# Patient Record
Sex: Female | Born: 1995
Health system: Southern US, Community
[De-identification: ages and names within clinical notes are randomized; demographics above are authoritative.]

## PROBLEM LIST (undated history)

## (undated) ENCOUNTER — Inpatient Hospital Stay (HOSPITAL_COMMUNITY): Payer: Self-pay

## (undated) VITALS — BP 106/69 | HR 102 | Temp 98.3°F | Resp 16 | Ht 63.0 in | Wt 128.7 lb

## (undated) DIAGNOSIS — F32A Depression, unspecified: Secondary | ICD-10-CM

## (undated) DIAGNOSIS — D649 Anemia, unspecified: Secondary | ICD-10-CM

## (undated) DIAGNOSIS — J302 Other seasonal allergic rhinitis: Secondary | ICD-10-CM

## (undated) DIAGNOSIS — G43909 Migraine, unspecified, not intractable, without status migrainosus: Secondary | ICD-10-CM

## (undated) DIAGNOSIS — K900 Celiac disease: Secondary | ICD-10-CM

## (undated) DIAGNOSIS — L723 Sebaceous cyst: Secondary | ICD-10-CM

## (undated) DIAGNOSIS — J0301 Acute recurrent streptococcal tonsillitis: Secondary | ICD-10-CM

## (undated) DIAGNOSIS — N809 Endometriosis, unspecified: Secondary | ICD-10-CM

## (undated) DIAGNOSIS — H539 Unspecified visual disturbance: Secondary | ICD-10-CM

## (undated) DIAGNOSIS — K589 Irritable bowel syndrome without diarrhea: Secondary | ICD-10-CM

## (undated) DIAGNOSIS — F419 Anxiety disorder, unspecified: Secondary | ICD-10-CM

## (undated) DIAGNOSIS — L732 Hidradenitis suppurativa: Secondary | ICD-10-CM

## (undated) DIAGNOSIS — F329 Major depressive disorder, single episode, unspecified: Secondary | ICD-10-CM

## (undated) DIAGNOSIS — K859 Acute pancreatitis without necrosis or infection, unspecified: Secondary | ICD-10-CM

## (undated) HISTORY — DX: Acute recurrent streptococcal tonsillitis: J03.01

## (undated) HISTORY — DX: Migraine, unspecified, not intractable, without status migrainosus: G43.909

## (undated) HISTORY — DX: Irritable bowel syndrome, unspecified: K58.9

## (undated) HISTORY — DX: Anemia, unspecified: D64.9

## (undated) HISTORY — DX: Other seasonal allergic rhinitis: J30.2

## (undated) HISTORY — PX: COLONOSCOPY: SHX174

---
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
## 2016-01-19 ENCOUNTER — Emergency Department (HOSPITAL_COMMUNITY)
Admission: EM | Admit: 2016-01-19 | Discharge: 2016-01-19 | Discharge status: Absent without leave | Payer: 59 | Source: Home / Self Care

## 2016-01-19 DIAGNOSIS — R42 Dizziness and giddiness: Secondary | ICD-10-CM | POA: Diagnosis not present

## 2016-01-19 DIAGNOSIS — R5383 Other fatigue: Secondary | ICD-10-CM | POA: Diagnosis not present

## 2016-02-06 DIAGNOSIS — Z113 Encounter for screening for infections with a predominantly sexual mode of transmission: Secondary | ICD-10-CM | POA: Diagnosis not present

## 2016-02-06 DIAGNOSIS — Z0001 Encounter for general adult medical examination with abnormal findings: Secondary | ICD-10-CM | POA: Diagnosis not present

## 2016-02-06 DIAGNOSIS — K5900 Constipation, unspecified: Secondary | ICD-10-CM | POA: Diagnosis not present

## 2016-02-06 DIAGNOSIS — Z118 Encounter for screening for other infectious and parasitic diseases: Secondary | ICD-10-CM | POA: Diagnosis not present

## 2016-02-06 DIAGNOSIS — G43909 Migraine, unspecified, not intractable, without status migrainosus: Secondary | ICD-10-CM | POA: Diagnosis not present

## 2016-02-06 DIAGNOSIS — F419 Anxiety disorder, unspecified: Secondary | ICD-10-CM | POA: Diagnosis not present

## 2016-02-06 DIAGNOSIS — D509 Iron deficiency anemia, unspecified: Secondary | ICD-10-CM | POA: Diagnosis not present

## 2016-02-28 ENCOUNTER — Ambulatory Visit: Payer: 59 | Admitting: Neurology

## 2016-02-28 ENCOUNTER — Telehealth: Payer: Self-pay | Admitting: Neurology

## 2016-02-28 NOTE — Telephone Encounter (Signed)
This patient did not show for a new patient appointment today. 

## 2016-02-29 ENCOUNTER — Encounter: Payer: Self-pay | Admitting: Neurology

## 2016-03-27 ENCOUNTER — Emergency Department (HOSPITAL_COMMUNITY): Payer: 59

## 2016-03-27 ENCOUNTER — Emergency Department (HOSPITAL_COMMUNITY)
Admission: EM | Admit: 2016-03-27 | Discharge: 2016-03-27 | Disposition: A | Discharge status: Home / Self Care | Payer: 59 | Attending: Emergency Medicine | Admitting: Emergency Medicine

## 2016-03-27 ENCOUNTER — Encounter (HOSPITAL_COMMUNITY): Payer: Self-pay | Admitting: Emergency Medicine

## 2016-03-27 DIAGNOSIS — R42 Dizziness and giddiness: Secondary | ICD-10-CM | POA: Diagnosis not present

## 2016-03-27 DIAGNOSIS — R079 Chest pain, unspecified: Secondary | ICD-10-CM | POA: Diagnosis not present

## 2016-03-27 DIAGNOSIS — Z8659 Personal history of other mental and behavioral disorders: Secondary | ICD-10-CM | POA: Insufficient documentation

## 2016-03-27 DIAGNOSIS — Z8669 Personal history of other diseases of the nervous system and sense organs: Secondary | ICD-10-CM | POA: Diagnosis not present

## 2016-03-27 DIAGNOSIS — Z79899 Other long term (current) drug therapy: Secondary | ICD-10-CM | POA: Diagnosis not present

## 2016-03-27 DIAGNOSIS — R000 Tachycardia, unspecified: Secondary | ICD-10-CM | POA: Insufficient documentation

## 2016-03-27 LAB — CBC
HCT: 36.8 % (ref 36.0–46.0)
HEMOGLOBIN: 11.3 g/dL — AB (ref 12.0–15.0)
MCH: 23.8 pg — AB (ref 26.0–34.0)
MCHC: 30.7 g/dL (ref 30.0–36.0)
MCV: 77.6 fL — AB (ref 78.0–100.0)
Platelets: 301 10*3/uL (ref 150–400)
RBC: 4.74 MIL/uL (ref 3.87–5.11)
RDW: 17.3 % — ABNORMAL HIGH (ref 11.5–15.5)
WBC: 4.2 10*3/uL (ref 4.0–10.5)

## 2016-03-27 LAB — BASIC METABOLIC PANEL
Anion gap: 14 (ref 5–15)
BUN: 11 mg/dL (ref 6–20)
CHLORIDE: 106 mmol/L (ref 101–111)
CO2: 20 mmol/L — AB (ref 22–32)
CREATININE: 1.02 mg/dL — AB (ref 0.44–1.00)
Calcium: 9.5 mg/dL (ref 8.9–10.3)
GFR calc non Af Amer: >60 mL/min (ref >60)
Glucose, Bld: 71 mg/dL (ref 65–99)
Potassium: 3.5 mmol/L (ref 3.5–5.1)
Sodium: 140 mmol/L (ref 135–145)

## 2016-03-27 LAB — I-STAT TROPONIN, ED: Troponin i, poc: 0 ng/mL (ref 0.00–0.08)

## 2016-03-27 LAB — TYPE AND SCREEN
ABO/RH(D): O POS
ANTIBODY SCREEN: NEGATIVE

## 2016-03-27 LAB — D-DIMER, QUANTITATIVE (NOT AT ARMC): D DIMER QUANT: 0.3 ug{FEU}/mL (ref 0.00–0.50)

## 2016-03-27 LAB — ABO/RH: ABO/RH(D): O POS

## 2016-03-27 MED ORDER — SODIUM CHLORIDE 0.9 % IV BOLUS (SEPSIS)
1000.0000 mL | Freq: Once | INTRAVENOUS | Status: AC
  Administered 2016-03-27: 1000 mL via INTRAVENOUS

## 2016-03-27 MED ORDER — LORAZEPAM 2 MG/ML IJ SOLN
0.5000 mg | Freq: Once | INTRAMUSCULAR | Status: AC
  Administered 2016-03-27: 0.5 mg via INTRAVENOUS
  Filled 2016-03-27: qty 1

## 2016-03-27 NOTE — Discharge Instructions (Signed)
All the results in the ER are normal, labs and imaging. We are not sure what is causing your symptoms. The workup in the ER is not complete, and is limited to screening for life threatening and emergent conditions only, so please see a primary care doctor for further evaluation.  Please return to the ER if you have worsening chest pain, shortness of breath, pain radiating to your jaw, shoulder, or back, sweats or fainting. Otherwise see the Cardiologist or your primary care doctor as requested.    Dizziness Dizziness is a common problem. It is a feeling of unsteadiness or light-headedness. You may feel like you are about to faint. Dizziness can lead to injury if you stumble or fall. Anyone can become dizzy, but dizziness is more common in older adults. This condition can be caused by a number of things, including medicines, dehydration, or illness. HOME CARE INSTRUCTIONS Taking these steps may help with your condition: Eating and Drinking  Drink enough fluid to keep your urine clear or pale yellow. This helps to keep you from becoming dehydrated. Try to drink more clear fluids, such as water.  Do not drink alcohol.  Limit your caffeine intake if directed by your health care provider.  Limit your salt intake if directed by your health care provider. Activity  Avoid making quick movements.  Rise slowly from chairs and steady yourself until you feel okay.  In the morning, first sit up on the side of the bed. When you feel okay, stand slowly while you hold onto something until you know that your balance is fine.  Move your legs often if you need to stand in one place for a long time. Tighten and relax your muscles in your legs while you are standing.  Do not drive or operate heavy machinery if you feel dizzy.  Avoid bending down if you feel dizzy. Place items in your home so that they are easy for you to reach without leaning over. Lifestyle  Do not use any tobacco products,  including cigarettes, chewing tobacco, or electronic cigarettes. If you need help quitting, ask your health care provider.  Try to reduce your stress level, such as with yoga or meditation. Talk with your health care provider if you need help. General Instructions  Watch your dizziness for any changes.  Take medicines only as directed by your health care provider. Talk with your health care provider if you think that your dizziness is caused by a medicine that you are taking.  Tell a friend or a family member that you are feeling dizzy. If he or she notices any changes in your behavior, have this person call your health care provider.  Keep all follow-up visits as directed by your health care provider. This is important. SEEK MEDICAL CARE IF:  Your dizziness does not go away.  Your dizziness or light-headedness gets worse.  You feel nauseous.  You have reduced hearing.  You have new symptoms.  You are unsteady on your feet or you feel like the room is spinning. SEEK IMMEDIATE MEDICAL CARE IF:  You vomit or have diarrhea and are unable to eat or drink anything.  You have problems talking, walking, swallowing, or using your arms, hands, or legs.  You feel generally weak.  You are not thinking clearly or you have trouble forming sentences. It may take a friend or family member to notice this.  You have chest pain, abdominal pain, shortness of breath, or sweating.  Your vision changes.  You  notice any bleeding.  You have a headache.  You have neck pain or a stiff neck.  You have a fever.   This information is not intended to replace advice given to you by your health care provider. Make sure you discuss any questions you have with your health care provider.   Document Released: 05/01/2001 Document Revised: 03/22/2015 Document Reviewed: 11/01/2014 Elsevier Interactive Patient Education [employer]

## 2016-03-27 NOTE — ED Notes (Signed)
Pt verbalized understanding of d/c instructions and follow-up care. No further questions/concerns, VSS, ambulatory w/ steady gait (refused wheelchair) 

## 2016-03-27 NOTE — ED Notes (Signed)
Pt at xray

## 2016-03-27 NOTE — ED Notes (Signed)
Pt now endorses Cp yesterday and SOB today.

## 2016-03-27 NOTE — ED Provider Notes (Signed)
CSN: [id number]     Arrival date & time 03/27/16  1204 History   First MD Initiated Contact with Patient 03/27/16 1235     Chief Complaint  Patient presents with  . Dizziness     (Consider location/radiation/quality/duration/timing/severity/associated sxs/prior Treatment) Patient is a 20 y.o. female presenting with dizziness.  Dizziness Quality:  Lightheadedness Severity:  Moderate Onset quality:  Gradual Duration:  2 days Timing:  Intermittent Chronicity:  New Relieved by:  None tried Worsened by:  Nothing Ineffective treatments:  None tried Associated symptoms: chest pain   Associated symptoms: no blood in stool, no nausea, no palpitations and no shortness of breath     Past Medical History  Diagnosis Date  . Allergy     Seasonal environmental allergies  . Headache(784.0)     daily for the past two weeks, 1-2 times weekly prior to that.  . Vision abnormalities     Myopia; patient has history of devloping ulcers when she wore contacts.  . Anxiety 01/28/2013  . Depression    History reviewed. No pertinent past surgical history. Family History  Problem Relation Age of Onset  . Depression Mother   . Migraines Mother   . Migraines Father    Social History  Substance Use Topics  . Smoking status: Never Smoker   . Smokeless tobacco: Never Used  . Alcohol Use: No   OB History    No data available     Review of Systems  Respiratory: Negative for shortness of breath.   Cardiovascular: Positive for chest pain. Negative for palpitations and leg swelling.  Gastrointestinal: Negative for nausea and blood in stool.  Genitourinary: Negative for flank pain and enuresis.  Neurological: Positive for dizziness.  All other systems reviewed and are negative.     Allergies  Review of patient's allergies indicates no known allergies.  Home Medications   Prior to Admission medications   Medication Sig Start Date End Date Taking? Authorizing Provider  ferrous sulfate 325  (65 FE) MG tablet Take 1 tablet (325 mg total) by mouth daily with breakfast. 02/03/13  Yes Jolene SchimkeKim B Winson, NP  ibuprofen (ADVIL,MOTRIN) 600 MG tablet Take 1 tablet (600 mg total) by mouth every 6 (six) hours as needed for cramping. 08/12/14  Yes Morton StallElyse Smith, MD   BP 116/58 mmHg  Pulse 114  Temp(Src) 98.2 F (36.8 C) (Oral)  Resp 23  SpO2 100%  LMP 03/27/2016 Physical Exam  Constitutional: She is oriented to person, place, and time. She appears well-developed and well-nourished.  HENT:  Head: Normocephalic and atraumatic.  Neck: Normal range of motion.  Cardiovascular: Regular rhythm.  Tachycardia present.   Pulmonary/Chest: Effort normal. No stridor. No respiratory distress. She has no wheezes.  Abdominal: Soft. She exhibits no distension.  Musculoskeletal: Normal range of motion. She exhibits no edema.  Neurological: She is alert and oriented to person, place, and time. No cranial nerve deficit.  No altered mental status, able to give full seemingly accurate history.  Face is symmetric, EOM's intact, pupils equal and reactive, vision intact, tongue and uvula midline without deviation Upper and Lower extremity motor 5/5, intact pain perception in distal extremities, 2+ reflexes in biceps, patella and achilles tendons. Finger to nose normal, heel to shin normal. Walks without assistance or evident ataxia.   Skin: Skin is warm and dry.  Nursing note and vitals reviewed.   ED Course  Procedures (including critical care time) Labs Review Labs Reviewed  BASIC METABOLIC PANEL - Abnormal; Notable for the  following:    CO2 20 (*)    Creatinine, Ser 1.02 (*)    All other components within normal limits  CBC - Abnormal; Notable for the following:    Hemoglobin 11.3 (*)    MCV 77.6 (*)    MCH 23.8 (*)    RDW 17.3 (*)    All other components within normal limits  D-DIMER, QUANTITATIVE (NOT AT [facility])  I-STAT TROPOININ, ED  TYPE AND SCREEN  [facility]    Imaging Review Dg Chest 2  View  03/27/2016  CLINICAL DATA:  Chest Pain yesterday ; she randomly gets SOB EXAM: CHEST - 2 VIEW COMPARISON:  None available FINDINGS: Lungs are clear. Heart size and mediastinal contours are within normal limits. No effusion. Visualized skeletal structures are unremarkable.  Nipple hardware. IMPRESSION: No acute cardiopulmonary disease. Electronically Signed   By: Corlis Leak M.D.   On: 03/27/2016 12:50   I have personally reviewed and evaluated these images and lab results as part of my medical decision-making.   EKG Interpretation   Date/Time:  Tuesday Mar 27 2016 12:14:05 EDT Ventricular Rate:  150 PR Interval:  114 QRS Duration: 64 QT Interval:  266 QTC Calculation: 420 R Axis:   88 Text Interpretation:  Sinus tachycardia Nonspecific ST and T wave  abnormality Abnormal ECG Confirmed by [facility] MD, Barbara Cower [phone]) on 03/27/2016  12:35:06 PM      MDM   Final diagnoses:  None   Light headedness. Likely 2/2 dehydration v anxiety. Will treat both and reevaluate.   Persistent tachycardia, will add on d dimer.     Marily Memos, MD 03/28/16 1055

## 2016-03-27 NOTE — ED Notes (Signed)
Pt c/o lightheaded and dizziness since yesterday. Pt states shes anemic. Pt HR is 120 at this time, ambulatory in triage.

## 2016-06-13 ENCOUNTER — Encounter: Payer: Self-pay | Admitting: Pediatrics

## 2016-06-14 ENCOUNTER — Encounter: Payer: Self-pay | Admitting: Pediatrics

## 2016-07-26 DIAGNOSIS — R079 Chest pain, unspecified: Secondary | ICD-10-CM | POA: Diagnosis not present

## 2016-07-26 DIAGNOSIS — N946 Dysmenorrhea, unspecified: Secondary | ICD-10-CM | POA: Diagnosis not present

## 2016-07-26 DIAGNOSIS — Z118 Encounter for screening for other infectious and parasitic diseases: Secondary | ICD-10-CM | POA: Diagnosis not present

## 2016-07-26 DIAGNOSIS — D649 Anemia, unspecified: Secondary | ICD-10-CM | POA: Diagnosis not present

## 2016-07-26 DIAGNOSIS — R51 Headache: Secondary | ICD-10-CM | POA: Diagnosis not present

## 2016-07-26 MED FILL — AMITRIPTYLINE HCL 10 MG TAB: 10 | 30 days supply | Qty: 60 | Fill #0

## 2016-07-26 MED FILL — IBUPROFEN 600 MG TABLET: 600 | 30 days supply | Qty: 90 | Fill #0

## 2016-07-31 MED FILL — AZITHROMYCIN 500 MG TABLET: 500 | 1 days supply | Qty: 2 | Fill #0

## 2016-08-28 ENCOUNTER — Ambulatory Visit: Payer: 59 | Admitting: Neurology

## 2016-08-28 ENCOUNTER — Telehealth: Payer: Self-pay | Admitting: *Deleted

## 2016-08-28 NOTE — Telephone Encounter (Signed)
No showed new patient appointment. 

## 2016-08-29 ENCOUNTER — Encounter: Payer: Self-pay | Admitting: Neurology

## 2016-12-12 DIAGNOSIS — J069 Acute upper respiratory infection, unspecified: Secondary | ICD-10-CM | POA: Diagnosis not present

## 2016-12-12 MED FILL — OSELTAMIVIR PHOS 75 MG CAP: 75 | 5 days supply | Qty: 10 | Fill #0

## 2016-12-17 DIAGNOSIS — R0789 Other chest pain: Secondary | ICD-10-CM | POA: Diagnosis not present

## 2016-12-17 DIAGNOSIS — J029 Acute pharyngitis, unspecified: Secondary | ICD-10-CM | POA: Diagnosis not present

## 2016-12-17 MED FILL — AZITHROMYCIN 250 MG TABLET: 250 | 5 days supply | Qty: 6 | Fill #0

## 2016-12-17 MED FILL — IBUPROFEN 800 MG TABLET: 800 | 15 days supply | Qty: 45 | Fill #0

## 2016-12-18 DIAGNOSIS — R05 Cough: Secondary | ICD-10-CM | POA: Diagnosis not present

## 2016-12-18 DIAGNOSIS — J029 Acute pharyngitis, unspecified: Secondary | ICD-10-CM | POA: Diagnosis not present

## 2016-12-18 DIAGNOSIS — B349 Viral infection, unspecified: Secondary | ICD-10-CM | POA: Diagnosis not present

## 2016-12-18 MED FILL — predniSONE 20 MG TABS: 20 | 5 days supply | Qty: 10 | Fill #0

## 2016-12-18 MED FILL — IPRATROPIUM 0.06% SPRAY: 0.06 | 20 days supply | Qty: 15 | Fill #0

## 2016-12-18 MED FILL — MAGIC MOUTHWASH 1:1:1: 10 days supply | Qty: 180 | Fill #0

## 2016-12-18 MED FILL — BROMIPHENIR-PSEUDOEPHED-DM: 30-2-10 | 5 days supply | Qty: 180 | Fill #0

## 2017-01-01 DIAGNOSIS — R197 Diarrhea, unspecified: Secondary | ICD-10-CM | POA: Diagnosis not present

## 2017-01-01 DIAGNOSIS — K625 Hemorrhage of anus and rectum: Secondary | ICD-10-CM | POA: Diagnosis not present

## 2017-01-18 MED FILL — GAVILYTE-N SOLUTION: 420 | 1 days supply | Qty: 4000 | Fill #0

## 2017-01-21 DIAGNOSIS — K640 First degree hemorrhoids: Secondary | ICD-10-CM | POA: Diagnosis not present

## 2017-01-21 DIAGNOSIS — R197 Diarrhea, unspecified: Secondary | ICD-10-CM | POA: Diagnosis not present

## 2017-01-21 DIAGNOSIS — K625 Hemorrhage of anus and rectum: Secondary | ICD-10-CM | POA: Diagnosis not present

## 2017-03-14 DIAGNOSIS — B338 Other specified viral diseases: Secondary | ICD-10-CM | POA: Diagnosis not present

## 2017-03-14 DIAGNOSIS — R509 Fever, unspecified: Secondary | ICD-10-CM | POA: Diagnosis not present

## 2017-03-21 DIAGNOSIS — N760 Acute vaginitis: Secondary | ICD-10-CM | POA: Diagnosis not present

## 2017-03-21 DIAGNOSIS — Z113 Encounter for screening for infections with a predominantly sexual mode of transmission: Secondary | ICD-10-CM | POA: Diagnosis not present

## 2017-03-21 DIAGNOSIS — Z6822 Body mass index (BMI) 22.0-22.9, adult: Secondary | ICD-10-CM | POA: Diagnosis not present

## 2017-03-21 DIAGNOSIS — Z01419 Encounter for gynecological examination (general) (routine) without abnormal findings: Secondary | ICD-10-CM | POA: Diagnosis not present

## 2017-03-21 MED FILL — metroNIDAZOLE 0.75 % GEL: 0.75 | 5 days supply | Qty: 70 | Fill #0

## 2017-03-21 MED FILL — AZITHROMYCIN 500 MG TABLET: 500 | 1 days supply | Qty: 2 | Fill #0

## 2017-03-21 MED FILL — FLUCONAZOLE 150 MG TABLET: 150 | 1 days supply | Qty: 1 | Fill #0

## 2017-06-28 DIAGNOSIS — J029 Acute pharyngitis, unspecified: Secondary | ICD-10-CM | POA: Diagnosis not present

## 2017-06-28 DIAGNOSIS — J039 Acute tonsillitis, unspecified: Secondary | ICD-10-CM | POA: Diagnosis not present

## 2017-06-28 DIAGNOSIS — J020 Streptococcal pharyngitis: Secondary | ICD-10-CM | POA: Diagnosis not present

## 2017-06-28 MED FILL — AMOXICILLIN 875 MG TABLET: 875 | 10 days supply | Qty: 20 | Fill #0

## 2017-08-15 MED FILL — MAGIC MOUTHWASH W/LIDO 1:1: 5 days supply | Qty: 120 | Fill #0

## 2017-08-16 ENCOUNTER — Encounter: Payer: Self-pay | Admitting: Primary Care

## 2017-08-16 ENCOUNTER — Ambulatory Visit (INDEPENDENT_AMBULATORY_CARE_PROVIDER_SITE_OTHER): Payer: 59 | Admitting: Primary Care

## 2017-08-16 VITALS — BP 102/62 | HR 98 | Temp 98.2°F | Ht 63.25 in | Wt 123.8 lb

## 2017-08-16 DIAGNOSIS — F411 Generalized anxiety disorder: Secondary | ICD-10-CM

## 2017-08-16 DIAGNOSIS — J029 Acute pharyngitis, unspecified: Secondary | ICD-10-CM

## 2017-08-16 DIAGNOSIS — F322 Major depressive disorder, single episode, severe without psychotic features: Secondary | ICD-10-CM

## 2017-08-16 DIAGNOSIS — G43701 Chronic migraine without aura, not intractable, with status migrainosus: Secondary | ICD-10-CM | POA: Diagnosis not present

## 2017-08-16 LAB — POCT RAPID STREP A (OFFICE): Rapid Strep A Screen: NEGATIVE

## 2017-08-16 MED ORDER — METHYLPREDNISOLONE ACETATE 80 MG/ML IJ SUSP
80.0000 mg | Freq: Once | INTRAMUSCULAR | Status: AC
  Administered 2017-08-16: 80 mg via INTRAMUSCULAR

## 2017-08-16 NOTE — Assessment & Plan Note (Signed)
Diagnosed at age 21, didn't take Lexapro very long.  Overall manages well,. Discussed to notify if symptoms progress.  

## 2017-08-16 NOTE — Assessment & Plan Note (Signed)
Diagnosed at age 21, didn't take Lexapro very long.  Overall manages well,. Discussed to notify if symptoms progress.

## 2017-08-16 NOTE — Progress Notes (Signed)
Subjective:    Patient ID: Stephanie [name], female    DOB: 1996/06/18, 21 y.o.   MRN: [id number]  HPI  Stephanie [name] is a 21 year old female who presents today to establish care and discuss the problems mentioned below. Will obtain old records.  1) Major Depressive Disorder/GAD: Diagnosed years ago at age 20. She was once managed on Lexapro but didn't like the way it made her feel so she stopped taking it. Overall she manages well, but does experience both anxiety and depression symptoms.   2) Migraine Headaches: Diagnosed at age 81. Experiences migraines two to three times weekly on average. She will wake up with a headache which will last throughout the day. She will experience photophobia and nausea with migraines. She will take Ibuprofen which sometimes helps. She thinks she was once on medications to prevent medications, but is not sure.   3) Sore Throat: History of recurrent strep pharyngitis with tonsillitis. Symptoms of difficulty swallowing, body aches, headaches, fever of 100, night sweats, cough, weight loss of 10 pounds due to inability to swallow that began 2 days ago. She was treated at her [facility] who gave her Magic Mouthwash and Ibuprofen. She started to develop an appetite and was able to eat yesterday. Overall her throat has improved.   Review of Systems     Past Medical History:  Diagnosis Date  . Anxiety 01/28/2013  . Depression   . IBS (irritable bowel syndrome)   . Migraines    daily for the past two weeks, 1-2 times weekly prior to that.  . Recurrent streptococcal tonsillitis   . Seasonal allergies    Seasonal environmental allergies  . Vision abnormalities    Myopia; patient has history of devloping ulcers when she wore contacts.     Social History   Social History  . Marital status: Single    Spouse name: N/A  . Number of children: N/A  . Years of education: N/A   Occupational History  . Not on file.   Social History Main Topics  .  Smoking status: Never Smoker  . Smokeless tobacco: Never Used  . Alcohol use No  . Drug use: No  . Sexual activity: Yes    Partners: Male    Birth control/ protection: Implant     Comment: broke up with first sexual partner 1 month ago   Other Topics Concern  . Not on file   Social History Narrative   Single.    Student at [employer].   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    Enjoys listening to music, hanging out with friends.     No past surgical history on file.  Family History  Problem Relation Age of Onset  . Depression Mother   . Migraines Mother   . Migraines Father     No Known Allergies  Current Outpatient Prescriptions on File Prior to Visit  Medication Sig Dispense Refill  . ibuprofen (ADVIL,MOTRIN) 600 MG tablet Take 1 tablet (600 mg total) by mouth every 6 (six) hours as needed for cramping. 30 tablet 0   No current facility-administered medications on file prior to visit.     BP 102/62   Pulse 98   Temp 98.2 F (36.8 C) (Oral)   Ht 5' 3.25" (1.607 m)   Wt 123 lb 12.8 oz (56.2 kg)   LMP 07/29/2017   SpO2 99%   BMI 21.76 kg/m    Objective:   Physical Exam  Constitutional: She appears  well-nourished.  HENT:  Right Ear: Tympanic membrane and ear canal normal.  Left Ear: Tympanic membrane and ear canal normal.  Nose: Right sinus exhibits no maxillary sinus tenderness and no frontal sinus tenderness. Left sinus exhibits no maxillary sinus tenderness and no frontal sinus tenderness.  Mouth/Throat: Posterior oropharyngeal edema and posterior oropharyngeal erythema present. No oropharyngeal exudate.  Eyes: Conjunctivae are normal.  Neck: Neck supple.  Cardiovascular: Normal rate and regular rhythm.   Pulmonary/Chest: Effort normal and breath sounds normal. She has no wheezes. She has no rales.  Lymphadenopathy:    She has no cervical adenopathy.  Skin: Skin is warm and dry.  Mildly diaphoretic          Assessment & Plan:  Viral  Pharyngitis:  Sore throat, throat swelling, low grade fever x 2 days. Exam today without obvious strep involvement. Rapid Strep: Negative. Overall is feeling better, vitals normal, appears well. Im Depo Medrol provided for throat inflammation. Continue Tylenol/Ibuprofen PRN.  Morrie Sheldon, NP

## 2017-08-16 NOTE — Addendum Note (Signed)
Addended by: Tawnya Crook on: 08/16/2017 10:27 AM   Modules accepted: Orders

## 2017-08-16 NOTE — Assessment & Plan Note (Signed)
Diagnosed at age 21, overall less frequent with migraines 2-3 times weekly. Overall able to manage. She will notify us if/when ready for preventative treatment.

## 2017-08-16 NOTE — Patient Instructions (Signed)
You were provided with an injection of steroids for throat swelling.  Start eating more meals that include healthy foods.  Increase vegetables, fruit, whole grains, lean protein. Start drinking a protein shake once daily for nutrients.  Ensure you are consuming 64 ounces of water daily.  Continue Ibuprofen/tylenol as needed for pain.  It was a pleasure to meet you today! Please don't hesitate to call me with any questions. Welcome to [facility]!

## 2017-08-28 ENCOUNTER — Ambulatory Visit: Payer: 59 | Admitting: Primary Care

## 2017-10-06 DIAGNOSIS — H5213 Myopia, bilateral: Secondary | ICD-10-CM | POA: Diagnosis not present

## 2017-10-15 DIAGNOSIS — L02422 Furuncle of left axilla: Secondary | ICD-10-CM | POA: Diagnosis not present

## 2017-10-17 DIAGNOSIS — Z3046 Encounter for surveillance of implantable subdermal contraceptive: Secondary | ICD-10-CM | POA: Diagnosis not present

## 2017-11-13 ENCOUNTER — Telehealth: Payer: Self-pay | Admitting: Primary Care

## 2017-11-13 NOTE — Telephone Encounter (Signed)
Copied from CRM [id number]. Topic: Quick Communication - See Telephone Encounter >> Nov 13, 2017 11:38 AM Stephanie [name], Rosey Batheresa D wrote: CRM for notification. See Telephone encounter for: 11/13/17. Patient needs a staff medical form sign and needs it done by today. Please call patient back if Clark or someone in the office can do it other then her pcp, thanks.

## 2017-11-13 NOTE — Telephone Encounter (Signed)
I left v/m for pt to cb to see what type form pt has.

## 2017-11-13 NOTE — Telephone Encounter (Signed)
Patient dropped off form.  Form is in rx tower.  When form is ready for pick up, please call patient back at [phone].

## 2017-11-13 NOTE — Telephone Encounter (Signed)
Noted  

## 2017-11-13 NOTE — Telephone Encounter (Signed)
Patient wanted form filled out as soon as possible.  [place] and she said she needed to see patient to fill out form.  Patient said she couldn't make it to appointment on Friday and patient took the form with her.

## 2017-11-13 NOTE — Telephone Encounter (Signed)
Pt states it is a staff medical form that states she is able to care for children, emotionally and physically. Form has about 5 questions on it. Advised pt she needs to bring the form to the office

## 2017-11-20 ENCOUNTER — Encounter: Payer: Self-pay | Admitting: Primary Care

## 2017-11-20 ENCOUNTER — Encounter: Payer: Self-pay | Admitting: *Deleted

## 2017-11-20 ENCOUNTER — Ambulatory Visit (INDEPENDENT_AMBULATORY_CARE_PROVIDER_SITE_OTHER): Payer: 59 | Admitting: Primary Care

## 2017-11-20 VITALS — BP 106/70 | HR 97 | Temp 98.0°F | Ht 63.25 in | Wt 127.8 lb

## 2017-11-20 DIAGNOSIS — F322 Major depressive disorder, single episode, severe without psychotic features: Secondary | ICD-10-CM

## 2017-11-20 DIAGNOSIS — F411 Generalized anxiety disorder: Secondary | ICD-10-CM

## 2017-11-20 DIAGNOSIS — Z0000 Encounter for general adult medical examination without abnormal findings: Secondary | ICD-10-CM

## 2017-11-20 LAB — COMPREHENSIVE METABOLIC PANEL
ALT: 12 U/L (ref 0–35)
AST: 18 U/L (ref 0–37)
Albumin: 4 g/dL (ref 3.5–5.2)
Alkaline Phosphatase: 33 U/L — ABNORMAL LOW (ref 39–117)
BUN: 10 mg/dL (ref 6–23)
CALCIUM: 9 mg/dL (ref 8.4–10.5)
CO2: 27 meq/L (ref 19–32)
Chloride: 105 mEq/L (ref 96–112)
Creatinine, Ser: 0.74 mg/dL (ref 0.40–1.20)
GFR: 127.42 mL/min (ref >60.00)
GLUCOSE: 88 mg/dL (ref 70–99)
Potassium: 3.9 mEq/L (ref 3.5–5.1)
Sodium: 137 mEq/L (ref 135–145)
Total Bilirubin: 0.4 mg/dL (ref 0.2–1.2)
Total Protein: 7 g/dL (ref 6.0–8.3)

## 2017-11-20 LAB — LIPID PANEL
Cholesterol: 132 mg/dL (ref 0–200)
HDL: 39.5 mg/dL (ref >39.00)
LDL Cholesterol: 83 mg/dL (ref 0–99)
NonHDL: 92.3
TRIGLYCERIDES: 48 mg/dL (ref 0.0–149.0)
Total CHOL/HDL Ratio: 3
VLDL: 9.6 mg/dL (ref 0.0–40.0)

## 2017-11-20 LAB — TSH: TSH: 1.17 u[IU]/mL (ref 0.35–5.50)

## 2017-11-20 MED ORDER — SERTRALINE HCL 25 MG PO TABS: 25.0000 mg | ORAL_TABLET | Freq: Every day | ORAL | 1 refills | Status: DC

## 2017-11-20 NOTE — Assessment & Plan Note (Signed)
PHQ 9 score of 15 today. Discussed options for treatment, she elects for medication.   Rx for Zoloft 25 mg sent to pharmacy. Patient is to take 1/2 tablet daily for 8 days, then advance to 1 full tablet thereafter. We discussed possible side effects of headache, GI upset, drowsiness, and SI/HI. If thoughts of SI/HI develop, we discussed to present to the emergency immediately. Patient verbalized understanding.   Follow up in 6 weeks for re-evaluation.

## 2017-11-20 NOTE — Patient Instructions (Signed)
Start sertraline 25 mg tablets for anxiety and depression. Start by taking 1/2 tablet daily for 8 days, then increase to 1 full tablet thereafter. Alcohol can cause increased depression, limit use when on this medication.  Increase consumption of vegetables, fruit, whole grains.  Ensure you are consuming 64 ounces of water daily.  Stop by the lab prior to leaving today. I will notify you of your results once received.   Follow up in 6 weeks for re-evaluation or sooner if needed.  It was a pleasure to see you today!

## 2017-11-20 NOTE — Progress Notes (Signed)
Subjective:    Patient ID: Stephanie [name], female    DOB: 12-05-1995, 21 y.o.   MRN: [id number]  HPI  Stephanie [name] is a 22 year old female who presents today for complete physical.  Immunizations: -Tetanus: Completed in 2009 -Influenza: Declines    Diet: She endorses a poor diet. Breakfast: Skips Lunch: Sometimes skips, fish sticks, tater tots, pasta, pizza Dinner: Sometimes skips, sandwich Snacks: Fruit, granola bar Desserts: None Beverages: Water, ensure  Exercise: She does not currently exercise Eye exam: Completed in 2018 Dental exam: Due now, she will schedule.  Pap Smear: Due in 2019, following with gynecology.   1) Anxiety and Depression: Long history of anxiety, thinks her anxiety has increased over the last one month. She's experienced symptoms of no motivation, feeling down, irritability, feeling on edge. GAD 7 score of 14 and PHQ 9 score of 15 today. She denies SI/HI. She doesn't feel that she has time for therapy.    Review of Systems  Constitutional: Negative for unexpected weight change.  HENT: Negative for rhinorrhea.   Respiratory: Negative for cough and shortness of breath.   Cardiovascular: Negative for chest pain.  Gastrointestinal: Negative for constipation and diarrhea.  Genitourinary: Negative for difficulty urinating and menstrual problem.  Musculoskeletal: Negative for arthralgias and myalgias.  Skin: Negative for rash.  Allergic/Immunologic: Negative for environmental allergies.  Neurological: Negative for dizziness, numbness and headaches.  Psychiatric/Behavioral:       See HPI       Past Medical History:  Diagnosis Date  . Anxiety 01/28/2013  . Depression   . IBS (irritable bowel syndrome)   . Migraines    daily for the past two weeks, 1-2 times weekly prior to that.  . Recurrent streptococcal tonsillitis   . Seasonal allergies    Seasonal environmental allergies  . Vision abnormalities    Myopia; patient has history of  devloping ulcers when she wore contacts.     Social History   Socioeconomic History  . Marital status: Single    Spouse name: Not on file  . Number of children: Not on file  . Years of education: Not on file  . Highest education level: Not on file  Social Needs  . Financial resource strain: Not on file  . Food insecurity - worry: Not on file  . Food insecurity - inability: Not on file  . Transportation needs - medical: Not on file  . Transportation needs - non-medical: Not on file  Occupational History  . Not on file  Tobacco Use  . Smoking status: Never Smoker  . Smokeless tobacco: Never Used  Substance and Sexual Activity  . Alcohol use: No  . Drug use: No  . Sexual activity: Yes    Partners: Male    Birth control/protection: Implant    Comment: broke up with first sexual partner 1 month ago  Other Topics Concern  . Not on file  Social History Narrative   Single.    Student at [employer].   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    Enjoys listening to music, hanging out with friends.     No past surgical history on file.  Family History  Problem Relation Age of Onset  . Depression Mother   . Migraines Mother   . Migraines Father     No Known Allergies  Current Outpatient Medications on File Prior to Visit  Medication Sig Dispense Refill  . ibuprofen (ADVIL,MOTRIN) 600 MG tablet Take 1 tablet (600 mg total)  by mouth every 6 (six) hours as needed for cramping. (Patient not taking: Reported on 11/20/2017) 30 tablet 0   No current facility-administered medications on file prior to visit.     BP 106/70   Pulse 97   Temp 98 F (36.7 C) (Oral)   Ht 5' 3.25" (1.607 m)   Wt 127 lb 12.8 oz (58 kg)   LMP 10/02/2017   SpO2 99%   BMI 22.46 kg/m    Objective:   Physical Exam  Constitutional: She is oriented to person, place, and time. She appears well-nourished.  HENT:  Right Ear: Tympanic membrane and ear canal normal.  Left Ear: Tympanic membrane and  ear canal normal.  Nose: Nose normal.  Mouth/Throat: Oropharynx is clear and moist.  Eyes: Conjunctivae and EOM are normal. Pupils are equal, round, and reactive to light.  Neck: Neck supple. No thyromegaly present.  Cardiovascular: Normal rate and regular rhythm.  No murmur heard. Pulmonary/Chest: Effort normal and breath sounds normal. She has no rales.  Abdominal: Soft. Bowel sounds are normal. There is no tenderness.  Musculoskeletal: Normal range of motion.  Lymphadenopathy:    She has no cervical adenopathy.  Neurological: She is alert and oriented to person, place, and time. She has normal reflexes. No cranial nerve deficit.  Skin: Skin is warm. No rash noted.  Mild sweating to underarms and abdomen.  Psychiatric: She has a normal mood and affect.          Assessment & Plan:

## 2017-11-20 NOTE — Assessment & Plan Note (Signed)
GAD 7 score of 14 today. Declines therapy. Discussed options for treatment, she elects for medication.   Rx for Zoloft 25 mg sent to pharmacy. Patient is to take 1/2 tablet daily for 8 days, then advance to 1 full tablet thereafter. We discussed possible side effects of headache, GI upset, drowsiness, and SI/HI. If thoughts of SI/HI develop, we discussed to present to the emergency immediately. Patient verbalized understanding.   Follow up in 6 weeks for re-evaluation.

## 2017-11-20 NOTE — Assessment & Plan Note (Signed)
Tdap UTD, declines influenza vaccination. Pap due in 2019, following with GYN. Discussed to increase vegetables, fruit, whole grains, water. Exam unremarkable. Labs pending. Follow up in 1 year for annual exam.

## 2017-11-25 ENCOUNTER — Encounter: Payer: 59 | Admitting: Primary Care

## 2017-11-29 MED FILL — SERTRALINE HCL 25 MG TABLET: 25 | 30 days supply | Qty: 30 | Fill #0

## 2017-12-05 ENCOUNTER — Ambulatory Visit: Payer: Self-pay

## 2017-12-05 NOTE — Telephone Encounter (Signed)
Pt. Will take missed dose now and then continue with regular schedule.

## 2018-01-01 ENCOUNTER — Ambulatory Visit: Payer: 59 | Admitting: Primary Care

## 2018-01-06 ENCOUNTER — Encounter: Payer: Self-pay | Admitting: Primary Care

## 2018-01-06 ENCOUNTER — Ambulatory Visit (INDEPENDENT_AMBULATORY_CARE_PROVIDER_SITE_OTHER): Payer: No Typology Code available for payment source | Admitting: Primary Care

## 2018-01-06 VITALS — BP 120/76 | HR 90 | Temp 98.3°F | Ht 63.25 in | Wt 125.8 lb

## 2018-01-06 DIAGNOSIS — Z01419 Encounter for gynecological examination (general) (routine) without abnormal findings: Secondary | ICD-10-CM

## 2018-01-06 DIAGNOSIS — F322 Major depressive disorder, single episode, severe without psychotic features: Secondary | ICD-10-CM

## 2018-01-06 DIAGNOSIS — F411 Generalized anxiety disorder: Secondary | ICD-10-CM | POA: Diagnosis not present

## 2018-01-06 MED ORDER — SERTRALINE HCL 25 MG PO TABS: 25.0000 mg | ORAL_TABLET | Freq: Every day | ORAL | 0 refills | Status: DC

## 2018-01-06 NOTE — Patient Instructions (Signed)
We sent refills for Zoloft to your pharmacy, continue 25 mg once daily.  Please update me in a few weeks as discussed.  It was a pleasure to see you today!

## 2018-01-06 NOTE — Assessment & Plan Note (Signed)
Overall improved on Zoloft 25 mg, does seem as though she may benefit on an increased dose. She would like to stick with 25 mg for now which is reasonable. She'll continue to monitor symptoms after reinitiating at the 25 mg dose and update as needed. Denies SI/HI. GAD 7 score of 7 today.

## 2018-01-06 NOTE — Progress Notes (Signed)
Subjective:    Patient ID: Stephanie [name], female    DOB: September 15, 1996, 22 y.o.   MRN: [id number]  HPI  [name] is a 22 year old female who presents today for follow up of anxiety and depression.   She was last evaluated in early January 2019 with complaints of increased anxiety, no motivation to do anything, feeling depressed. GAD 7 score of 14 and PHQ 9 score of 15 that day so she was initiated on Zoloft 25 mg once daily.   Since her last visit she's feeling better. Positive symptoms include improved mood, easier to get out of bed in the morning, more motivation to do things, less worry. Denies SI/HI.   She's been without Zoloft for one week as she ran out, she didn't realized she had an an extra refill. PHQ 9 score of 10 and GAD 7 score of 7 today. She started to notice a difference when off of the medication and realizes she feels better when taking.  Review of Systems  Gastrointestinal: Negative for abdominal pain and nausea.  Neurological: Negative for headaches.  Psychiatric/Behavioral: Negative for suicidal ideas.       See HPI       Past Medical History:  Diagnosis Date  . Anxiety 01/28/2013  . Depression   . IBS (irritable bowel syndrome)   . Migraines    daily for the past two weeks, 1-2 times weekly prior to that.  . Recurrent streptococcal tonsillitis   . Seasonal allergies    Seasonal environmental allergies  . Vision abnormalities    Myopia; patient has history of devloping ulcers when she wore contacts.     Social History   Socioeconomic History  . Marital status: Single    Spouse name: Not on file  . Number of children: Not on file  . Years of education: Not on file  . Highest education level: Not on file  Social Needs  . Financial resource strain: Not on file  . Food insecurity - worry: Not on file  . Food insecurity - inability: Not on file  . Transportation needs - medical: Not on file  . Transportation needs - non-medical: Not on file    Occupational History  . Not on file  Tobacco Use  . Smoking status: Never Smoker  . Smokeless tobacco: Never Used  Substance and Sexual Activity  . Alcohol use: No  . Drug use: No  . Sexual activity: Yes    Partners: Male    Birth control/protection: Implant    Comment: broke up with first sexual partner 1 month ago  Other Topics Concern  . Not on file  Social History Narrative   Single.    Student at [employer].   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    Enjoys listening to music, hanging out with friends.     No past surgical history on file.  Family History  Problem Relation Age of Onset  . Depression Mother   . Migraines Mother   . Migraines Father     No Known Allergies  Current Outpatient Medications on File Prior to Visit  Medication Sig Dispense Refill  . ibuprofen (ADVIL,MOTRIN) 600 MG tablet Take 1 tablet (600 mg total) by mouth every 6 (six) hours as needed for cramping. (Patient not taking: Reported on 11/20/2017) 30 tablet 0   No current facility-administered medications on file prior to visit.     BP 120/76   Pulse 90   Temp 98.3 F (36.8 C) (  Oral)   Ht 5' 3.25" (1.607 m)   Wt 125 lb 12.8 oz (57.1 kg)   LMP 11/30/2017   SpO2 99%   BMI 22.11 kg/m    Objective:   Physical Exam  Constitutional: She appears well-nourished.  Neck: Neck supple.  Cardiovascular: Normal rate and regular rhythm.  Pulmonary/Chest: Effort normal and breath sounds normal.  Skin: Skin is warm and dry.  Psychiatric: She has a normal mood and affect.          Assessment & Plan:

## 2018-01-06 NOTE — Assessment & Plan Note (Signed)
Overall improved on Zoloft 25 mg, does seem as though she may benefit on an increased dose. She would like to stick with 25 mg for now which is reasonable. She'll continue to monitor symptoms after reinitiating at the 25 mg dose and update as needed. Denies SI/HI. PHQ 9 score of 10 day.

## 2018-02-11 ENCOUNTER — Other Ambulatory Visit (HOSPITAL_COMMUNITY)
Admission: RE | Admit: 2018-02-11 | Discharge: 2018-02-11 | Disposition: A | Discharge status: Home / Self Care | Payer: No Typology Code available for payment source | Source: Ambulatory Visit | Attending: Family Medicine | Admitting: Family Medicine

## 2018-02-11 ENCOUNTER — Ambulatory Visit (INDEPENDENT_AMBULATORY_CARE_PROVIDER_SITE_OTHER): Payer: No Typology Code available for payment source | Admitting: Primary Care

## 2018-02-11 VITALS — BP 120/70 | HR 93 | Temp 98.0°F | Ht 63.25 in | Wt 125.8 lb

## 2018-02-11 DIAGNOSIS — Z124 Encounter for screening for malignant neoplasm of cervix: Secondary | ICD-10-CM | POA: Insufficient documentation

## 2018-02-11 DIAGNOSIS — Z113 Encounter for screening for infections with a predominantly sexual mode of transmission: Secondary | ICD-10-CM | POA: Diagnosis not present

## 2018-02-11 DIAGNOSIS — R102 Pelvic and perineal pain: Secondary | ICD-10-CM | POA: Diagnosis not present

## 2018-02-11 LAB — POC URINALSYSI DIPSTICK (AUTOMATED)
Bilirubin, UA: NEGATIVE
Glucose, UA: NEGATIVE
Ketones, UA: NEGATIVE
Leukocytes, UA: NEGATIVE
Nitrite, UA: NEGATIVE
Spec Grav, UA: >=1.03 — AB (ref 1.010–1.025)
Urobilinogen, UA: NEGATIVE E.U./dL — AB
pH, UA: 6 (ref 5.0–8.0)

## 2018-02-11 LAB — COMPREHENSIVE METABOLIC PANEL
ALBUMIN: 4.2 g/dL (ref 3.5–5.2)
ALK PHOS: 36 U/L — AB (ref 39–117)
ALT: 13 U/L (ref 0–35)
AST: 18 U/L (ref 0–37)
BUN: 14 mg/dL (ref 6–23)
CALCIUM: 9.3 mg/dL (ref 8.4–10.5)
CO2: 25 mEq/L (ref 19–32)
Chloride: 104 mEq/L (ref 96–112)
Creatinine, Ser: 0.81 mg/dL (ref 0.40–1.20)
GFR: 114.55 mL/min (ref >60.00)
Glucose, Bld: 105 mg/dL — ABNORMAL HIGH (ref 70–99)
POTASSIUM: 4.4 meq/L (ref 3.5–5.1)
Sodium: 136 mEq/L (ref 135–145)
TOTAL PROTEIN: 7.5 g/dL (ref 6.0–8.3)
Total Bilirubin: 0.4 mg/dL (ref 0.2–1.2)

## 2018-02-11 LAB — CBC WITH DIFFERENTIAL/PLATELET
Basophils Absolute: 0 10*3/uL (ref 0.0–0.1)
Basophils Relative: 0.5 % (ref 0.0–3.0)
Eosinophils Absolute: 0 10*3/uL (ref 0.0–0.7)
Eosinophils Relative: 0.8 % (ref 0.0–5.0)
HCT: 40 % (ref 36.0–46.0)
Hemoglobin: 13.2 g/dL (ref 12.0–15.0)
Lymphocytes Relative: 34.8 % (ref 12.0–46.0)
Lymphs Abs: 1.3 10*3/uL (ref 0.7–4.0)
MCHC: 33.1 g/dL (ref 30.0–36.0)
MCV: 82.6 fl (ref 78.0–100.0)
Monocytes Absolute: 0.2 10*3/uL (ref 0.1–1.0)
Monocytes Relative: 5.5 % (ref 3.0–12.0)
Neutro Abs: 2.3 10*3/uL (ref 1.4–7.7)
Neutrophils Relative %: 58.4 % (ref 43.0–77.0)
Platelets: 295 10*3/uL (ref 150.0–400.0)
RBC: 4.84 Mil/uL (ref 3.87–5.11)
RDW: 16 % — ABNORMAL HIGH (ref 11.5–15.5)
WBC: 3.9 10*3/uL — ABNORMAL LOW (ref 4.0–10.5)

## 2018-02-11 LAB — POCT URINE PREGNANCY: PREG TEST UR: NEGATIVE

## 2018-02-11 NOTE — Patient Instructions (Signed)
Stop by the lab prior to leaving today. I will notify you of your results once received.   We will be in touch once we receive your vaginal swab results.   It was a pleasure to see you today!

## 2018-02-11 NOTE — Progress Notes (Signed)
Subjective:    Patient ID: Stephanie [name], female    DOB: Aug 18, 1996, 22 y.o.   MRN: [id number]  HPI  Ms. Epperly is a 22 year old female with a history of menorrhagia who presents today with a chief complaint of dysmenorrhea.   She has a history of painful menstrual cycles, but two days ago she noticed severe pelvic cramping, lower back pain, hip pain, lower extremity pain, nausea. Her menstrual cycle began 02/08/18.  She's never had cramping this bad in the past. She took large doses and amounts of Ibuprofen yesterday without improvement.   She is sexually active and uses protection with condoms, same partner. She has noticed intermittent painful intercourse with lower back pain. She denies vaginal discharge, vaginal itching.    Review of Systems  Constitutional: Negative for fever.  Respiratory: Negative for shortness of breath.   Cardiovascular: Negative for chest pain.  Gastrointestinal: Positive for nausea.  Genitourinary: Positive for pelvic pain. Negative for dysuria, frequency, hematuria and vaginal discharge.       Past Medical History:  Diagnosis Date  . Anxiety 01/28/2013  . Depression   . IBS (irritable bowel syndrome)   . Migraines    daily for the past two weeks, 1-2 times weekly prior to that.  . Recurrent streptococcal tonsillitis   . Seasonal allergies    Seasonal environmental allergies  . Vision abnormalities    Myopia; patient has history of devloping ulcers when she wore contacts.     Social History   Socioeconomic History  . Marital status: Single    Spouse name: Not on file  . Number of children: Not on file  . Years of education: Not on file  . Highest education level: Not on file  Occupational History  . Not on file  Social Needs  . Financial resource strain: Not on file  . Food insecurity:    Worry: Not on file    Inability: Not on file  . Transportation needs:    Medical: Not on file    Non-medical: Not on file  Tobacco Use  .  Smoking status: Never Smoker  . Smokeless tobacco: Never Used  Substance and Sexual Activity  . Alcohol use: No  . Drug use: No  . Sexual activity: Yes    Partners: Male    Birth control/protection: Implant    Comment: broke up with first sexual partner 1 month ago  Lifestyle  . Physical activity:    Days per week: Not on file    Minutes per session: Not on file  . Stress: Not on file  Relationships  . Social connections:    Talks on phone: Not on file    Gets together: Not on file    Attends religious service: Not on file    Active member of club or organization: Not on file    Attends meetings of clubs or organizations: Not on file    Relationship status: Not on file  . Intimate partner violence:    Fear of current or ex partner: Not on file    Emotionally abused: Not on file    Physically abused: Not on file    Forced sexual activity: Not on file  Other Topics Concern  . Not on file  Social History Narrative   Single.    Student at [employer].   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    Enjoys listening to music, hanging out with friends.     No past surgical history  on file.  Family History  Problem Relation Age of Onset  . Depression Mother   . Migraines Mother   . Migraines Father     No Known Allergies  Current Outpatient Medications on File Prior to Visit  Medication Sig Dispense Refill  . ibuprofen (ADVIL,MOTRIN) 200 MG tablet Take 200 mg by mouth every 6 (six) hours as needed.    . sertraline (ZOLOFT) 25 MG tablet Take 1 tablet (25 mg total) by mouth daily. 90 tablet 0   No current facility-administered medications on file prior to visit.     BP 120/70   Pulse 93   Temp 98 F (36.7 C) (Oral)   Ht 5' 3.25" (1.607 m)   Wt 125 lb 12 oz (57 kg)   LMP 02/08/2018   SpO2 99%   BMI 22.10 kg/m    Objective:   Physical Exam  Constitutional: She appears well-nourished. She does not appear ill.  Neck: Neck supple.  Cardiovascular: Normal rate  and regular rhythm.  Pulmonary/Chest: Effort normal and breath sounds normal.  Abdominal: Soft. Normal appearance and bowel sounds are normal. There is tenderness in the right upper quadrant and left upper quadrant. There is no CVA tenderness.  Genitourinary: There is no tenderness or lesion on the right labia. There is no tenderness or lesion on the left labia. Cervix exhibits discharge. There is bleeding in the vagina. No vaginal discharge found.  Genitourinary Comments: Mild vaginal bleeding, on menstrual cycle  Skin: Skin is warm and dry.          Assessment & Plan:  Pelvic Pain with Menstrual Cycle:  Increased menstrual like cramping with recent menstrual cycle, other symptoms as well. Exam overall stable, doesn't appear to be in distress.  UA today: Negative. Urine pregnancy: Negative Pelvic exam completed, wet prep and STD screening pending. Pap smear completed as she's never had.  Check CBC, CMP, HIV, RPR, HSV.  Doreene NestKatherine K Torez Beauregard, NP

## 2018-02-12 ENCOUNTER — Other Ambulatory Visit: Payer: Self-pay | Admitting: Primary Care

## 2018-02-12 DIAGNOSIS — B9689 Other specified bacterial agents as the cause of diseases classified elsewhere: Secondary | ICD-10-CM

## 2018-02-12 DIAGNOSIS — N760 Acute vaginitis: Principal | ICD-10-CM

## 2018-02-12 LAB — CYTOLOGY - PAP: DIAGNOSIS: NEGATIVE

## 2018-02-12 MED ORDER — METRONIDAZOLE 500 MG PO TABS: 500.0000 mg | ORAL_TABLET | Freq: Two times a day (BID) | ORAL | 0 refills | Status: DC

## 2018-02-14 LAB — RPR: RPR Ser Ql: NONREACTIVE

## 2018-02-14 LAB — C. TRACHOMATIS/N. GONORRHOEAE RNA
C. trachomatis RNA, TMA: NOT DETECTED
N. gonorrhoeae RNA, TMA: NOT DETECTED

## 2018-02-14 LAB — WET PREP BY MOLECULAR PROBE
CANDIDA SPECIES: NOT DETECTED
MICRO NUMBER:: 90376263
SPECIMEN QUALITY:: ADEQUATE
Trichomonas vaginosis: NOT DETECTED

## 2018-02-14 LAB — HSV 1/2 AB (IGM), IFA W/RFLX TITER
HSV 1 IGM SCREEN: NEGATIVE
HSV 2 IGM SCREEN: NEGATIVE

## 2018-02-14 LAB — HSV(HERPES SIMPLEX VRS) I + II AB-IGG
HAV 1 IGG,TYPE SPECIFIC AB: 12.4 index — ABNORMAL HIGH
HSV 2 IGG,TYPE SPECIFIC AB: <0.9 index

## 2018-02-14 LAB — HIV ANTIBODY (ROUTINE TESTING W REFLEX): HIV 1&2 Ab, 4th Generation: NONREACTIVE

## 2018-05-26 ENCOUNTER — Telehealth: Payer: Self-pay | Admitting: *Deleted

## 2018-05-26 DIAGNOSIS — N921 Excessive and frequent menstruation with irregular cycle: Secondary | ICD-10-CM

## 2018-05-26 NOTE — Telephone Encounter (Signed)
Referral placed.

## 2018-05-26 NOTE — Telephone Encounter (Signed)
Copied from CRM [phone]. Topic: Referral - Request >> May 26, 2018  8:44 AM [name], Jennifer S wrote: Reason for CRM: Requesting referral to GYN, [facility] for heavy menstrual. Insurance requires referral.

## 2018-05-28 ENCOUNTER — Telehealth: Payer: Self-pay

## 2018-05-28 NOTE — Telephone Encounter (Signed)
Copied from CRM [phone]. Topic: Referral - Request >> May 28, 2018  9:17 AM [name], Jennifer S wrote: Reason for CRM: Checking status of referral to GYN

## 2018-05-28 NOTE — Telephone Encounter (Signed)
GYN Appt made and patient aware.

## 2018-06-06 ENCOUNTER — Encounter (HOSPITAL_COMMUNITY): Payer: Self-pay

## 2018-06-06 ENCOUNTER — Ambulatory Visit: Payer: Self-pay | Admitting: *Deleted

## 2018-06-06 ENCOUNTER — Ambulatory Visit (HOSPITAL_COMMUNITY)
Admission: EM | Admit: 2018-06-06 | Discharge: 2018-06-06 | Disposition: A | Discharge status: Home / Self Care | Payer: No Typology Code available for payment source | Attending: Internal Medicine | Admitting: Internal Medicine

## 2018-06-06 DIAGNOSIS — Z3202 Encounter for pregnancy test, result negative: Secondary | ICD-10-CM

## 2018-06-06 DIAGNOSIS — R1084 Generalized abdominal pain: Secondary | ICD-10-CM

## 2018-06-06 DIAGNOSIS — K921 Melena: Secondary | ICD-10-CM | POA: Diagnosis not present

## 2018-06-06 LAB — POCT URINALYSIS DIP (DEVICE)
Bilirubin Urine: NEGATIVE
Glucose, UA: NEGATIVE mg/dL
KETONES UR: NEGATIVE mg/dL
Leukocytes, UA: NEGATIVE
Nitrite: NEGATIVE
PH: 6 (ref 5.0–8.0)
PROTEIN: NEGATIVE mg/dL
SPECIFIC GRAVITY, URINE: 1.02 (ref 1.005–1.030)
Urobilinogen, UA: 0.2 mg/dL (ref 0.0–1.0)

## 2018-06-06 LAB — POCT PREGNANCY, URINE: Preg Test, Ur: NEGATIVE

## 2018-06-06 LAB — POCT I-STAT, CHEM 8
BUN: 8 mg/dL (ref 6–20)
Calcium, Ion: 1.22 mmol/L (ref 1.15–1.40)
Chloride: 103 mmol/L (ref 98–111)
Creatinine, Ser: 0.9 mg/dL (ref 0.44–1.00)
GLUCOSE: 100 mg/dL — AB (ref 70–99)
HEMATOCRIT: 39 % (ref 36.0–46.0)
HEMOGLOBIN: 13.3 g/dL (ref 12.0–15.0)
Potassium: 3.9 mmol/L (ref 3.5–5.1)
Sodium: 140 mmol/L (ref 135–145)
TCO2: 25 mmol/L (ref 22–32)

## 2018-06-06 LAB — OCCULT BLOOD, POC DEVICE: FECAL OCCULT BLD: NEGATIVE

## 2018-06-06 MED ORDER — DICYCLOMINE HCL 20 MG PO TABS: 20.0000 mg | ORAL_TABLET | Freq: Two times a day (BID) | ORAL | 0 refills | Status: DC

## 2018-06-06 NOTE — Discharge Instructions (Addendum)
It was nice meeting you!!  Your abdominal exam and rectal exam were completely normal. Your blood work was normal no signs of anemia. Your vital signs were within normal limits. This could be related to multiple different issues as we discussed.  I do not believe this is anything that is warranting a ER visit or is life-threatening at this time.  I will give you some Bentyl to help with the abdominal cramping and diarrhea.  If the symptoms persists or get worse please follow-up with your primary care for further evaluation.

## 2018-06-06 NOTE — ED Provider Notes (Signed)
[facility]    CSN: [id number] Arrival date & time: 06/06/18  4098     History   Chief Complaint Chief Complaint  Patient presents with  . Abdominal Pain    IBS  . Blood In Stools    HPI Stephanie [name] is a 22 y.o. female.   Patient is a 22 year old female with history of IBS that presents with abdominal pain, diarrhea and blood in stool.  This started this a.m. when she was having a bowel movement.  She reports she had an episode of sharp upper abdominal pain with cramping that made her cry and then proceeded to have an episode of diarrhea.  When she looked into the toilet she saw bright red blood but no clots. She feels better but is still having some abd cramping. Denies any nausea, vomiting.   She reports a history of IBS-C but has not struggled with any constipation recently.  She denies any rectal pain or presence of hemorrhoids.    She did have sexual intercourse last night but has had the same partner. She denies worry for STDs. She denies any vaginal bleeding, pelvic pain or abnormal discharge.   ROS per HPI      Past Medical History:  Diagnosis Date  . Anxiety 01/28/2013  . Depression   . IBS (irritable bowel syndrome)   . Migraines    daily for the past two weeks, 1-2 times weekly prior to that.  . Recurrent streptococcal tonsillitis   . Seasonal allergies    Seasonal environmental allergies  . Vision abnormalities    Myopia; patient has history of devloping ulcers when she wore contacts.    Patient Active Problem List   Diagnosis Date Noted  . Preventative health care 11/20/2017  . Menorrhagia with regular cycle 09/24/2014  . Migraines 08/04/2014  . Abnormal vision screen 04/03/2013  . Loss of weight 04/03/2013  . Allergic rhinitis 04/03/2013  . MDD (major depressive disorder), single episode, severe (HCC) 01/28/2013    Class: Chronic  . Generalized anxiety disorder 01/28/2013    History reviewed. No pertinent surgical  history.  OB History   None      Home Medications    Prior to Admission medications   Medication Sig Start Date End Date Taking? Authorizing Provider  ibuprofen (ADVIL,MOTRIN) 200 MG tablet Take 200 mg by mouth every 6 (six) hours as needed.    [provider]  metroNIDAZOLE (FLAGYL) 500 MG tablet Take 1 tablet (500 mg total) by mouth 2 (two) times daily. 02/12/18   Doreene Nest, NP  sertraline (ZOLOFT) 25 MG tablet Take 1 tablet (25 mg total) by mouth daily. 01/06/18   Doreene Nest, NP    Family History Family History  Problem Relation Age of Onset  . Depression Mother   . Migraines Mother   . Migraines Father     Social History Social History   Tobacco Use  . Smoking status: Never Smoker  . Smokeless tobacco: Never Used  Substance Use Topics  . Alcohol use: No  . Drug use: No     Allergies   Patient has no known allergies.   Review of Systems Review of Systems   Physical Exam Triage Vital Signs ED Triage Vitals  Enc Vitals Group     BP 06/06/18 0916 121/79     Pulse Rate 06/06/18 0916 100     Resp 06/06/18 0916 19     Temp 06/06/18 0916 98.6 F (37 C)  Temp Source 06/06/18 0916 Oral     SpO2 06/06/18 0916 97 %     Weight --      Height --      Head Circumference --      Peak Flow --      Pain Score 06/06/18 0915 5     Pain Loc --      Pain Edu? --      Excl. in GC? --    No data found.  Updated Vital Signs BP 121/79 (BP Location: Left Arm)   Pulse 100   Temp 98.6 F (37 C) (Oral)   Resp 19   LMP 05/21/2018   SpO2 97%   Visual Acuity Right Eye Distance:   Left Eye Distance:   Bilateral Distance:    Right Eye Near:   Left Eye Near:    Bilateral Near:     Physical Exam  Constitutional: She is oriented to person, place, and time. She appears well-developed and well-nourished.  HENT:  Head: Normocephalic and atraumatic.  Cardiovascular: Normal rate and regular rhythm.  Pulmonary/Chest: Effort normal and  breath sounds normal.  Abdominal: Normal appearance and bowel sounds are normal. She exhibits no shifting dullness, no distension, no pulsatile liver, no fluid wave, no abdominal bruit, no ascites, no pulsatile midline mass and no mass. There is no splenomegaly or hepatomegaly. There is generalized tenderness. There is no rigidity, no rebound, no guarding, no CVA tenderness, no tenderness at McBurney's point and negative Murphy's sign.  Genitourinary: Rectum normal. Rectal exam shows no mass, no tenderness and guaiac negative stool.  Genitourinary Comments: No internal or external hemorrhoids.  No obvious blood with rectal examination.  No fissures, ulcers, lesions.  Neurological: She is alert and oriented to person, place, and time.  Skin: Skin is warm and dry. Capillary refill takes less than 2 seconds.  Psychiatric: She has a normal mood and affect. Her behavior is normal.     UC Treatments / Results  Labs (all labs ordered are listed, but only abnormal results are displayed) Labs Reviewed  POCT URINALYSIS DIP (DEVICE) - Abnormal; Notable for the following components:      Result Value   Hgb urine dipstick TRACE (*)    All other components within normal limits  POCT I-STAT, CHEM 8 - Abnormal; Notable for the following components:   Glucose, Bld 100 (*)    All other components within normal limits  OCCULT BLOOD X 1 CARD TO LAB, STOOL  POCT PREGNANCY, URINE  OCCULT BLOOD, POC DEVICE    EKG None  Radiology No results found.  Procedures Procedures (including critical care time)  Medications Ordered in UC Medications - No data to display  Initial Impression / Assessment and Plan / UC Course  I have reviewed the triage vital signs and the nursing notes.  Pertinent labs & imaging results that were available during my care of the patient were reviewed by me and considered in my medical decision making (see chart for details).    Rectal Exam performed.  Negative for occult  blood. No external hemorrhoids or internal hemorrhoids. No fissure.  Urine showed trace blood but negative for leuks  We will get i-STAT and orthostatic based on blood loss and patient's history of anemia.    I-STAT Chem-8 normal. Orthostatics normal.  No anemia or acute blood loss that is life threatening.   This could be bleeding from the vaginal area due to trace blood in the urine and sexual intercourse last night.  No blood in the stool, hemorrhoids or lesions.  The pain in the stomach is better but she still has some cramping. I will give her some bentyl for the abd cramping to see if this helps. She is aware that it could have the opposite effect and cause constipation.    Nothing life threatening and no acute abdomen on exam.  She can follow up with her primary care if the symptoms persist or worsen. Pt agreeable to plan.     Final Clinical Impressions(s) / UC Diagnoses   Final diagnoses:  None   Discharge Instructions   None    ED Prescriptions    None     Controlled Substance Prescriptions [place] Controlled Substance Registry consulted? Not Applicable   Janace Aris, NP 06/06/18 1053

## 2018-06-06 NOTE — Telephone Encounter (Signed)
Noted, need to check on patient Monday July 22nd.

## 2018-06-06 NOTE — Telephone Encounter (Signed)
Pt called with having rectal bleeding this morning. She had abd pain and went to the bathroom and the commode was full of blood with stool. She states maybe a little light headed but thinks it is because she has not eaten breakfast this morning. She does have a hx of IBS and see a GI provider. She is not on a blood thinner. [facility]. Pt advise to go to Urgent Care to be assessed. Pt voiced understanding. Advised to call back if increase bleeding or other symptoms. Pt voiced understanding  Reason for Disposition . MODERATE rectal bleeding (small blood clots, passing blood without stool, or toilet water turns red)  Answer Assessment - Initial Assessment Questions 1. APPEARANCE of BLOOD: "What color is it?" "Is it passed separately, on the surface of the stool, or mixed in with the stool?"      Bright red blood, mixed in with stool 2. AMOUNT: "How much blood was passed?"      Commode water was covered 3. FREQUENCY: "How many times has blood been passed with the stools?"      Just once 4. ONSET: "When was the blood first seen in the stools?" (Days or weeks)      This morning 5. DIARRHEA: "Is there also some diarrhea?" If so, ask: "How many diarrhea stools were passed in past 24 hours?"      yes 6. CONSTIPATION: "Do you have constipation?" If so, "How bad is it?"     Yes have not been since in about a week or 2 7. RECURRENT SYMPTOMS: "Have you had blood in your stools before?" If so, ask: "When was the last time?" and "What happened that time?"      Not like this 8. BLOOD THINNERS: "Do you take any blood thinners?" (e.g., Coumadin/warfarin, Pradaxa/dabigatran, aspirin)     no 9. OTHER SYMPTOMS: "Do you have any other symptoms?"  (e.g., abdominal pain, vomiting, dizziness, fever)     abd pain, nausea  10. PREGNANCY: "Is there any chance you are pregnant?" "When was your last menstrual period?"       No LMP July 3rd  Protocols used: RECTAL BLEEDING-A-AH

## 2018-06-06 NOTE — ED Triage Notes (Signed)
Pt presents with a lot of blood in stool and abdominal pain. Pt states she has IBS.

## 2018-06-10 NOTE — Telephone Encounter (Signed)
Spoken to patient stated that she did get my message yesterday.  Patient stated she feels tired over all but better. So far no more blood in a stool. She also wanted to mention to Jae DireKate that on Saturday her armpits were swelling and she had a fever. She is better now and no swelling, no fever. She is not sure about it but wanted Jae DireKate to know.

## 2018-06-10 NOTE — Telephone Encounter (Signed)
Noted  

## 2018-07-08 ENCOUNTER — Ambulatory Visit (HOSPITAL_COMMUNITY)
Admission: EM | Admit: 2018-07-08 | Discharge: 2018-07-08 | Disposition: A | Discharge status: Home / Self Care | Payer: No Typology Code available for payment source | Attending: Physician Assistant | Admitting: Physician Assistant

## 2018-07-08 ENCOUNTER — Encounter (HOSPITAL_COMMUNITY): Payer: Self-pay | Admitting: Emergency Medicine

## 2018-07-08 ENCOUNTER — Ambulatory Visit: Payer: Self-pay | Admitting: *Deleted

## 2018-07-08 DIAGNOSIS — N920 Excessive and frequent menstruation with regular cycle: Secondary | ICD-10-CM | POA: Diagnosis not present

## 2018-07-08 DIAGNOSIS — R42 Dizziness and giddiness: Secondary | ICD-10-CM | POA: Diagnosis not present

## 2018-07-08 LAB — POCT I-STAT, CHEM 8
BUN: 6 mg/dL (ref 6–20)
CALCIUM ION: 1.21 mmol/L (ref 1.15–1.40)
CHLORIDE: 105 mmol/L (ref 98–111)
Creatinine, Ser: 0.9 mg/dL (ref 0.44–1.00)
GLUCOSE: 76 mg/dL (ref 70–99)
HCT: 42 % (ref 36.0–46.0)
Hemoglobin: 14.3 g/dL (ref 12.0–15.0)
Potassium: 3.6 mmol/L (ref 3.5–5.1)
Sodium: 140 mmol/L (ref 135–145)
TCO2: 21 mmol/L — ABNORMAL LOW (ref 22–32)

## 2018-07-08 MED ORDER — ONDANSETRON HCL 4 MG/2ML IJ SOLN
4.0000 mg | Freq: Once | INTRAMUSCULAR | Status: AC
  Administered 2018-07-08: 4 mg via INTRAVENOUS

## 2018-07-08 MED ORDER — ONDANSETRON 4 MG PO TBDP: 4.0000 mg | ORAL_TABLET | Freq: Three times a day (TID) | ORAL | 0 refills | Status: DC | PRN

## 2018-07-08 MED ORDER — ONDANSETRON HCL 4 MG/2ML IJ SOLN
INTRAMUSCULAR | Status: AC
Start: 2018-07-08 — End: ?
  Filled 2018-07-08: qty 2

## 2018-07-08 MED ORDER — SODIUM CHLORIDE 0.9 % IV BOLUS
1000.0000 mL | Freq: Once | INTRAVENOUS | Status: AC
  Administered 2018-07-08: 1000 mL via INTRAVENOUS

## 2018-07-08 NOTE — Discharge Instructions (Signed)
Your blood work was normal today. Heart rate is now normal after fluids. Continue zofran as needed for nausea/vomiting. Ibuprofen 800mg  three times a day for abdominal cramping. Follow up with GYN for further evaluation of heavy menstruation. If experiencing worsening symptoms, worsening abdominal pain, nausea/vomiting despite medication, dizziness, weakness, chest pain, shortness of breath, passing out, go to the emergency department for further evaluation needed.

## 2018-07-08 NOTE — Telephone Encounter (Addendum)
Noted and agree with recommendations. Patient now at urgent care.

## 2018-07-08 NOTE — Telephone Encounter (Signed)
Pt reports "Very heavy"  menses, soaking through a superpad every hour at least.Third day of heavy cycle. Reports "Many large clots."  States has been lightheaded, nauseated and diaphoretic alternating with chills. States 1 episode of emesis "Yellowish, I haven't eaten anything, not drinking much."  Reports back pain, abdominal pain and bilateral leg pain, 5/10 after taking Midol and tylenol; "really bad" before meds. States she uses Financial risk analystXulane patch for birth control. Pt directed to ED; states she will follow disposition, needs to first find transportation. Care advise given per protocol. Reason for Disposition . SEVERE vaginal bleeding (i.e., soaking 2 pads or tampons per hour and present 2 or more hours; 1 menstrual cup every 2 hours)    Large amount of clots, back and abdominal pain; lightheaded  Answer Assessment - Initial Assessment Questions 1. AMOUNT: "Describe the bleeding that you are having."    - SPOTTING: spotting, or pinkish / brownish mucous discharge; does not fill panti-liner or pad    - MILD:  less than 1 pad / hour; less than patient's usual menstrual bleeding   - MODERATE: 1-2 pads / hour; 1 menstrual cup every 6 hours; small-medium blood clots (e.g., pea, grape, small coin)   - SEVERE: soaking 2 or more pads/hour for 2 or more hours; 1 menstrual cup every 2 hours; bleeding not contained by pads or continuous red blood from vagina; large blood clots (e.g., golf ball, large coin)      Moderate to severe with many large clots 2. ONSET: "When did the bleeding begin?" "Is it continuing now?"    3 days 3. MENSTRUAL PERIOD: "When was the last normal menstrual period?" "How is this different than your period?"     Last month 4. REGULARITY: "How regular are your periods?"     Heavy at times; this menses was 1-2 weeks late 5. ABDOMINAL PAIN: "Do you have any pain?" "How bad is the pain?"  (e.g., Scale 1-10; mild, moderate, or severe)   - MILD (1-3): doesn't interfere with normal activities,  abdomen soft and not tender to touch    - MODERATE (4-7): interferes with normal activities or awakens from sleep, tender to touch    - SEVERE (8-10): excruciating pain, doubled over, unable to do any normal activities      Back, cramping, 5/10 after taking Midol and tylenol  6. PREGNANCY: "Could you be pregnant?" "Are you sexually active?" "Did you recently give birth?"    no 7. BREASTFEEDING: "Are you breastfeeding?"     n/a 8. HORMONES: "Are you taking any hormone medications, prescription or OTC?" (e.g., birth control pills, estrogen)     Arm patch 9. BLOOD THINNERS: "Do you take any blood thinners?" (e.g., Coumadin/warfarin, Pradaxa/dabigatran, aspirin)     no 10. CAUSE: "What do you think is causing the bleeding?" (e.g., recent gyn surgery, recent gyn procedure; known bleeding disorder, cervical cancer, polycystic ovarian disease, fibroids)         unsure 11. HEMODYNAMIC STATUS: "Are you weak or feeling lightheaded?" If so, ask: "Can you stand and walk normally?"        Lightheaded, worse upon standing 12. OTHER SYMPTOMS: "What other symptoms are you having with the bleeding?" (e.g., passed tissue, vaginal discharge, fever, menstrual-type cramps)       Chills, sweating, nausea,vomiting x 1 "Just yellowish", not drinking much  Protocols used: VAGINAL BLEEDING - ABNORMAL-A-AH

## 2018-07-08 NOTE — ED Provider Notes (Signed)
[facility]    CSN: [id number] Arrival date & time: 07/08/18  1256     History   Chief Complaint Chief Complaint  Patient presents with  . Vaginal Bleeding    HPI Stephanie [name] is a 22 y.o. female.   22 year old female comes in for 2 day history of menorrhagia. States normal cycles are usually 6-7 days, and needing to change super tampons every 3-4 hours. These past 2 days, has been having to change a super + tampon every hour and needing pads to prevent bleed through. She has abdominal cramping with back pain. Nausea with 1 episode of vomiting. Dizziness, worse with position transitions, which can cause some blurry vision. Denies chest pain. Had short of breath while feeling dizzy. States took immodium to help with "upset stomach". Did has some loose stools. States was here last month with negative pregnancy test, has not been sexually active since. Cramping and back pain improves after tylenol/motrin. Patient with nexplanon implant 10/2017, no spotting between cycles.     Past Medical History:  Diagnosis Date  . Anxiety 01/28/2013  . Depression   . IBS (irritable bowel syndrome)   . Migraines    daily for the past two weeks, 1-2 times weekly prior to that.  . Recurrent streptococcal tonsillitis   . Seasonal allergies    Seasonal environmental allergies  . Vision abnormalities    Myopia; patient has history of devloping ulcers when she wore contacts.    Patient Active Problem List   Diagnosis Date Noted  . Preventative health care 11/20/2017  . Menorrhagia with regular cycle 09/24/2014  . Migraines 08/04/2014  . Abnormal vision screen 04/03/2013  . Loss of weight 04/03/2013  . Allergic rhinitis 04/03/2013  . MDD (major depressive disorder), single episode, severe (HCC) 01/28/2013    Class: Chronic  . Generalized anxiety disorder 01/28/2013    History reviewed. No pertinent surgical history.  OB History   None      Home Medications    Prior  to Admission medications   Medication Sig Start Date End Date Taking? Authorizing Provider  dicyclomine (BENTYL) 20 MG tablet Take 1 tablet (20 mg total) by mouth 2 (two) times daily. 06/06/18   Dahlia ByesBast, Traci A, NP  ibuprofen (ADVIL,MOTRIN) 200 MG tablet Take 200 mg by mouth every 6 (six) hours as needed.    [provider]  metroNIDAZOLE (FLAGYL) 500 MG tablet Take 1 tablet (500 mg total) by mouth 2 (two) times daily. Patient not taking: Reported on 07/08/2018 02/12/18   Doreene Nestlark, Katherine K, NP  ondansetron (ZOFRAN ODT) 4 MG disintegrating tablet Take 1 tablet (4 mg total) by mouth every 8 (eight) hours as needed for nausea or vomiting. 07/08/18   Cathie HoopsYu, Amy V, PA-C  sertraline (ZOLOFT) 25 MG tablet Take 1 tablet (25 mg total) by mouth daily. 01/06/18   Doreene Nestlark, Katherine K, NP    Family History Family History  Problem Relation Age of Onset  . Depression Mother   . Migraines Mother   . Migraines Father     Social History Social History   Tobacco Use  . Smoking status: Never Smoker  . Smokeless tobacco: Never Used  Substance Use Topics  . Alcohol use: No  . Drug use: No     Allergies   Patient has no known allergies.   Review of Systems Review of Systems  Reason unable to perform ROS: See HPI as above.     Physical Exam Triage Vital Signs ED  Triage Vitals  Enc Vitals Group     BP 07/08/18 1310 (!) 149/98     Pulse Rate 07/08/18 1310 (!) 132     Resp 07/08/18 1310 16     Temp 07/08/18 1310 98.5 F (36.9 C)     Temp Source 07/08/18 1310 Oral     SpO2 07/08/18 1310 100 %     Weight --      Height --      Head Circumference --      Peak Flow --      Pain Score 07/08/18 1327 6     Pain Loc --      Pain Edu? --      Excl. in GC? --    No data found.  Updated Vital Signs BP 132/82 (BP Location: Right Arm)   Pulse 99   Temp 98.5 F (36.9 C) (Oral)   Resp 16   SpO2 100%    Physical Exam  Constitutional: She is oriented to person, place, and time. She appears  well-developed and well-nourished. No distress.  HENT:  Head: Normocephalic and atraumatic.  Eyes: Pupils are equal, round, and reactive to light. Conjunctivae are normal.  Neck: Normal range of motion. Neck supple.  Cardiovascular: Regular rhythm and normal heart sounds. Tachycardia present. Exam reveals no gallop and no friction rub.  No murmur heard. Pulmonary/Chest: Effort normal and breath sounds normal. No accessory muscle usage or stridor. No respiratory distress. She has no decreased breath sounds. She has no wheezes. She has no rhonchi. She has no rales.  Abdominal: Soft. Bowel sounds are normal. She exhibits no mass. There is no rigidity and no CVA tenderness.  Diffuse tenderness to palpation without guarding, rebound.   Musculoskeletal: She exhibits no edema.  Neurological: She is alert and oriented to person, place, and time. She is not disoriented.  Patient ambulated into office on own.   Skin: Skin is warm. She is diaphoretic. No pallor.  Psychiatric: She has a normal mood and affect. Her behavior is normal. Judgment normal.    UC Treatments / Results  Labs (all labs ordered are listed, but only abnormal results are displayed) Labs Reviewed  POCT I-STAT, CHEM 8 - Abnormal; Notable for the following components:      Result Value   TCO2 21 (*)    All other components within normal limits    EKG None  Radiology No results found.  Procedures Procedures (including critical care time)  Medications Ordered in UC Medications  sodium chloride 0.9 % bolus 1,000 mL (0 mLs Intravenous Stopped 07/08/18 1509)  ondansetron (ZOFRAN) injection 4 mg (4 mg Intravenous Given 07/08/18 1411)    Initial Impression / Assessment and Plan / UC Course  I have reviewed the triage vital signs and the nursing notes.  Pertinent labs & imaging results that were available during my care of the patient were reviewed by me and considered in my medical decision making (see chart for  details).    Patient with much improvement after IV zofran, IV fluid. Improvement on pulse rate though still tachycardic. No longer diaphoretic. Current denies chest pain, shortness of breath, palpations. istat without anemia, will reassess after 1L IV bolus.   Patient with significantly improved symptoms after IV fluids. She is no longer tachycardic, diaphoretic. Skin warm and dry. Abdomin without tenderness to palpation. Will discharge patient with zofran for nausea and have patient follow up with GYN for further evaluation and management of menorrhagia. Return precautions given. Patient  expresses understanding and agrees to plan.  Final Clinical Impressions(s) / UC Diagnoses   Final diagnoses:  Menorrhagia with regular cycle    ED Prescriptions    Medication Sig Dispense Auth. Provider   ondansetron (ZOFRAN ODT) 4 MG disintegrating tablet Take 1 tablet (4 mg total) by mouth every 8 (eight) hours as needed for nausea or vomiting. 15 tablet Threasa AlphaYu, Amy V, PA-C        Yu, Amy V, [place] 07/08/18 1515

## 2018-07-08 NOTE — ED Triage Notes (Signed)
Pt here for vaginal bleeding more than normal x 2 days

## 2018-07-25 ENCOUNTER — Ambulatory Visit: Payer: No Typology Code available for payment source | Admitting: Primary Care

## 2018-07-29 ENCOUNTER — Encounter: Payer: Self-pay | Admitting: Primary Care

## 2018-07-29 ENCOUNTER — Ambulatory Visit (INDEPENDENT_AMBULATORY_CARE_PROVIDER_SITE_OTHER): Payer: No Typology Code available for payment source | Admitting: Primary Care

## 2018-07-29 VITALS — BP 116/78 | HR 90 | Temp 98.2°F | Ht 63.25 in | Wt 126.0 lb

## 2018-07-29 DIAGNOSIS — F411 Generalized anxiety disorder: Secondary | ICD-10-CM | POA: Diagnosis not present

## 2018-07-29 DIAGNOSIS — F322 Major depressive disorder, single episode, severe without psychotic features: Secondary | ICD-10-CM | POA: Diagnosis not present

## 2018-07-29 DIAGNOSIS — Z111 Encounter for screening for respiratory tuberculosis: Secondary | ICD-10-CM | POA: Diagnosis not present

## 2018-07-29 NOTE — Addendum Note (Signed)
Addended by: Tawnya Crook on: 07/29/2018 12:31 PM   Modules accepted: Orders

## 2018-07-29 NOTE — Assessment & Plan Note (Addendum)
Stopped Zoloft on her own in March 2019. Intermittent breakthrough symptoms, overall manages well but do see that she would benefit with treatment. Referral placed to therapy for further evaluation.   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 Form completed.

## 2018-07-29 NOTE — Patient Instructions (Signed)
You will be contacted regarding your referral to therapy.  Please let us know if you have not been contacted within one week.   Please notify me if your symptoms progress as discussed.  Return Thursday this week for your TB skin test read.  It was a pleasure to see you today!

## 2018-07-29 NOTE — Assessment & Plan Note (Addendum)
Stopped Zoloft on her own in March 2019. Intermittent breakthrough symptoms, overall manages well but do see that she would benefit with treatment. Referral placed to therapy for further evaluation.   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

## 2018-07-29 NOTE — Progress Notes (Signed)
Subjective:    Patient ID: Stephanie [name], female    DOB: 05/27/1996, 22 y.o.   MRN: [id number]  HPI  Stephanie [name] is a 22 year old female who presents today for form completion.  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   She was once managed on Zoloft 25 mg for which she stopped in March 2019 due to visual side effects and also lowered sex drive. She did feel better on the medication overall, but was turned off due to side effects. She does have a problem with mood swings, feels increased emotional for a few days then fine for a few days. Symptoms include tearfulness, feeling down, feeling anxious, easily annoyed/irritable. She denies SI/HI but has had dark thoughts.   Review of Systems  Eyes: Negative for visual disturbance.  Respiratory: Negative for shortness of breath.   Cardiovascular: Negative for chest pain and palpitations.  Musculoskeletal: Negative for arthralgias and myalgias.  Neurological: Negative for dizziness and headaches.  Psychiatric/Behavioral:       See HPI       Past Medical History:  Diagnosis Date  . Anxiety 01/28/2013  . Depression   . IBS (irritable bowel syndrome)   . Migraines    daily for the past two weeks, 1-2 times weekly prior to that.  . Recurrent streptococcal tonsillitis   . Seasonal allergies    Seasonal environmental allergies  . Vision abnormalities    Myopia; patient has history of devloping ulcers when she wore contacts.     Social History   Socioeconomic History  . Marital status: Single    Spouse name: Not on file  . Number of children: Not on file  . Years of education: Not on file  . Highest education level: Not on file  Occupational History  . Not on file  Social Needs  . Financial resource strain: Not on file  . Food insecurity:    Worry: Not on file    Inability: Not on file  . Transportation needs:    Medical: Not on file    Non-medical: Not on file    Tobacco Use  . Smoking status: Never Smoker  . Smokeless tobacco: Never Used  Substance and Sexual Activity  . Alcohol use: No  . Drug use: No  . Sexual activity: Yes    Partners: Male    Birth control/protection: Implant    Comment: broke up with first sexual partner 1 month ago  Lifestyle  . Physical activity:    Days per week: Not on file    Minutes per session: Not on file  . Stress: Not on file  Relationships  . Social connections:    Talks on phone: Not on file    Gets together: Not on file    Attends religious service: Not on file    Active member of club or organization: Not on file    Attends meetings of clubs or organizations: Not on file    Relationship status: Not on file  . Intimate partner violence:    Fear of current or ex partner: Not on file    Emotionally abused: Not on file    Physically abused: Not on file    Forced sexual activity: Not on file  Other Topics Concern  . Not on file  Social History Narrative   Single.    Student at [employer].   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  Enjoys listening to music, hanging out with friends.     No past surgical history on file.  Family History  Problem Relation Age of Onset  . Depression Mother   . Migraines Mother   . Migraines Father     No Known Allergies  Current Outpatient Medications on File Prior to Visit  Medication Sig Dispense Refill  . ibuprofen (ADVIL,MOTRIN) 200 MG tablet Take 200 mg by mouth every 6 (six) hours as needed.    . ondansetron (ZOFRAN ODT) 4 MG disintegrating tablet Take 1 tablet (4 mg total) by mouth every 8 (eight) hours as needed for nausea or vomiting. 15 tablet 0   No current facility-administered medications on file prior to visit.     BP 116/78   Pulse 90   Temp 98.2 F (36.8 C) (Oral)   Ht 5' 3.25" (1.607 m)   Wt 126 lb (57.2 kg)   LMP 07/06/2018   SpO2 99%   BMI 22.14 kg/m    Objective:   Physical Exam  Constitutional: She appears  well-nourished.  Neck: Neck supple.  Cardiovascular: Normal rate and regular rhythm.  Respiratory: Effort normal and breath sounds normal.  Skin: Skin is warm and dry.  Psychiatric: She has a normal mood and affect.           Assessment & Plan:

## 2018-08-04 ENCOUNTER — Encounter: Payer: Self-pay | Admitting: Primary Care

## 2018-08-04 ENCOUNTER — Encounter: Payer: Self-pay | Admitting: Family Medicine

## 2018-08-04 ENCOUNTER — Telehealth: Payer: Self-pay

## 2018-08-04 ENCOUNTER — Ambulatory Visit (INDEPENDENT_AMBULATORY_CARE_PROVIDER_SITE_OTHER): Payer: No Typology Code available for payment source | Admitting: Family Medicine

## 2018-08-04 VITALS — BP 112/70 | HR 94 | Temp 98.5°F | Ht 63.25 in | Wt 124.5 lb

## 2018-08-04 DIAGNOSIS — L0291 Cutaneous abscess, unspecified: Secondary | ICD-10-CM | POA: Diagnosis not present

## 2018-08-04 DIAGNOSIS — L723 Sebaceous cyst: Secondary | ICD-10-CM

## 2018-08-04 HISTORY — DX: Sebaceous cyst: L72.3

## 2018-08-04 MED ORDER — CEPHALEXIN 500 MG PO CAPS: 500.0000 mg | ORAL_CAPSULE | Freq: Three times a day (TID) | ORAL | 0 refills | Status: DC

## 2018-08-04 NOTE — Patient Instructions (Signed)
For cyst under arm- keep clean / don't shave for now  Warm compress - several times per day Take the keflex as directed   If worse or not improving let me know  You had an early sebaceous cyst on your back (large blackhead)  I expressed material out of it  Keep clean with soap and water   Call if it comes back

## 2018-08-04 NOTE — Assessment & Plan Note (Signed)
Early seb cyst (comedone) on L mid back  Cleaned and aneth with spray anesthetic  Expressed black comedonal material  Covered with band aid Disc wound care  Disc what to watch for - update  May need derm to remove entire thing in the future

## 2018-08-04 NOTE — Telephone Encounter (Addendum)
I spoke with pt and the area under rt arm pit is not draining today but is sore and feels warm to the touch. Pt to see Dr Milinda Antisower 08/04/18 at 9 AM. Pt has already been seen 08/04/18.

## 2018-08-04 NOTE — Assessment & Plan Note (Signed)
Pt has intermittent axillary skin cysts  tx with keflex  Warm compress to enc more drainage  Keep clean/soap and water  Do not shave until healed Update if not starting to improve in a week or if worsening

## 2018-08-04 NOTE — Progress Notes (Signed)
Subjective:    Patient ID: Stephanie [name], female    DOB: 02/05/1996, 22 y.o.   MRN: [id number]  HPI 22 yo pt of [facility] with skin lesion - R axilla and back     Wt Readings from Last 3 Encounters:  08/04/18 124 lb 8 oz (56.5 kg)  07/29/18 126 lb (57.2 kg)  02/11/18 125 lb 12 oz (57 kg)   21.88 kg/m   4-5 days ago - lump under R arm -was painful She has had these before  It popped after she lay down  She tried to express more pus/material out  Had a low grade temp several days ago   Small lump on back -bothers her mother/ she does not feel it   Patient Active Problem List   Diagnosis Date Noted  . Abscess 08/04/2018  . Sebaceous cyst 08/04/2018  . Preventative health care 11/20/2017  . Menorrhagia with regular cycle 09/24/2014  . Migraines 08/04/2014  . Abnormal vision screen 04/03/2013  . Loss of weight 04/03/2013  . Allergic rhinitis 04/03/2013  . MDD (major depressive disorder), single episode, severe (HCC) 01/28/2013    Class: Chronic  . Generalized anxiety disorder 01/28/2013   Past Medical History:  Diagnosis Date  . Anxiety 01/28/2013  . Depression   . IBS (irritable bowel syndrome)   . Migraines    daily for the past two weeks, 1-2 times weekly prior to that.  . Recurrent streptococcal tonsillitis   . Seasonal allergies    Seasonal environmental allergies  . Vision abnormalities    Myopia; patient has history of devloping ulcers when she wore contacts.   History reviewed. No pertinent surgical history. Social History   Tobacco Use  . Smoking status: Never Smoker  . Smokeless tobacco: Never Used  Substance Use Topics  . Alcohol use: No  . Drug use: No   Family History  Problem Relation Age of Onset  . Depression Mother   . Migraines Mother   . Migraines Father    No Known Allergies Current Outpatient Medications on File Prior to Visit  Medication Sig Dispense Refill  . ibuprofen (ADVIL,MOTRIN) 200 MG tablet Take 200 mg by mouth  every 6 (six) hours as needed.    . ondansetron (ZOFRAN ODT) 4 MG disintegrating tablet Take 1 tablet (4 mg total) by mouth every 8 (eight) hours as needed for nausea or vomiting. 15 tablet 0   No current facility-administered medications on file prior to visit.     Review of Systems  Constitutional: Negative for activity change, appetite change, fatigue, fever and unexpected weight change.  HENT: Negative for congestion, ear pain, rhinorrhea, sinus pressure and sore throat.   Eyes: Negative for pain, redness and visual disturbance.  Respiratory: Negative for cough, shortness of breath and wheezing.   Cardiovascular: Negative for chest pain and palpitations.  Gastrointestinal: Negative for abdominal pain, blood in stool, constipation and diarrhea.  Endocrine: Negative for polydipsia and polyuria.  Genitourinary: Negative for dysuria, frequency and urgency.  Musculoskeletal: Negative for arthralgias, back pain and myalgias.  Skin: Negative for pallor and rash.       Skin cyst/abscess under R arm  ? Black head on back   Allergic/Immunologic: Negative for environmental allergies.  Neurological: Negative for dizziness, syncope and headaches.  Hematological: Negative for adenopathy. Does not bruise/bleed easily.  Psychiatric/Behavioral: Negative for decreased concentration and dysphoric mood. The patient is not nervous/anxious.        Objective:   Physical Exam  Constitutional:  She appears well-developed and well-nourished. No distress.  HENT:  Head: Normocephalic and atraumatic.  Eyes: Pupils are equal, round, and reactive to light. Conjunctivae and EOM are normal. No scleral icterus.  Neck: Normal range of motion. Neck supple.  Cardiovascular: Normal rate, regular rhythm and normal heart sounds.  Pulmonary/Chest: Effort normal and breath sounds normal.  Lymphadenopathy:    She has no cervical adenopathy.  Neurological: She is alert. Coordination normal.  Skin: Skin is warm and dry.  No rash noted. No pallor.  1.5 cm abscess R axilla= recently drained / scant erythema and tenderness   1 cm comedone on R mid back  Expressed material with relief today           Assessment & Plan:   Problem List Items Addressed This Visit      Musculoskeletal and Integument   Sebaceous cyst    Early seb cyst (comedone) on L mid back  Cleaned and aneth with spray anesthetic  Expressed black comedonal material  Covered with band aid Disc wound care  Disc what to watch for - update  May need derm to remove entire thing in the future        Other   Abscess - Primary    Pt has intermittent axillary skin cysts  tx with keflex  Warm compress to enc more drainage  Keep clean/soap and water  Do not shave until healed Update if not starting to improve in a week or if worsening

## 2018-08-04 NOTE — Telephone Encounter (Signed)
PLEASE NOTE: All timestamps contained within this report are represented as [place] Time. CONFIDENTIALTY NOTICE: This fax transmission is intended only for the addressee. It contains information that is legally privileged, confidential or otherwise protected from use or disclosure. If you are not the intended recipient, you are strictly prohibited from reviewing, disclosing, copying using or disseminating any of this information or taking any action in reliance on or regarding this information. If you have received this fax in error, please notify us immediately by telephone so that we can arrange for its return to us. Phone: [phone], Toll-Free: [phone], Fax: [phone] Page: 1 of 2 Call Id: [id number] [facility] Client TELEPHONE ADVICE RECORD [facility] Patient Name: Stephanie [name] Gender: Female DOB: 08/21/1996 Age: [age] Y 8 M 10 D Return Phone Number: [phone] (Primary) Address: [place]: [place] [id number] Client [facility] Night Physician Vernona Riegerlark, Katherine - NP Contact Type Call Who Is Calling Patient / Member / Family / Caregiver Call Type Triage / Clinical Relationship To Patient Self Return Phone Number [phone] (Primary) Chief Complaint Skin Lesion - Moles/ Lumps/ Growths Reason for Call Symptomatic / Request for Health Information Initial Comment Caller states she has a lump under her arm that has grown in size and is draining pus. It is hot to the touch and hurts. Translation No Nurse Assessment Nurse: Stephanie FieldWright, RN, Ivin BootyJoshua Date/Time (Eastern Time): 08/03/2018 1:18:53 PM Confirm and document reason for call. If symptomatic, describe symptoms. ---Caller states she has a lump under her right arm that has grown in size and is draining pus. It is hot to the touch and hurts. It has been there for 4-5  days. It is about half dollar sized. Does the patient have any new or worsening symptoms? ---Yes Will a triage be completed? ---Yes Related visit to physician within the last 2 weeks? ---No Does the PT have any chronic conditions? (i.e. diabetes, asthma, this includes High risk factors for pregnancy, etc.) ---No Is the patient pregnant or possibly pregnant? (Ask all females between the ages of 212-55) ---No Is this a behavioral health or substance abuse call? ---No Guidelines Guideline Title Affirmed Question Affirmed Notes Nurse Date/Time (Eastern Time) Boil (Skin Abscess) [1] Boil > 1/2 inch across (> 12 mm; larger than a marble) AND [2] center is soft or pus colored Stephanie FieldWright, RN, Ivin BootyJoshua 08/03/2018 1:20:53 PM Disp. Time Lamount Cohen(Eastern Time) Disposition Final User 08/03/2018 1:25:14 PM See PCP within 24 Hours Yes Stephanie FieldWright, RN, Madelynn DoneJoshua Caller Disagree/Comply Comply PLEASE NOTE: All timestamps contained within this report are represented as [place] Standard Time. CONFIDENTIALTY NOTICE: This fax transmission is intended only for the addressee. It contains information that is legally privileged, confidential or otherwise protected from use or disclosure. If you are not the intended recipient, you are strictly prohibited from reviewing, disclosing, copying using or disseminating any of this information or taking any action in reliance on or regarding this information. If you have received this fax in error, please notify us immediately by telephone so that we can arrange for its return to us. Phone: [phone], Toll-Free: [phone], Fax: [phone] Page: 2 of 2 Call Id: [id number] Caller Understands Yes PreDisposition Did not know what to do Care Advice Given Per Guideline SEE PCP WITHIN 24 HOURS: * IF OFFICE WILL BE OPEN: You need to be seen within the next 24 hours. Call your doctor (or NP/PA) when the office  opens and make an appointment. TREATMENT FOR A BOIL - GENERAL: * Do not squeeze a  boil (Reason: This can push bacteria deeper into the skin). * Avoid touching or scratching the boil. * Keep your hands clean. Wash them with soap and water. TREATMENT FOR A BOIL - APPLY MOIST HEAT: * Heat can help bring the boil 'to a head' so that it can open and the pus can drain out. * Apply a warm, wet washcloth to the boil for 15 minutes 3 times a day. TREATMENT FOR A BOIL - APPLY ANTIBIOTIC OINTMENT: Apply an over-the-counter antibiotic ointment (e.g., Bacitracin) to the area of redness three times daily. DRAINING PUS - IT IS CONTAGIOUS: Pus or other drainage from an open boil is very contagious (you can spread it to others). DRAINING PUS - PREVENTING SPREAD TO YOURSELF AND OTHERS: * Wash the boil with soap and water each day. * Keep the boil covered with a clean dry dressing (e.g., gauze pad and tape). Throw the dirty dressings into the regular trash. PAIN MEDICINES: CALL BACK IF: * Severe pain or fever occurs * Widespread rash occurs * You become worse. CARE ADVICE per Boil and Abscess (Adult) guideline. Referrals GO TO FACILITY UNDECIDED

## 2018-08-14 ENCOUNTER — Ambulatory Visit (INDEPENDENT_AMBULATORY_CARE_PROVIDER_SITE_OTHER): Payer: No Typology Code available for payment source | Admitting: Psychology

## 2018-08-14 DIAGNOSIS — F410 Panic disorder [episodic paroxysmal anxiety] without agoraphobia: Secondary | ICD-10-CM | POA: Diagnosis not present

## 2018-08-20 ENCOUNTER — Ambulatory Visit: Payer: Self-pay | Admitting: Psychology

## 2018-09-26 ENCOUNTER — Other Ambulatory Visit: Payer: Self-pay

## 2018-09-26 ENCOUNTER — Encounter (HOSPITAL_COMMUNITY): Payer: Self-pay | Admitting: *Deleted

## 2018-10-15 NOTE — Anesthesia Preprocedure Evaluation (Addendum)
Anesthesia Evaluation  Patient identified by MRN, date of birth, ID band Patient awake    Reviewed: Allergy & Precautions, NPO status , Patient's Chart, lab work & pertinent test results  Airway Mallampati: II  TM Distance: >3 FB Neck ROM: Full    Dental no notable dental hx. (+) Teeth Intact, Dental Advisory Given   Pulmonary neg pulmonary ROS,    Pulmonary exam normal breath sounds clear to auscultation       Cardiovascular negative cardio ROS Normal cardiovascular exam Rhythm:Regular Rate:Normal     Neuro/Psych  Headaches, Anxiety Depression    GI/Hepatic negative GI ROS, Neg liver ROS,   Endo/Other    Renal/GU      Musculoskeletal   Abdominal   Peds  Hematology   Anesthesia Other Findings   Reproductive/Obstetrics negative OB ROS                            Lab Results  Component Value Date   WBC 3.9 (L) 02/11/2018   HGB 14.3 07/08/2018   HCT 42.0 07/08/2018   MCV 82.6 02/11/2018   PLT 295.0 02/11/2018    Anesthesia Physical Anesthesia Plan  ASA: I  Anesthesia Plan: General   Post-op Pain Management:    Induction: Intravenous  PONV Risk Score and Plan: 3 and Treatment may vary due to age or medical condition, Dexamethasone, Ondansetron, Scopolamine patch - Pre-op and Midazolam  Airway Management Planned: Oral ETT  Additional Equipment:   Intra-op Plan:   Post-operative Plan: Extubation in OR  Informed Consent: I have reviewed the patients History and Physical, chart, labs and discussed the procedure including the risks, benefits and alternatives for the proposed anesthesia with the patient or authorized representative who has indicated his/her understanding and acceptance.   Dental advisory given  Plan Discussed with: CRNA  Anesthesia Plan Comments:        Anesthesia Quick Evaluation

## 2018-10-19 ENCOUNTER — Encounter (HOSPITAL_COMMUNITY): Payer: Self-pay | Admitting: Anesthesiology

## 2018-10-19 NOTE — H&P (Signed)
Stephanie [name] is an 22 y.o. female with increasingly painful periods affecting her work and sleep.  She has Nexplanon for contraception which initially helped her dysmenorrhea but over time, the benefit has waned.  NSAIDs have also lost effect.  The patient has known IBS (s/p colonoscopy) and this pain does not feel the same. She has no urinary complaints.  Pelvic ultrasound shows normal ovaries and uterus.  She has no prior abdominal surgeries.   Pertinent Gynecological History: Menses: flow is moderate Bleeding: regular Contraception: Nexplanon DES exposure: unknown Blood transfusions: none Sexually transmitted diseases: no past history Previous GYN Procedures: n/a  Last mammogram: n/a Date: n/a Last pap: n/a Date: n/a OB History: G0   Menstrual History: Menarche age: n/a Patient's last menstrual period was 09/06/2018 (exact date).    Past Medical History:  Diagnosis Date  . Anxiety 01/28/2013   no meds  . Depression    no meds  . IBS (irritable bowel syndrome)   . Migraines    HA every other day - otc med prn  . Recurrent streptococcal tonsillitis   . Seasonal allergies    Seasonal environmental allergies  . Vision abnormalities    Myopia; patient has history of devloping ulcers when she wore contacts.    Past Surgical History:  Procedure Laterality Date  . COLONOSCOPY     IBS    Family History  Problem Relation Age of Onset  . Depression Mother   . Migraines Mother   . Migraines Father     Social History:  reports that she has never smoked. She has never used smokeless tobacco. She reports that she drinks alcohol. She reports that she has current or past drug history. Drug: Marijuana.  Allergies: No Known Allergies  No medications prior to admission.    ROS  Height 5' 2.5" (1.588 m), weight 56.7 kg, last menstrual period 09/06/2018. Physical Exam  Constitutional: She is oriented to person, place, and time. She appears well-developed and  well-nourished.  GI: Soft. There is no rebound and no guarding.  Neurological: She is alert and oriented to person, place, and time.  Skin: Skin is warm and dry.  Psychiatric: She has a normal mood and affect. Her behavior is normal.    No results found for this or any previous visit (from the past 24 hour(s)).  No results found.  Assessment/Plan: 22yo with chronic pelvic pain, dysmenorrhea -Dx L/S with possible removal of endometriosis -Patient has been counseled re: risk for bleeding, infection, scarring, and damage to surrounding structures.  All questions were answered and the patient wishes to proceed.  Mitchel HonourMegan Maximos Zayas 10/19/2018, 6:29 PM

## 2018-10-20 ENCOUNTER — Ambulatory Visit (HOSPITAL_COMMUNITY)
Admission: RE | Admit: 2018-10-20 | Discharge: 2018-10-20 | Disposition: A | Discharge status: Home / Self Care | Payer: No Typology Code available for payment source | Source: Ambulatory Visit | Attending: Obstetrics & Gynecology | Admitting: Obstetrics & Gynecology

## 2018-10-20 ENCOUNTER — Ambulatory Visit (HOSPITAL_COMMUNITY): Payer: No Typology Code available for payment source | Admitting: Anesthesiology

## 2018-10-20 ENCOUNTER — Encounter (HOSPITAL_COMMUNITY)
Admission: RE | Disposition: A | Discharge status: Home / Self Care | Payer: Self-pay | Source: Ambulatory Visit | Attending: Obstetrics & Gynecology

## 2018-10-20 ENCOUNTER — Encounter (HOSPITAL_COMMUNITY): Payer: Self-pay | Admitting: *Deleted

## 2018-10-20 ENCOUNTER — Other Ambulatory Visit: Payer: Self-pay

## 2018-10-20 DIAGNOSIS — F329 Major depressive disorder, single episode, unspecified: Secondary | ICD-10-CM | POA: Diagnosis not present

## 2018-10-20 DIAGNOSIS — R102 Pelvic and perineal pain: Secondary | ICD-10-CM | POA: Diagnosis not present

## 2018-10-20 DIAGNOSIS — N946 Dysmenorrhea, unspecified: Secondary | ICD-10-CM | POA: Diagnosis not present

## 2018-10-20 DIAGNOSIS — K589 Irritable bowel syndrome without diarrhea: Secondary | ICD-10-CM | POA: Diagnosis not present

## 2018-10-20 HISTORY — PX: LAPAROSCOPY: SHX197

## 2018-10-20 LAB — COMPREHENSIVE METABOLIC PANEL
ALT: 19 U/L (ref 0–44)
AST: 24 U/L (ref 15–41)
Albumin: 4.3 g/dL (ref 3.5–5.0)
Alkaline Phosphatase: 40 U/L (ref 38–126)
Anion gap: 9 (ref 5–15)
BUN: 9 mg/dL (ref 6–20)
CHLORIDE: 105 mmol/L (ref 98–111)
CO2: 24 mmol/L (ref 22–32)
CREATININE: 0.83 mg/dL (ref 0.44–1.00)
Calcium: 9.2 mg/dL (ref 8.9–10.3)
GFR calc non Af Amer: >60 mL/min (ref >60)
Glucose, Bld: 102 mg/dL — ABNORMAL HIGH (ref 70–99)
Potassium: 3.5 mmol/L (ref 3.5–5.1)
Sodium: 138 mmol/L (ref 135–145)
Total Bilirubin: 0.7 mg/dL (ref 0.3–1.2)
Total Protein: 8 g/dL (ref 6.5–8.1)

## 2018-10-20 LAB — ABO/RH: ABO/RH(D): O POS

## 2018-10-20 LAB — TYPE AND SCREEN
ABO/RH(D): O POS
ANTIBODY SCREEN: NEGATIVE

## 2018-10-20 LAB — PREGNANCY, URINE: Preg Test, Ur: NEGATIVE

## 2018-10-20 LAB — CBC
HCT: 39.7 % (ref 36.0–46.0)
Hemoglobin: 13.2 g/dL (ref 12.0–15.0)
MCH: 29.2 pg (ref 26.0–34.0)
MCHC: 33.2 g/dL (ref 30.0–36.0)
MCV: 87.8 fL (ref 80.0–100.0)
NRBC: 0 % (ref 0.0–0.2)
PLATELETS: 272 10*3/uL (ref 150–400)
RBC: 4.52 MIL/uL (ref 3.87–5.11)
RDW: 13.7 % (ref 11.5–15.5)
WBC: 5.3 10*3/uL (ref 4.0–10.5)

## 2018-10-20 SURGERY — LAPAROSCOPY, DIAGNOSTIC
Anesthesia: General | Site: Abdomen

## 2018-10-20 MED ORDER — GLYCOPYRROLATE 0.2 MG/ML IJ SOLN
INTRAMUSCULAR | Status: DC | PRN
  Administered 2018-10-20: 0.1 mg via INTRAVENOUS

## 2018-10-20 MED ORDER — ONDANSETRON HCL 4 MG/2ML IJ SOLN
INTRAMUSCULAR | Status: AC
  Filled 2018-10-20: qty 2

## 2018-10-20 MED ORDER — LACTATED RINGERS IV SOLN: INTRAVENOUS | Status: DC

## 2018-10-20 MED ORDER — LACTATED RINGERS IV SOLN
INTRAVENOUS | Status: DC
  Administered 2018-10-20 (×2): via INTRAVENOUS

## 2018-10-20 MED ORDER — OXYCODONE-ACETAMINOPHEN 5-325 MG PO TABS: 1.0000 | ORAL_TABLET | ORAL | 0 refills | Status: DC | PRN

## 2018-10-20 MED ORDER — MIDAZOLAM HCL 2 MG/2ML IJ SOLN
INTRAMUSCULAR | Status: DC | PRN
  Administered 2018-10-20: 2 mg via INTRAVENOUS

## 2018-10-20 MED ORDER — FENTANYL CITRATE (PF) 100 MCG/2ML IJ SOLN
INTRAMUSCULAR | Status: DC | PRN
  Administered 2018-10-20: 100 ug via INTRAVENOUS
  Administered 2018-10-20 (×2): 50 ug via INTRAVENOUS

## 2018-10-20 MED ORDER — SUGAMMADEX SODIUM 200 MG/2ML IV SOLN
INTRAVENOUS | Status: AC
  Filled 2018-10-20: qty 4

## 2018-10-20 MED ORDER — KETOROLAC TROMETHAMINE 30 MG/ML IJ SOLN
INTRAMUSCULAR | Status: AC
  Filled 2018-10-20: qty 1

## 2018-10-20 MED ORDER — LIDOCAINE HCL (CARDIAC) PF 100 MG/5ML IV SOSY
PREFILLED_SYRINGE | INTRAVENOUS | Status: DC | PRN
  Administered 2018-10-20: 80 mg via INTRAVENOUS

## 2018-10-20 MED ORDER — OXYCODONE HCL 5 MG PO TABS
5.0000 mg | ORAL_TABLET | Freq: Once | ORAL | Status: AC
  Administered 2018-10-20: 5 mg via ORAL

## 2018-10-20 MED ORDER — MEPERIDINE HCL 25 MG/ML IJ SOLN: 6.2500 mg | INTRAMUSCULAR | Status: DC | PRN

## 2018-10-20 MED ORDER — ACETAMINOPHEN 500 MG PO TABS
ORAL_TABLET | ORAL | Status: AC
  Administered 2018-10-20: 1000 mg via ORAL
  Filled 2018-10-20: qty 2

## 2018-10-20 MED ORDER — ONDANSETRON HCL 4 MG/2ML IJ SOLN
INTRAMUSCULAR | Status: DC | PRN
  Administered 2018-10-20: 4 mg via INTRAVENOUS

## 2018-10-20 MED ORDER — IBUPROFEN 600 MG PO TABS: 600.0000 mg | ORAL_TABLET | Freq: Four times a day (QID) | ORAL | 0 refills | Status: DC | PRN

## 2018-10-20 MED ORDER — SODIUM CHLORIDE (PF) 0.9 % IJ SOLN
INTRAMUSCULAR | Status: AC
  Filled 2018-10-20: qty 10

## 2018-10-20 MED ORDER — OXYCODONE HCL 5 MG PO TABS
ORAL_TABLET | ORAL | Status: AC
  Filled 2018-10-20: qty 1

## 2018-10-20 MED ORDER — KETOROLAC TROMETHAMINE 30 MG/ML IJ SOLN
30.0000 mg | Freq: Once | INTRAMUSCULAR | Status: AC | PRN
  Administered 2018-10-20: 30 mg via INTRAVENOUS

## 2018-10-20 MED ORDER — HYDROMORPHONE HCL 1 MG/ML IJ SOLN
0.2500 mg | INTRAMUSCULAR | Status: DC | PRN
  Administered 2018-10-20: 1 mg via INTRAVENOUS

## 2018-10-20 MED ORDER — SCOPOLAMINE 1 MG/3DAYS TD PT72
MEDICATED_PATCH | TRANSDERMAL | Status: AC
  Administered 2018-10-20: 1.5 mg via TRANSDERMAL
  Filled 2018-10-20: qty 1

## 2018-10-20 MED ORDER — SODIUM CHLORIDE (PF) 0.9 % IJ SOLN
INTRAMUSCULAR | Status: DC | PRN
  Administered 2018-10-20: 10 mL

## 2018-10-20 MED ORDER — PROPOFOL 10 MG/ML IV BOLUS
INTRAVENOUS | Status: AC
  Filled 2018-10-20: qty 20

## 2018-10-20 MED ORDER — SUGAMMADEX SODIUM 200 MG/2ML IV SOLN
INTRAVENOUS | Status: DC | PRN
  Administered 2018-10-20: 944 mg via INTRAVENOUS

## 2018-10-20 MED ORDER — MIDAZOLAM HCL 2 MG/2ML IJ SOLN
INTRAMUSCULAR | Status: AC
  Filled 2018-10-20: qty 2

## 2018-10-20 MED ORDER — PROPOFOL 10 MG/ML IV BOLUS
INTRAVENOUS | Status: DC | PRN
  Administered 2018-10-20: 150 mg via INTRAVENOUS

## 2018-10-20 MED ORDER — HYDROMORPHONE HCL 1 MG/ML IJ SOLN
INTRAMUSCULAR | Status: AC
  Filled 2018-10-20: qty 1

## 2018-10-20 MED ORDER — BUPIVACAINE HCL (PF) 0.25 % IJ SOLN
INTRAMUSCULAR | Status: DC | PRN
  Administered 2018-10-20: 5 mL

## 2018-10-20 MED ORDER — ROCURONIUM BROMIDE 100 MG/10ML IV SOLN
INTRAVENOUS | Status: AC
  Filled 2018-10-20: qty 1

## 2018-10-20 MED ORDER — METOCLOPRAMIDE HCL 5 MG/ML IJ SOLN
INTRAMUSCULAR | Status: AC
  Filled 2018-10-20: qty 2

## 2018-10-20 MED ORDER — DEXAMETHASONE SODIUM PHOSPHATE 10 MG/ML IJ SOLN
INTRAMUSCULAR | Status: AC
  Filled 2018-10-20: qty 1

## 2018-10-20 MED ORDER — GABAPENTIN 300 MG PO CAPS
300.0000 mg | ORAL_CAPSULE | Freq: Once | ORAL | Status: AC
  Administered 2018-10-20: 300 mg via ORAL

## 2018-10-20 MED ORDER — DEXAMETHASONE SODIUM PHOSPHATE 10 MG/ML IJ SOLN
INTRAMUSCULAR | Status: DC | PRN
  Administered 2018-10-20: 10 mg via INTRAVENOUS

## 2018-10-20 MED ORDER — BUPIVACAINE HCL (PF) 0.25 % IJ SOLN
INTRAMUSCULAR | Status: AC
  Filled 2018-10-20: qty 30

## 2018-10-20 MED ORDER — ACETAMINOPHEN 500 MG PO TABS
1000.0000 mg | ORAL_TABLET | Freq: Once | ORAL | Status: AC
  Administered 2018-10-20: 1000 mg via ORAL

## 2018-10-20 MED ORDER — FENTANYL CITRATE (PF) 250 MCG/5ML IJ SOLN
INTRAMUSCULAR | Status: AC
  Filled 2018-10-20: qty 5

## 2018-10-20 MED ORDER — GABAPENTIN 300 MG PO CAPS
ORAL_CAPSULE | ORAL | Status: AC
  Administered 2018-10-20: 300 mg via ORAL
  Filled 2018-10-20: qty 1

## 2018-10-20 MED ORDER — ONDANSETRON HCL 4 MG/2ML IJ SOLN: 4.0000 mg | Freq: Once | INTRAMUSCULAR | Status: DC | PRN

## 2018-10-20 MED ORDER — HYDROCODONE-ACETAMINOPHEN 7.5-325 MG PO TABS: 1.0000 | ORAL_TABLET | Freq: Once | ORAL | Status: DC | PRN

## 2018-10-20 MED ORDER — SUGAMMADEX SODIUM 500 MG/5ML IV SOLN
INTRAVENOUS | Status: AC
  Filled 2018-10-20: qty 5

## 2018-10-20 MED ORDER — ROCURONIUM BROMIDE 100 MG/10ML IV SOLN
INTRAVENOUS | Status: DC | PRN
  Administered 2018-10-20: 40 mg via INTRAVENOUS

## 2018-10-20 MED ORDER — LIDOCAINE HCL (CARDIAC) PF 100 MG/5ML IV SOSY
PREFILLED_SYRINGE | INTRAVENOUS | Status: AC
  Filled 2018-10-20: qty 5

## 2018-10-20 MED ORDER — LIDOCAINE HCL 1 % IJ SOLN
INTRAMUSCULAR | Status: AC
  Filled 2018-10-20: qty 20

## 2018-10-20 MED ORDER — SCOPOLAMINE 1 MG/3DAYS TD PT72
1.0000 | MEDICATED_PATCH | Freq: Once | TRANSDERMAL | Status: DC
  Administered 2018-10-20: 1.5 mg via TRANSDERMAL

## 2018-10-20 MED FILL — IBUPROFEN 600 MG TABLET: 600 | 7 days supply | Qty: 30 | Fill #0

## 2018-10-20 MED FILL — OXYCODONE-ACETAMINOPHEN 5-3: 5-325 | 2 days supply | Qty: 15 | Fill #0

## 2018-10-20 SURGICAL SUPPLY — 52 items
BARRIER ADHS 3X4 INTERCEED (GAUZE/BANDAGES/DRESSINGS) IMPLANT
BENZOIN TINCTURE PRP APPL 2/3 (GAUZE/BANDAGES/DRESSINGS) ×3 IMPLANT
BLADE SURG CLIPPER 3M 9600 (MISCELLANEOUS) ×3 IMPLANT
CABLE HIGH FREQUENCY MONO STRZ (ELECTRODE) IMPLANT
CANISTER SUCT 3000ML PPV (MISCELLANEOUS) ×3 IMPLANT
CATH ROBINSON RED A/P 16FR (CATHETERS) ×3 IMPLANT
CONT PATH 16OZ SNAP LID 3702 (MISCELLANEOUS) IMPLANT
COVER BACK TABLE 60X90IN (DRAPES) ×3 IMPLANT
DRAPE WARM FLUID 44X44 (DRAPE) IMPLANT
DRSG OPSITE POSTOP 3X4 (GAUZE/BANDAGES/DRESSINGS) IMPLANT
DRSG OPSITE POSTOP 4X10 (GAUZE/BANDAGES/DRESSINGS) IMPLANT
DURAPREP 26ML APPLICATOR (WOUND CARE) ×3 IMPLANT
FORCEPS CUTTING 33CM 5MM (CUTTING FORCEPS) IMPLANT
GAUZE 4X4 16PLY RFD (DISPOSABLE) IMPLANT
GLOVE BIO SURGEON STRL SZ 6 (GLOVE) ×3 IMPLANT
GLOVE BIOGEL PI IND STRL 6 (GLOVE) ×4 IMPLANT
GLOVE BIOGEL PI IND STRL 7.0 (GLOVE) ×2 IMPLANT
GLOVE BIOGEL PI INDICATOR 6 (GLOVE) ×2
GLOVE BIOGEL PI INDICATOR 7.0 (GLOVE) ×1
GOWN STRL REUS W/TWL LRG LVL3 (GOWN DISPOSABLE) ×9 IMPLANT
NEEDLE INSUFFLATION 120MM (ENDOMECHANICALS) ×3 IMPLANT
NEEDLE SPNL 22GX3.5 QUINCKE BK (NEEDLE) IMPLANT
NS IRRIG 1000ML POUR BTL (IV SOLUTION) ×3 IMPLANT
PACK ABDOMINAL GYN (CUSTOM PROCEDURE TRAY) IMPLANT
PACK LAPAROSCOPY BASIN (CUSTOM PROCEDURE TRAY) ×3 IMPLANT
PACK TRENDGUARD 450 HYBRID PRO (MISCELLANEOUS) ×2 IMPLANT
PACK VAGINAL MINOR WOMEN LF (CUSTOM PROCEDURE TRAY) IMPLANT
PAD OB MATERNITY 4.3X12.25 (PERSONAL CARE ITEMS) ×3 IMPLANT
PAD PREP 24X48 CUFFED NSTRL (MISCELLANEOUS) ×3 IMPLANT
POUCH SPECIMEN RETRIEVAL 10MM (ENDOMECHANICALS) IMPLANT
PROTECTOR NERVE ULNAR (MISCELLANEOUS) ×6 IMPLANT
SET IRRIG TUBING LAPAROSCOPIC (IRRIGATION / IRRIGATOR) IMPLANT
SLEEVE XCEL OPT CAN 5 100 (ENDOMECHANICALS) IMPLANT
SPONGE LAP 18X18 RF (DISPOSABLE) IMPLANT
STRIP CLOSURE SKIN 1/2X4 (GAUZE/BANDAGES/DRESSINGS) IMPLANT
STRIP CLOSURE SKIN 1/4X3 (GAUZE/BANDAGES/DRESSINGS) ×3 IMPLANT
SUT MNCRL AB 3-0 PS2 27 (SUTURE) ×3 IMPLANT
SUT MON AB-0 CT1 36 (SUTURE) IMPLANT
SUT PLAIN 2 0 XLH (SUTURE) IMPLANT
SUT VIC AB 0 CT1 18XCR BRD8 (SUTURE) IMPLANT
SUT VIC AB 0 CT1 27 (SUTURE)
SUT VIC AB 0 CT1 27XBRD ANBCTR (SUTURE) IMPLANT
SUT VIC AB 0 CT1 8-18 (SUTURE)
SUT VIC AB 4-0 KS 27 (SUTURE) IMPLANT
SUT VICRYL 0 UR6 27IN ABS (SUTURE) ×3 IMPLANT
TOWEL OR 17X24 6PK STRL BLUE (TOWEL DISPOSABLE) ×6 IMPLANT
TRAY FOLEY W/BAG SLVR 14FR (SET/KITS/TRAYS/PACK) IMPLANT
TRENDGUARD 450 HYBRID PRO PACK (MISCELLANEOUS) ×3
TROCAR XCEL NON-BLD 11X100MML (ENDOMECHANICALS) ×3 IMPLANT
TROCAR XCEL NON-BLD 5MMX100MML (ENDOMECHANICALS) ×3 IMPLANT
TUBING INSUF HEATED (TUBING) ×3 IMPLANT
WARMER LAPAROSCOPE (MISCELLANEOUS) ×3 IMPLANT

## 2018-10-20 NOTE — Op Note (Signed)
Stephanie [name] 10/20/2018  PREOPERATIVE DIAGNOSIS:  Chronic pelvic pain, dysmenorrhea  POSTOPERATIVE DIAGNOSIS:  SAA  PROCEDURE:  Diagnostic laparoscopy  ANESTHESIA:  General endotracheal  COMPLICATIONS:  None immediate.  ESTIMATED BLOOD LOSS:  Less than 20 ml.  INDICATIONS: 22 y.o. with chronic pelvic pain and dysmenorrhea.  The patient has gradually noticed worsening of pain and wishes to proceed with surgical management.     FINDINGS:  Normal uterus, bilateral fallopian tubes and ovaries.  Normal appearing appendix and liver edge.  Small amount (<0.5 cm) of dense scar in posterior culdesac.  TECHNIQUE:  The patient was taken to the operating room where general anesthesia was obtained without difficulty.  She was then placed in the dorsal lithotomy position and prepared and draped in sterile fashion. Bladder was catheterized of 10 cc of urine.  After an adequate timeout was performed, a bivalved speculum was then placed in the patient's vagina, and the anterior lip of cervix grasped with the single-tooth tenaculum.  The hulka clip was advanced into the uterus.  The speculum was removed from the vagina.  Attention was then turned to the patient's abdomen where a 10-mm skin incision was made on the umbilical fold.  The Veress needle was carefully introduced into the peritoneal cavity through the abdominal wall.  Intraperitoneal placement was confirmed by drop in intraabdominal pressure with insufflation of carbon dioxide gas.  Adequate pneumoperitoneum was obtained, and the 11 XL trocar was then advanced without difficulty into the abdomen where intraabdominal placement was confirmed by the operative laparoscope.  A 5 mm skin incision was then made in the suprapubic region.  5 mm trocar was advanced under direct visualization atraumatically.  Survey of the abdomen and pelvis was performed noting the above dictated findings.  No biopsies were collected. All instruments were removed  from the abdomen.  Umbilical fascia incision was closed using 0 vicryl in a figure of eight stitch.  Skin incisions x 2 were closed in a subcuticular fashion using 2-0 monocryl.  Dermabond was applied to both incisions.    The uterine manipulator and the tenaculum were removed from the vagina without complications. The patient tolerated the procedure well.  Sponge, lap, and needle counts were correct times two.  The patient was then taken to the recovery room awake, extubated and in stable  in stable condition.

## 2018-10-20 NOTE — Anesthesia Procedure Notes (Signed)
Procedure Name: Intubation Date/Time: 10/20/2018 7:38 AM Performed by: Flossie Dibble, CRNA Pre-anesthesia Checklist: Patient identified, Patient being monitored, Timeout performed, Emergency Drugs available and Suction available Patient Re-evaluated:Patient Re-evaluated prior to induction Oxygen Delivery Method: Circle System Utilized Preoxygenation: Pre-oxygenation with 100% oxygen Induction Type: IV induction Ventilation: Mask ventilation without difficulty Laryngoscope Size: Mac and 3 Grade View: Grade I Tube type: Oral Tube size: 7.0 mm Number of attempts: 1 Airway Equipment and Method: stylet Placement Confirmation: ETT inserted through vocal cords under direct vision,  positive ETCO2 and breath sounds checked- equal and bilateral Secured at: 21 cm Tube secured with: Tape Dental Injury: Teeth and Oropharynx as per pre-operative assessment

## 2018-10-20 NOTE — Transfer of Care (Signed)
Immediate Anesthesia Transfer of Care Note  Patient: Stephanie [name]  Procedure(s) Performed: LAPAROSCOPY DIAGNOSTIC possible removal of endometriosis (N/A Abdomen)  Patient Location: PACU  Anesthesia Type:General  Level of Consciousness: awake, alert  and oriented  Airway & Oxygen Therapy: Patient Spontanous Breathing and Patient connected to nasal cannula oxygen  Post-op Assessment: Report given to RN and Post -op Vital signs reviewed and stable  Post vital signs: Reviewed and stable  Last Vitals:  Vitals Value Taken Time  BP 125/57 10/20/2018  8:46 AM  Temp    Pulse 99 10/20/2018  8:53 AM  Resp 14 10/20/2018  8:53 AM  SpO2 100 % 10/20/2018  8:53 AM  Vitals shown include unvalidated device data.  Last Pain:  Vitals:   10/20/18 0620  TempSrc: Oral  PainSc: 0-No pain      Patients Stated Pain Goal: 2 (10/20/18 16100620)  Complications: No apparent anesthesia complications

## 2018-10-20 NOTE — Progress Notes (Signed)
No change to H&P.  Yazleemar Strassner, DO 

## 2018-10-20 NOTE — Anesthesia Postprocedure Evaluation (Signed)
Anesthesia Post Note  Patient: Stephanie [name]  Procedure(s) Performed: LAPAROSCOPY DIAGNOSTIC possible removal of endometriosis (N/A Abdomen)     Patient location during evaluation: PACU Anesthesia Type: General Level of consciousness: awake and alert Pain management: pain level controlled Vital Signs Assessment: post-procedure vital signs reviewed and stable Respiratory status: spontaneous breathing, nonlabored ventilation, respiratory function stable and patient connected to nasal cannula oxygen Cardiovascular status: blood pressure returned to baseline and stable Postop Assessment: no apparent nausea or vomiting Anesthetic complications: no    Last Vitals:  Vitals:   10/20/18 0946 10/20/18 1015  BP:  118/63  Pulse: (!) 119 (!) 104  Resp: 13 16  Temp:  36.8 C  SpO2: 98% 100%    Last Pain:  Vitals:   10/20/18 1015  TempSrc:   PainSc: 0-No pain   Pain Goal: Patients Stated Pain Goal: 2 (10/20/18 0935)               Trevor IhaStephen A Marjo Grosvenor

## 2018-10-20 NOTE — Discharge Instructions (Signed)
Call MD for T>100.4, heavy vaginal bleeding, severe abdominal pain, intractable nausea and/or vomiting, or respiratory distress.  Call office to schedule postop incision check in 2 weeks.  Pelvic rest x 4 weeks.  No driving while taking narcotics.   DISCHARGE INSTRUCTIONS: Laparoscopy  The following instructions have been prepared to help you care for yourself upon your return home today.  Wound care:  Do not get the incision wet for the first 24 hours. The incision should be kept clean and dry.  The Band-Aids or dressings may be removed the day after surgery.  Should the incision become sore, red, and swollen after the first week, check with your doctor.  Personal hygiene:  Shower the day after your procedure.  Activity and limitations:  Do NOT drive or operate any equipment today.  Do NOT lift anything more than 15 pounds for 2-3 weeks after surgery.  Do NOT rest in bed all day.  Walking is encouraged. Walk each day, starting slowly with 5-minute walks 3 or 4 times a day. Slowly increase the length of your walks.  Walk up and down stairs slowly.  Do NOT do strenuous activities, such as golfing, playing tennis, bowling, running, biking, weight lifting, gardening, mowing, or vacuuming for 2-4 weeks. Ask your doctor when it is okay to start.  Return to work: This is dependent on the type of work you do. For the most part you can return to a desk job within a week of surgery. If you are more active at work, please discuss this with your doctor.  What to expect after your surgery: You may have a slight burning sensation when you urinate on the first day. You may have a very small amount of blood in the urine. Expect to have a small amount of vaginal discharge/light bleeding for 1-2 weeks. It is not unusual to have abdominal soreness and bruising for up to 2 weeks. You may be tired and need more rest for about 1 week. You may experience shoulder pain for 24-72 hours. Lying flat in bed  may relieve it.  Call your doctor for any of the following:  Develop a fever of 100.4 or greater  Inability to urinate 6 hours after discharge from hospital  Severe pain not relieved by pain medications  Persistent of heavy bleeding at incision site  Redness or swelling around incision site after a week  Increasing nausea or vomiting  Post Anesthesia Home Care Instructions  Activity: Get plenty of rest for the remainder of the day. A responsible individual must stay with you for 24 hours following the procedure.  For the next 24 hours, DO NOT: -Drive a car -Advertising copywriterperate machinery -Drink alcoholic beverages -Take any medication unless instructed by your physician -Make any legal decisions or sign important papers.  Meals: Start with liquid foods such as gelatin or soup. Progress to regular foods as tolerated. Avoid greasy, spicy, heavy foods. If nausea and/or vomiting occur, drink only clear liquids until the nausea and/or vomiting subsides. Call your physician if vomiting continues.  Special Instructions/Symptoms: Your throat may feel dry or sore from the anesthesia or the breathing tube placed in your throat during surgery. If this causes discomfort, gargle with warm salt water. The discomfort should disappear within 24 hours.  If you had a scopolamine patch placed behind your ear for the management of post- operative nausea and/or vomiting:  1. The medication in the patch is effective for 72 hours, after which it should be removed.  Wrap patch  in a tissue and discard in the trash. Wash hands thoroughly with soap and water. 2. You may remove the patch earlier than 72 hours if you experience unpleasant side effects which may include dry mouth, dizziness or visual disturbances. 3. Avoid touching the patch. Wash your hands with soap and water after contact with the patch.

## 2018-10-21 ENCOUNTER — Encounter (HOSPITAL_COMMUNITY): Payer: Self-pay | Admitting: Obstetrics & Gynecology

## 2018-10-22 ENCOUNTER — Telehealth: Payer: Self-pay | Admitting: *Deleted

## 2018-10-22 ENCOUNTER — Emergency Department (HOSPITAL_COMMUNITY)
Admission: EM | Admit: 2018-10-22 | Discharge: 2018-10-22 | Disposition: A | Discharge status: Home / Self Care | Payer: No Typology Code available for payment source | Attending: Emergency Medicine | Admitting: Emergency Medicine

## 2018-10-22 ENCOUNTER — Emergency Department (HOSPITAL_COMMUNITY): Payer: No Typology Code available for payment source

## 2018-10-22 ENCOUNTER — Other Ambulatory Visit: Payer: Self-pay

## 2018-10-22 ENCOUNTER — Encounter (HOSPITAL_COMMUNITY): Payer: Self-pay

## 2018-10-22 DIAGNOSIS — R1011 Right upper quadrant pain: Secondary | ICD-10-CM | POA: Insufficient documentation

## 2018-10-22 DIAGNOSIS — J302 Other seasonal allergic rhinitis: Secondary | ICD-10-CM | POA: Insufficient documentation

## 2018-10-22 DIAGNOSIS — Z79899 Other long term (current) drug therapy: Secondary | ICD-10-CM | POA: Diagnosis not present

## 2018-10-22 DIAGNOSIS — R079 Chest pain, unspecified: Secondary | ICD-10-CM | POA: Diagnosis present

## 2018-10-22 LAB — CBC WITH DIFFERENTIAL/PLATELET
Abs Immature Granulocytes: 0.01 10*3/uL (ref 0.00–0.07)
Basophils Absolute: 0 10*3/uL (ref 0.0–0.1)
Basophils Relative: 0 %
Eosinophils Absolute: 0 10*3/uL (ref 0.0–0.5)
Eosinophils Relative: 0 %
HCT: 40.7 % (ref 36.0–46.0)
Hemoglobin: 12.9 g/dL (ref 12.0–15.0)
Immature Granulocytes: 0 %
Lymphocytes Relative: 37 %
Lymphs Abs: 2 10*3/uL (ref 0.7–4.0)
MCH: 27.9 pg (ref 26.0–34.0)
MCHC: 31.7 g/dL (ref 30.0–36.0)
MCV: 87.9 fL (ref 80.0–100.0)
Monocytes Absolute: 0.3 10*3/uL (ref 0.1–1.0)
Monocytes Relative: 5 %
Neutro Abs: 3.2 10*3/uL (ref 1.7–7.7)
Neutrophils Relative %: 58 %
Platelets: 261 10*3/uL (ref 150–400)
RBC: 4.63 MIL/uL (ref 3.87–5.11)
RDW: 12.9 % (ref 11.5–15.5)
WBC: 5.6 10*3/uL (ref 4.0–10.5)
nRBC: 0 % (ref 0.0–0.2)

## 2018-10-22 LAB — URINALYSIS, ROUTINE W REFLEX MICROSCOPIC
Bacteria, UA: NONE SEEN
Bilirubin Urine: NEGATIVE
Glucose, UA: NEGATIVE mg/dL
Ketones, ur: 20 mg/dL — AB
Leukocytes, UA: NEGATIVE
Nitrite: NEGATIVE
Protein, ur: NEGATIVE mg/dL
Specific Gravity, Urine: 1.011 (ref 1.005–1.030)
pH: 7 (ref 5.0–8.0)

## 2018-10-22 LAB — COMPREHENSIVE METABOLIC PANEL
ALT: 16 U/L (ref 0–44)
AST: 20 U/L (ref 15–41)
Albumin: 3.5 g/dL (ref 3.5–5.0)
Alkaline Phosphatase: 34 U/L — ABNORMAL LOW (ref 38–126)
Anion gap: 9 (ref 5–15)
BUN: <5 mg/dL — ABNORMAL LOW (ref 6–20)
CO2: 23 mmol/L (ref 22–32)
Calcium: 9.1 mg/dL (ref 8.9–10.3)
Chloride: 105 mmol/L (ref 98–111)
Creatinine, Ser: 0.83 mg/dL (ref 0.44–1.00)
GFR calc Af Amer: >60 mL/min (ref >60)
GFR calc non Af Amer: >60 mL/min (ref >60)
Glucose, Bld: 78 mg/dL (ref 70–99)
Potassium: 3.6 mmol/L (ref 3.5–5.1)
Sodium: 137 mmol/L (ref 135–145)
Total Bilirubin: 0.7 mg/dL (ref 0.3–1.2)
Total Protein: 6.7 g/dL (ref 6.5–8.1)

## 2018-10-22 LAB — LIPASE, BLOOD: Lipase: 22 U/L (ref 11–51)

## 2018-10-22 LAB — I-STAT BETA HCG BLOOD, ED (MC, WL, AP ONLY): I-stat hCG, quantitative: <5 m[IU]/mL (ref <5)

## 2018-10-22 MED ORDER — IOPAMIDOL (ISOVUE-370) INJECTION 76%
100.0000 mL | Freq: Once | INTRAVENOUS | Status: AC | PRN
  Administered 2018-10-22: 100 mL via INTRAVENOUS

## 2018-10-22 MED ORDER — MORPHINE SULFATE (PF) 4 MG/ML IV SOLN
4.0000 mg | Freq: Once | INTRAVENOUS | Status: AC
  Administered 2018-10-22: 4 mg via INTRAVENOUS
  Filled 2018-10-22: qty 1

## 2018-10-22 NOTE — ED Triage Notes (Signed)
Pt. Developed sore throat and cough and cold symptoms Saturday.   Cough is productive and green.  ? Fevers.  She developed pain under her rt. Breast and increases with movement and inspiration.  Skin is warm and dry.  Periods of sweating.

## 2018-10-22 NOTE — Telephone Encounter (Signed)
Spoke to pt who c/o sharp chest pain, under breast. States she spoke to team health last night appx 10pm and was advised to go ED, but pt refused. Pt denies dizziness, but states she has had some ShOB when coughing. I informed her there were only appts available this afternoon, and she didn't need to wait to be seen. Pt [facility]

## 2018-10-22 NOTE — Telephone Encounter (Signed)
Noted. [facility] and is currently being worked up.

## 2018-10-22 NOTE — Telephone Encounter (Signed)
[facility] faxed note; Marcelline MatesWaynetta CMA has already spoken with pt and per pt chart review tab [facility] is [facility]. FYI to Allayne GitelmanK Clark NP.

## 2018-10-22 NOTE — ED Notes (Signed)
Pt aware we need urine. Sample cup at bedside. Pt states she will most likely not have to go "anytime soon"

## 2018-10-22 NOTE — Discharge Instructions (Addendum)
Please read attached information. If you experience any new or worsening signs or symptoms please return to the emergency room for evaluation. Please follow-up with your primary care provider or specialist as discussed. Please use previously prescribed medication only as directed and discontinue taking if you have any concerning signs or symptoms.   °

## 2018-10-22 NOTE — ED Notes (Signed)
Patient transported to CT 

## 2018-10-22 NOTE — ED Provider Notes (Signed)
[facility] Provider Note   CSN: [id number] Arrival date & time: 10/22/18  0913     History   Chief Complaint Chief Complaint  Patient presents with  . Abdominal Pain  . Cough    HPI Stephanie [name] is a 22 y.o. female.  HPI   22 year old female presents today with complaints of chest pain, abdominal pain and shortness of breath.  Patient notes that she had diagnostic laparoscopic for chronic pelvic pain and dysmenorrhea.  On 10/20/2018 by Dr. Langston MaskerMorris.  She notes that after the surgery she had minor right shoulder pain which has improved, she notes yesterday she developed pain to her right upper quadrant right lower chest, she notes this is associated with dry nonproductive cough, shortness of breath and feelings of being hot.  She notes the pain is worse with palpation of the right upper quadrant, worse with deep inspiration.  Patient notes she has Nexplanon for birth control, notes no smoking history, no history of DVT or PE previously.  Denies any lower extremity swelling or edema.  She denies any urinary symptoms.   Past Medical History:  Diagnosis Date  . Anxiety 01/28/2013   no meds  . Depression    no meds  . IBS (irritable bowel syndrome)   . Migraines    HA every other day - otc med prn  . Recurrent streptococcal tonsillitis   . Seasonal allergies    Seasonal environmental allergies  . Vision abnormalities    Myopia; patient has history of devloping ulcers when she wore contacts.    Patient Active Problem List   Diagnosis Date Noted  . Abscess 08/04/2018  . Sebaceous cyst 08/04/2018  . Preventative health care 11/20/2017  . Menorrhagia with regular cycle 09/24/2014  . Migraines 08/04/2014  . Abnormal vision screen 04/03/2013  . Loss of weight 04/03/2013  . Allergic rhinitis 04/03/2013  . MDD (major depressive disorder), single episode, severe (HCC) 01/28/2013    Class: Chronic  . Generalized anxiety disorder  01/28/2013    Past Surgical History:  Procedure Laterality Date  . COLONOSCOPY     IBS  . COLONOSCOPY     IBS  . LAPAROSCOPY N/A 10/20/2018   Procedure: LAPAROSCOPY DIAGNOSTIC possible removal of endometriosis;  Surgeon: Mitchel HonourMorris, Megan, DO;  Location: [facility];  Service: Gynecology;  Laterality: N/A;     OB History   None      Home Medications    Prior to Admission medications   Medication Sig Start Date End Date Taking? Authorizing Provider  docusate sodium (COLACE) 100 MG capsule Take 100 mg by mouth daily as needed for mild constipation.   Yes [provider]  etonogestrel (NEXPLANON) 68 MG IMPL implant 1 each by Subdermal route once.   Yes [provider]  ibuprofen (ADVIL,MOTRIN) 600 MG tablet Take 1 tablet (600 mg total) by mouth every 6 (six) hours as needed. Patient taking differently: Take 600 mg by mouth every 6 (six) hours as needed for mild pain.  10/20/18  Yes Morris, Megan, DO  oxyCODONE-acetaminophen (PERCOCET) 5-325 MG tablet Take 1-2 tablets by mouth every 4 (four) hours as needed for severe pain. 10/20/18  Yes Morris, Megan, DO  simethicone (MYLICON) 80 MG chewable tablet Chew 80 mg by mouth every 6 (six) hours as needed for flatulence.   Yes [provider]    Family History Family History  Problem Relation Age of Onset  . Depression Mother   . Migraines Mother   .  Migraines Father     Social History Social History   Tobacco Use  . Smoking status: Never Smoker  . Smokeless tobacco: Never Used  Substance Use Topics  . Alcohol use: Yes    Comment: socially  . Drug use: Yes    Types: Marijuana    Comment: Last use Thursday 10/16/18     Allergies   Patient has no known allergies.   Review of Systems Review of Systems  All other systems reviewed and are negative.  Physical Exam Updated Vital Signs BP (!) 152/83 (BP Location: Right Arm)   Pulse (!) 103   Temp 98.4 F (36.9 C) (Oral)   Resp 18   Ht 5\' 2"  (1.575 m)    Wt 56.7 kg   LMP 10/09/2018 (Exact Date)   SpO2 100%   BMI 22.86 kg/m   Physical Exam  Constitutional: She is oriented to person, place, and time. She appears well-developed and well-nourished.  HENT:  Head: Normocephalic and atraumatic.  Eyes: Pupils are equal, round, and reactive to light. Conjunctivae are normal. Right eye exhibits no discharge. Left eye exhibits no discharge. No scleral icterus.  Neck: Normal range of motion. No JVD present. No tracheal deviation present.  Pulmonary/Chest: Effort normal. No stridor. No respiratory distress. She has no wheezes. She has no rales. She exhibits no tenderness.  Abdominal:  Tenderness palpation right upper quadrant-Minor tenderness palpation of periumbilical and suprapubic regions-surgical incisions clean without discharge or erythema  Musculoskeletal: She exhibits no edema.  Neurological: She is alert and oriented to person, place, and time. Coordination normal.  Psychiatric: She has a normal mood and affect. Her behavior is normal. Judgment and thought content normal.  Nursing note and vitals reviewed.    ED Treatments / Results  Labs (all labs ordered are listed, but only abnormal results are displayed) Labs Reviewed  COMPREHENSIVE METABOLIC PANEL - Abnormal; Notable for the following components:      Result Value   BUN <5 (*)    Alkaline Phosphatase 34 (*)    All other components within normal limits  URINALYSIS, ROUTINE W REFLEX MICROSCOPIC - Abnormal; Notable for the following components:   Color, Urine STRAW (*)    Hgb urine dipstick SMALL (*)    Ketones, ur 20 (*)    All other components within normal limits  CBC WITH DIFFERENTIAL/PLATELET  LIPASE, BLOOD  I-STAT BETA HCG BLOOD, ED ([facility], [facility], [facility] ONLY)  POC URINE PREG, ED    EKG None  Radiology Ct Angio Chest Pe W And/or Wo Contrast  Result Date: 10/22/2018 CLINICAL DATA:  Initial evaluation for acute left-sided chest pain since yesterday, shortness of breath,  productive cough. History of recent laparoscopy on 10/20/2018. EXAM: CT ANGIOGRAPHY CHEST CT ABDOMEN AND PELVIS WITH CONTRAST TECHNIQUE: Multidetector CT imaging of the chest was performed using the standard protocol during bolus administration of intravenous contrast. Multiplanar CT image reconstructions and MIPs were obtained to evaluate the vascular anatomy. Multidetector CT imaging of the abdomen and pelvis was performed using the standard protocol during bolus administration of intravenous contrast. CONTRAST:  ISOVUE-370 IOPAMIDOL (ISOVUE-370) INJECTION 76% COMPARISON:  Prior radiograph from 10/19/2014 FINDINGS: CTA CHEST FINDINGS Cardiovascular: Intrathoracic aorta of normal caliber without aneurysm or other acute finding. Visualized great vessels within normal limits. Heart size normal. No pericardial effusion. Pulmonary arterial tree adequately opacified for evaluation. Main pulmonary artery within normal limits for caliber. No filling defect to suggest acute pulmonary embolism. Re-formatted imaging confirms these findings. Mediastinum/Nodes: Partially visualized thyroid  within normal limits. No enlarged mediastinal, hilar, or axillary lymph nodes identified. Esophagus within normal limits. Lungs/Pleura: Tracheobronchial tree widely patent and intact. Lungs well inflated bilaterally. No focal infiltrates, pulmonary edema, or pleural effusion. No pneumothorax. Single punctate 3 mm subpleural nodule present at the posterior left lower lobe (series 6, image 71), of doubtful significance given patient age. No other pulmonary nodule or mass. Musculoskeletal: External soft tissues within normal limits. Osseous structures demonstrate no acute finding. No discrete lytic or blastic osseous lesions. Review of the MIP images confirms the above findings. CT ABDOMEN and PELVIS FINDINGS Hepatobiliary: Liver demonstrates a normal contrast enhanced appearance. Gallbladder within normal limits. No biliary dilatation.  Pancreas: Pancreas within normal limits. Spleen: Spleen within normal limits. Adrenals/Urinary Tract: Adrenal glands are normal. Kidneys equal in size with symmetric enhancement. No nephrolithiasis, hydronephrosis, or focal enhancing renal mass. No hydroureter. Partially distended bladder demonstrates no acute finding. Secreted IV contrast material noted within the posterior bladder lumen. Stomach/Bowel: Stomach within normal limits. No evidence for bowel obstruction. No findings to suggest acute appendicitis. Descending colon is largely decompressed with associated mild circumferential wall thickening. No other acute inflammatory changes seen elsewhere about the bowels. Vascular/Lymphatic: Normal intravascular enhancement seen throughout the intra-abdominal aorta. Mesenteric vessels patent proximally. No adenopathy. Reproductive: Uterus within normal limits. Right ovary unremarkable. 2 cm left ovarian corpus luteal cyst noted. Other: Scattered foci of free air seen throughout the upper abdomen, likely postoperative in nature related to recent laparoscopy. No significant free fluid. No postoperative collections or other complication. Musculoskeletal: Small area of focal skin thickening noted along the midline of the lower mid abdomen/pelvis (series 12, image 62), indeterminate. External soft tissues otherwise unremarkable. No acute osseus abnormality. No discrete lytic or blastic osseous lesions. Review of the MIP images confirms the above findings. IMPRESSION: 1. No CT evidence for acute pulmonary embolism. No other acute cardiopulmonary abnormality identified within the chest. 2. Scattered foci of free intraperitoneal air scattered throughout the upper abdomen, presumably related to recent laparoscopy. No loculated intra-abdominal collections or other complication identified status post surgery. 3. No other acute abnormality within the abdomen and pelvis. Results were discussed by telephone at the time of  interpretation on 10/22/2018 at 1:20 pm with the emergency room physician. Electronically Signed   By: Rise Mu M.D.   On: 10/22/2018 13:22   Ct Abdomen Pelvis W Contrast  Result Date: 10/22/2018 CLINICAL DATA:  Initial evaluation for acute left-sided chest pain since yesterday, shortness of breath, productive cough. History of recent laparoscopy on 10/20/2018. EXAM: CT ANGIOGRAPHY CHEST CT ABDOMEN AND PELVIS WITH CONTRAST TECHNIQUE: Multidetector CT imaging of the chest was performed using the standard protocol during bolus administration of intravenous contrast. Multiplanar CT image reconstructions and MIPs were obtained to evaluate the vascular anatomy. Multidetector CT imaging of the abdomen and pelvis was performed using the standard protocol during bolus administration of intravenous contrast. CONTRAST:  ISOVUE-370 IOPAMIDOL (ISOVUE-370) INJECTION 76% COMPARISON:  Prior radiograph from 10/19/2014 FINDINGS: CTA CHEST FINDINGS Cardiovascular: Intrathoracic aorta of normal caliber without aneurysm or other acute finding. Visualized great vessels within normal limits. Heart size normal. No pericardial effusion. Pulmonary arterial tree adequately opacified for evaluation. Main pulmonary artery within normal limits for caliber. No filling defect to suggest acute pulmonary embolism. Re-formatted imaging confirms these findings. Mediastinum/Nodes: Partially visualized thyroid within normal limits. No enlarged mediastinal, hilar, or axillary lymph nodes identified. Esophagus within normal limits. Lungs/Pleura: Tracheobronchial tree widely patent and intact. Lungs well inflated bilaterally. No focal infiltrates,  pulmonary edema, or pleural effusion. No pneumothorax. Single punctate 3 mm subpleural nodule present at the posterior left lower lobe (series 6, image 71), of doubtful significance given patient age. No other pulmonary nodule or mass. Musculoskeletal: External soft tissues within normal  limits. Osseous structures demonstrate no acute finding. No discrete lytic or blastic osseous lesions. Review of the MIP images confirms the above findings. CT ABDOMEN and PELVIS FINDINGS Hepatobiliary: Liver demonstrates a normal contrast enhanced appearance. Gallbladder within normal limits. No biliary dilatation. Pancreas: Pancreas within normal limits. Spleen: Spleen within normal limits. Adrenals/Urinary Tract: Adrenal glands are normal. Kidneys equal in size with symmetric enhancement. No nephrolithiasis, hydronephrosis, or focal enhancing renal mass. No hydroureter. Partially distended bladder demonstrates no acute finding. Secreted IV contrast material noted within the posterior bladder lumen. Stomach/Bowel: Stomach within normal limits. No evidence for bowel obstruction. No findings to suggest acute appendicitis. Descending colon is largely decompressed with associated mild circumferential wall thickening. No other acute inflammatory changes seen elsewhere about the bowels. Vascular/Lymphatic: Normal intravascular enhancement seen throughout the intra-abdominal aorta. Mesenteric vessels patent proximally. No adenopathy. Reproductive: Uterus within normal limits. Right ovary unremarkable. 2 cm left ovarian corpus luteal cyst noted. Other: Scattered foci of free air seen throughout the upper abdomen, likely postoperative in nature related to recent laparoscopy. No significant free fluid. No postoperative collections or other complication. Musculoskeletal: Small area of focal skin thickening noted along the midline of the lower mid abdomen/pelvis (series 12, image 62), indeterminate. External soft tissues otherwise unremarkable. No acute osseus abnormality. No discrete lytic or blastic osseous lesions. Review of the MIP images confirms the above findings. IMPRESSION: 1. No CT evidence for acute pulmonary embolism. No other acute cardiopulmonary abnormality identified within the chest. 2. Scattered foci of free  intraperitoneal air scattered throughout the upper abdomen, presumably related to recent laparoscopy. No loculated intra-abdominal collections or other complication identified status post surgery. 3. No other acute abnormality within the abdomen and pelvis. Results were discussed by telephone at the time of interpretation on 10/22/2018 at 1:20 pm with the emergency room physician. Electronically Signed   By: Rise Mu M.D.   On: 10/22/2018 13:22    Procedures Procedures (including critical care time)  Medications Ordered in ED Medications  morphine 4 MG/ML injection 4 mg (4 mg Intravenous Given 10/22/18 1048)  iopamidol (ISOVUE-370) 76 % injection 100 mL (100 mLs Intravenous Contrast Given 10/22/18 1201)     Initial Impression / Assessment and Plan / ED Course  I have reviewed the triage vital signs and the nursing notes.  Pertinent labs & imaging results that were available during my care of the patient were reviewed by me and considered in my medical decision making (see chart for details).     Labs: I-STAT beta-hCG, CBC, CMP, lipase, UA  Imaging: CT angios chest PE, CT abdomen pelvis  Consults:  Therapeutics: Morphine  Discharge Meds:   Assessment/Plan: 22 year old female presents today with complaints of right upper quadrant and lower chest pain.  Certain for pulmonary embolism given previous procedure and shortness of breath.  Her vital signs are reassuring with 100% oxygen saturation.  Her CT angios showing no acute pulmonary embolism in her anterior pulmonary etiology.  Patient also having right upper quadrant abdominal pain reassuring evaluation with no acute abnormalities, labs reassuring with no signs of infection.  Patient likely having diet for diaphragmatic irritation.  Patient will consults OB/GYN for ongoing evaluation and management she is given strict return precautions.  Both her and her  mother verbalized understanding and agreement to today's plan had no  further questions or concerns at time discharge.   Final Clinical Impressions(s) / ED Diagnoses   Final diagnoses:  Right upper quadrant abdominal pain    ED Discharge Orders    None       Eyvonne Mechanic, PA-C 10/22/18 1405    Geoffery Lyons, MD 10/22/18 1443

## 2018-10-22 NOTE — ED Notes (Signed)
Pt returned from CT at this time.  

## 2018-10-24 MED FILL — AZITHROMYCIN 250 MG TABLET: 250 | 5 days supply | Qty: 6 | Fill #0

## 2018-12-28 ENCOUNTER — Emergency Department (HOSPITAL_COMMUNITY)
Admission: EM | Admit: 2018-12-28 | Discharge: 2018-12-28 | Disposition: A | Discharge status: Home / Self Care | Payer: No Typology Code available for payment source | Attending: Emergency Medicine | Admitting: Emergency Medicine

## 2018-12-28 ENCOUNTER — Other Ambulatory Visit: Payer: Self-pay

## 2018-12-28 ENCOUNTER — Encounter (HOSPITAL_COMMUNITY): Payer: Self-pay | Admitting: Emergency Medicine

## 2018-12-28 DIAGNOSIS — L0291 Cutaneous abscess, unspecified: Secondary | ICD-10-CM

## 2018-12-28 DIAGNOSIS — Z79899 Other long term (current) drug therapy: Secondary | ICD-10-CM | POA: Diagnosis not present

## 2018-12-28 DIAGNOSIS — R2232 Localized swelling, mass and lump, left upper limb: Secondary | ICD-10-CM | POA: Diagnosis present

## 2018-12-28 DIAGNOSIS — L02412 Cutaneous abscess of left axilla: Secondary | ICD-10-CM | POA: Insufficient documentation

## 2018-12-28 MED ORDER — BACITRACIN ZINC 500 UNIT/GM EX OINT
TOPICAL_OINTMENT | Freq: Once | CUTANEOUS | Status: AC
  Administered 2018-12-28: 23:00:00 via TOPICAL

## 2018-12-28 MED ORDER — HYDROCODONE-ACETAMINOPHEN 5-325 MG PO TABS: 1.0000 | ORAL_TABLET | Freq: Four times a day (QID) | ORAL | 0 refills | Status: DC | PRN

## 2018-12-28 MED ORDER — SULFAMETHOXAZOLE-TRIMETHOPRIM 800-160 MG PO TABS: 1.0000 | ORAL_TABLET | Freq: Two times a day (BID) | ORAL | 0 refills | Status: AC

## 2018-12-28 MED ORDER — LIDOCAINE-EPINEPHRINE (PF) 2 %-1:200000 IJ SOLN
10.0000 mL | Freq: Once | INTRAMUSCULAR | Status: AC
  Administered 2018-12-28: 10 mL
  Filled 2018-12-28: qty 20

## 2018-12-28 NOTE — ED Triage Notes (Signed)
Pt states began noticing a red, swollen area to her L underarm on Monday of last week. Has used ibuprofen and warm compresses with no improvement. Swelling & pain have worsened and spread, limiting ROM to LUE. Denies fevers or drainage from area.

## 2018-12-28 NOTE — ED Provider Notes (Signed)
[facility] EMERGENCY DEPARTMENT Provider Note   CSN: [id number] Arrival date & time: 12/28/18  2055     History   Chief Complaint No chief complaint on file.   HPI Loma Dubuque is a 23 y.o. female.  The history is provided by the patient and medical records. No language interpreter was used.   Makyiah Lie is a 23 y.o. female who presents the emergency department complaining of red, swollen, painful area to the left underarm that she believes is an abscess.  Started about 1 week ago.  At that time it was very small like a pimple, but the swelling has continued to worsen.  This morning, she had pain with movement of her arm, therefore decided to come to the emergency department.  She has tried warm compresses and ibuprofen with no improvement.  She has had one area similar to this which started draining on its own with warm compresses.  She has never needed I&D before.  Denies any fever.  No recent illness.  No history of diabetes or other immune compromised state,   Past Medical History:  Diagnosis Date  . Anxiety 01/28/2013   no meds  . Depression    no meds  . IBS (irritable bowel syndrome)   . Migraines    HA every other day - otc med prn  . Recurrent streptococcal tonsillitis   . Seasonal allergies    Seasonal environmental allergies  . Vision abnormalities    Myopia; patient has history of devloping ulcers when she wore contacts.    Patient Active Problem List   Diagnosis Date Noted  . Abscess 08/04/2018  . Sebaceous cyst 08/04/2018  . Preventative health care 11/20/2017  . Menorrhagia with regular cycle 09/24/2014  . Migraines 08/04/2014  . Abnormal vision screen 04/03/2013  . Loss of weight 04/03/2013  . Allergic rhinitis 04/03/2013  . MDD (major depressive disorder), single episode, severe (HCC) 01/28/2013    Class: Chronic  . Generalized anxiety disorder 01/28/2013    Past Surgical History:  Procedure Laterality Date  .  COLONOSCOPY     IBS  . COLONOSCOPY     IBS  . LAPAROSCOPY N/A 10/20/2018   Procedure: LAPAROSCOPY DIAGNOSTIC possible removal of endometriosis;  Surgeon: Mitchel Honour, DO;  Location: [facility];  Service: Gynecology;  Laterality: N/A;     OB History   No obstetric history on file.      Home Medications    Prior to Admission medications   Medication Sig Start Date End Date Taking? Authorizing Provider  docusate sodium (COLACE) 100 MG capsule Take 100 mg by mouth daily as needed for mild constipation.    [provider]  etonogestrel (NEXPLANON) 68 MG IMPL implant 1 each by Subdermal route once.    [provider]  HYDROcodone-acetaminophen (NORCO) 5-325 MG tablet Take 1 tablet by mouth every 6 (six) hours as needed for moderate pain. 12/28/18   Akshara Blumenthal, Chase Picket, PA-C  ibuprofen (ADVIL,MOTRIN) 600 MG tablet Take 1 tablet (600 mg total) by mouth every 6 (six) hours as needed. Patient taking differently: Take 600 mg by mouth every 6 (six) hours as needed for mild pain.  10/20/18   Morris, Aundra Millet, DO  oxyCODONE-acetaminophen (PERCOCET) 5-325 MG tablet Take 1-2 tablets by mouth every 4 (four) hours as needed for severe pain. 10/20/18   Morris, Aundra Millet, DO  simethicone (MYLICON) 80 MG chewable tablet Chew 80 mg by mouth every 6 (six) hours as needed for flatulence.    [provider]  sulfamethoxazole-trimethoprim (BACTRIM DS,SEPTRA DS) 800-160 MG tablet Take 1 tablet by mouth 2 (two) times daily for 7 days. 12/28/18 01/04/19  Minnie Legros, Chase Picket, PA-C    Family History Family History  Problem Relation Age of Onset  . Depression Mother   . Migraines Mother   . Migraines Father     Social History Social History   Tobacco Use  . Smoking status: Never Smoker  . Smokeless tobacco: Never Used  Substance Use Topics  . Alcohol use: Yes    Comment: socially  . Drug use: Yes    Types: Marijuana    Comment: Last use Thursday 10/16/18     Allergies   Patient has no  known allergies.   Review of Systems Review of Systems  Constitutional: Negative for fever.  HENT: Negative for congestion.   Respiratory: Negative for cough.   Musculoskeletal: Positive for myalgias.  Skin: Positive for wound.  Neurological: Negative for weakness.     Physical Exam Updated Vital Signs BP 127/73 (BP Location: Right Arm)   Pulse 84   Temp 99.5 F (37.5 C) (Oral)   Resp 16   Ht 5\' 3"  (1.6 m)   Wt 55.3 kg   LMP 12/06/2018   BMI 21.61 kg/m   Physical Exam Vitals signs and nursing note reviewed.  Constitutional:      General: She is not in acute distress.    Appearance: She is well-developed.  HENT:     Head: Normocephalic and atraumatic.  Neck:     Musculoskeletal: Neck supple.  Cardiovascular:     Rate and Rhythm: Normal rate and regular rhythm.     Heart sounds: Normal heart sounds. No murmur.  Pulmonary:     Effort: Pulmonary effort is normal. No respiratory distress.     Breath sounds: Normal breath sounds. No wheezing or rales.  Musculoskeletal:        General: No swelling.  Skin:    General: Skin is warm and dry.     Comments: 2x3 area of fluctuance to the left axilla which is tender to palpation.  No streaking, but does have mild overlying erythema.  Neurological:     Mental Status: She is alert.     Comments: Upper extremities are neurovascularly intact.      ED Treatments / Results  Labs (all labs ordered are listed, but only abnormal results are displayed) Labs Reviewed - No data to display  EKG None  Radiology No results found.  Procedures .Marland KitchenIncision and Drainage Date/Time: 12/28/2018 10:39 PM Performed by: Auriana Scalia, Chase Picket, PA-C Authorized by: Caitlyn Buchanan, Chase Picket, PA-C   Consent:    Consent obtained:  Verbal   Consent given by:  Patient   Risks discussed:  Bleeding, incomplete drainage, pain and infection Location:    Type:  Abscess   Location:  Upper extremity   Upper extremity location: Left  axilla. Pre-procedure details:    Skin preparation:  Betadine Anesthesia (see MAR for exact dosages):    Anesthesia method:  Local infiltration   Local anesthetic:  Lidocaine 2% WITH epi Procedure type:    Complexity:  Complex Procedure details:    Incision types:  Single straight   Scalpel blade:  11   Wound management:  Probed and deloculated and irrigated with saline   Drainage amount:  Scant   Wound treatment:  Wound left open Post-procedure details:    Patient tolerance of procedure:  Tolerated well, no immediate complications   (including critical care time)  Medications Ordered in ED Medications  lidocaine-EPINEPHrine (XYLOCAINE W/EPI) 2 %-1:200000 (PF) injection 10 mL (10 mLs Infiltration Given 12/28/18 2147)  bacitracin ointment ( Topical Given 12/28/18 2234)     Initial Impression / Assessment and Plan / ED Course  I have reviewed the triage vital signs and the nursing notes.  Pertinent labs & imaging results that were available during my care of the patient were reviewed by me and considered in my medical decision making (see chart for details).     Joycelyn ManDajanique Huneycutt is a 23 y.o. female who presents to ED for abscess to left axilla.  I&D performed per procedure note above. Minimal drainage - not as much as I was expected. Will treat with bactrim. Strongly encouraged that she return in 2 days for wound check -- sooner if symptoms worsen or she develops a fever. Strict return precautions / follow up discussed at length. All questions answered.    Final Clinical Impressions(s) / ED Diagnoses   Final diagnoses:  Abscess    ED Discharge Orders         Ordered    sulfamethoxazole-trimethoprim (BACTRIM DS,SEPTRA DS) 800-160 MG tablet  2 times daily     12/28/18 2227    HYDROcodone-acetaminophen (NORCO) 5-325 MG tablet  Every 6 hours PRN     12/28/18 2227           Sonji Starkes, Chase PicketJaime Pilcher, PA-C 12/28/18 2241    Gerhard MunchLockwood, Robert, MD 12/28/18 2356

## 2018-12-28 NOTE — Discharge Instructions (Signed)
Please take all of your antibiotics until finished!    Ibuprofen as needed for mild to moderate pain. Norco only as needed for severe pain - This can make you very drowsy - please do not drink alcohol, operate heavy machinery or drive on this medication.   Follow up with your doctor, an urgent care, or return to ED for wound check in 2 days. Return to the emergency department if you develop a fever, your abscess appears to become infected (growing surrounding redness and warmth), new or worsening symptoms develop, any additional concerns.   Abscess An abscess (boil or furuncle) is an infected area that contains a collection of pus.   SYMPTOMS Signs and symptoms of an abscess include pain, tenderness, redness, or hardness. You may feel a moveable soft area under your skin. An abscess can occur anywhere in the body.   TREATMENT  A surgical cut (incision) may be made over your abscess to drain the pus. Gauze may be packed into the space or a drain may be looped through the abscess cavity (pocket). This provides a drain that will allow the cavity to heal from the inside outwards. The abscess may be painful for a few days, but should feel much better if it was drained.  Your abscess, if seen early, may not have localized and may not have been drained. If not, another appointment may be required if it does not get better on its own or with medications.  HOME CARE INSTRUCTIONS  Keep the skin and clothes clean around your abscess.  If the abscess was drained, you will need to use gauze dressing to collect any draining pus. Dressings will typically need to be changed 3 or more times a day.  The infection may spread by skin contact with others. Avoid skin contact as much as possible.  Practice good hygiene. This includes regular hand washing, cover any draining skin lesions, and do not share personal care items.  SEEK MEDICAL CARE IF:  You develop increased pain, swelling, redness, drainage, or bleeding  in the wound site.  You develop signs of generalized infection including muscle aches, chills, fever, or a general ill feeling.  You have an oral temperature above 102 F (38.9 C).

## 2019-01-26 ENCOUNTER — Encounter: Payer: Self-pay | Admitting: Internal Medicine

## 2019-01-26 ENCOUNTER — Ambulatory Visit (INDEPENDENT_AMBULATORY_CARE_PROVIDER_SITE_OTHER): Payer: No Typology Code available for payment source | Admitting: Internal Medicine

## 2019-01-26 VITALS — BP 120/80 | HR 110 | Temp 98.3°F | Ht 63.0 in | Wt 124.8 lb

## 2019-01-26 DIAGNOSIS — R112 Nausea with vomiting, unspecified: Secondary | ICD-10-CM | POA: Diagnosis not present

## 2019-01-26 DIAGNOSIS — R197 Diarrhea, unspecified: Secondary | ICD-10-CM

## 2019-01-26 LAB — CBC
HCT: 39 % (ref 36.0–46.0)
Hemoglobin: 13.1 g/dL (ref 12.0–15.0)
MCHC: 33.7 g/dL (ref 30.0–36.0)
MCV: 87 fl (ref 78.0–100.0)
Platelets: 252 10*3/uL (ref 150.0–400.0)
RBC: 4.49 Mil/uL (ref 3.87–5.11)
RDW: 14 % (ref 11.5–15.5)
WBC: 5.4 10*3/uL (ref 4.0–10.5)

## 2019-01-26 LAB — COMPREHENSIVE METABOLIC PANEL
ALT: 10 U/L (ref 0–35)
AST: 17 U/L (ref 0–37)
Albumin: 4.2 g/dL (ref 3.5–5.2)
Alkaline Phosphatase: 35 U/L — ABNORMAL LOW (ref 39–117)
BUN: 11 mg/dL (ref 6–23)
CO2: 27 mEq/L (ref 19–32)
Calcium: 9.3 mg/dL (ref 8.4–10.5)
Chloride: 105 mEq/L (ref 96–112)
Creatinine, Ser: 0.88 mg/dL (ref 0.40–1.20)
GFR: 97.07 mL/min (ref >60.00)
Glucose, Bld: 96 mg/dL (ref 70–99)
POTASSIUM: 3.6 meq/L (ref 3.5–5.1)
Sodium: 139 mEq/L (ref 135–145)
TOTAL PROTEIN: 7.2 g/dL (ref 6.0–8.3)
Total Bilirubin: 0.4 mg/dL (ref 0.2–1.2)

## 2019-01-26 LAB — TSH: TSH: 0.78 u[IU]/mL (ref 0.35–4.50)

## 2019-01-26 MED ORDER — ONDANSETRON HCL 4 MG PO TABS: 4.0000 mg | ORAL_TABLET | Freq: Three times a day (TID) | ORAL | 0 refills | Status: DC | PRN

## 2019-01-26 NOTE — Progress Notes (Signed)
Subjective:    Patient ID: Stephanie [name], female    DOB: [birth year], 23 y.o.   MRN: [id number]  HPI  Pt presents to the clinic today with nausea, vomiting or diarrhea. She reports this started on Saturday. She started vomiting on Sunday, and she threw up all day. She reports she vomited x 1 today. She has associated flushing and lightheadedness. She denies fever, chills or body aches but has felt weak. She has taken Zofran with some relief. She denies changes in diet or medications. She denies recent travel. No one around her has had similar symptoms.  Review of Systems  Past Medical History:  Diagnosis Date  . Anxiety 01/28/2013   no meds  . Depression    no meds  . IBS (irritable bowel syndrome)   . Migraines    HA every other day - otc med prn  . Recurrent streptococcal tonsillitis   . Seasonal allergies    Seasonal environmental allergies  . Vision abnormalities    Myopia; patient has history of devloping ulcers when she wore contacts.    Current Outpatient Medications  Medication Sig Dispense Refill  . etonogestrel (NEXPLANON) 68 MG IMPL implant 1 each by Subdermal route once.    Marland Kitchen ibuprofen (ADVIL,MOTRIN) 600 MG tablet Take 1 tablet (600 mg total) by mouth every 6 (six) hours as needed. (Patient taking differently: Take 600 mg by mouth every 6 (six) hours as needed for mild pain. ) 30 tablet 0  . ondansetron (ZOFRAN) 4 MG tablet Take 1 tablet (4 mg total) by mouth every 8 (eight) hours as needed. 20 tablet 0   No current facility-administered medications for this visit.     No Known Allergies  Family History  Problem Relation Age of Onset  . Depression Mother   . Migraines Mother   . Migraines Father     Social History   Socioeconomic History  . Marital status: Single    Spouse name: Not on file  . Number of children: Not on file  . Years of education: Not on file  . Highest education level: Not on file  Occupational History  . Not on file  Social  Needs  . Financial resource strain: Not on file  . Food insecurity:    Worry: Not on file    Inability: Not on file  . Transportation needs:    Medical: Not on file    Non-medical: Not on file  Tobacco Use  . Smoking status: Never Smoker  . Smokeless tobacco: Never Used  Substance and Sexual Activity  . Alcohol use: Yes    Comment: socially  . Drug use: Yes    Types: Marijuana    Comment: Last use Thursday 10/16/18  . Sexual activity: Not Currently    Partners: Male    Birth control/protection: Implant    Comment: broke up with first sexual partner 1 month ago  Lifestyle  . Physical activity:    Days per week: Not on file    Minutes per session: Not on file  . Stress: Not on file  Relationships  . Social connections:    Talks on phone: Not on file    Gets together: Not on file    Attends religious service: Not on file    Active member of club or organization: Not on file    Attends meetings of clubs or organizations: Not on file    Relationship status: Not on file  . Intimate partner violence:    Fear  of current or ex partner: Not on file    Emotionally abused: Not on file    Physically abused: Not on file    Forced sexual activity: Not on file  Other Topics Concern  . Not on file  Social History Narrative   Single.    Student at [employer].   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    Enjoys listening to music, hanging out with friends.      Constitutional: Pt reports fatigue. Denies fever, malaise, fatigue, headache or abrupt weight changes.  HEENT: Denies eye pain, eye redness, ear pain, ringing in the ears, wax buildup, runny nose, nasal congestion, bloody nose, or sore throat. Respiratory: Denies difficulty breathing, shortness of breath, cough or sputum production.   Cardiovascular: Denies chest pain, chest tightness, palpitations or swelling in the hands or feet.  Gastrointestinal: Pt reports nausea, vomiting and diarrhea. Denies abdominal pain,  bloating, constipation, or blood in the stool.  GU: Denies urgency, frequency, pain with urination, burning sensation, blood in urine, odor or discharge.  No other specific complaints in a complete review of systems (except as listed in HPI above).     Objective:   Physical Exam   BP 120/80   Pulse (!) 110   Temp 98.3 F (36.8 C)   Ht  (1.6 m)   Wt 124 lb 12.8 oz (56.6 kg)   LMP 01/23/2019 (Exact Date)   SpO2 98%   BMI 22.11 kg/m  Wt Readings from Last 3 Encounters:  01/26/19 124 lb 12.8 oz (56.6 kg)  12/28/18 122 lb (55.3 kg)  10/22/18 125 lb (56.7 kg)    General: Appears her stated age, well developed, well nourished in NAD. Skin: Warm, clammy but intact. No rashes noted. Neck:  No adenopathy noted.  Cardiovascular: Tachycardic with normal rhythm. S1,S2 noted.  No murmur, rubs or gallops noted. No JVD or BLE edema. No carotid bruits noted. Pulmonary/Chest: Normal effort and positive vesicular breath sounds. No respiratory distress. No wheezes, rales or ronchi noted.  Abdomen: Soft and nontender. Hyperactive bowel sounds. No distention or masses noted.  Neurological: Alert and oriented.    BMET    Component Value Date/Time   NA 137 10/22/2018 1026   K 3.6 10/22/2018 1026   CL 105 10/22/2018 1026   CO2 23 10/22/2018 1026   GLUCOSE 78 10/22/2018 1026   BUN <5 (L) 10/22/2018 1026   CREATININE 0.83 10/22/2018 1026   CALCIUM 9.1 10/22/2018 1026   GFRNONAA >60 10/22/2018 1026   GFRAA >60 10/22/2018 1026    Lipid Panel     Component Value Date/Time   CHOL 132 11/20/2017 1054   TRIG 48.0 11/20/2017 1054   HDL 39.50 11/20/2017 1054   CHOLHDL 3 11/20/2017 1054   VLDL 9.6 11/20/2017 1054   LDLCALC 83 11/20/2017 1054    CBC    Component Value Date/Time   WBC 5.6 10/22/2018 1026   RBC 4.63 10/22/2018 1026   HGB 12.9 10/22/2018 1026   HCT 40.7 10/22/2018 1026   PLT 261 10/22/2018 1026   MCV 87.9 10/22/2018 1026   MCH 27.9 10/22/2018 1026   MCHC 31.7  10/22/2018 1026   RDW 12.9 10/22/2018 1026   LYMPHSABS 2.0 10/22/2018 1026   MONOABS 0.3 10/22/2018 1026   EOSABS 0.0 10/22/2018 1026   BASOSABS 0.0 10/22/2018 1026    Hgb A1C No results found for: HGBA1C         Assessment & Plan:   Nausea, Vomiting and Diarrhea:  Likely viral Encouraged her to get plenty of rest and drink plenty of fluids CBC, CMET and TSH today RX for Zofran 4 mg PO Q8H prn Discussed the importance of good handwashing  Return precautions discussed Nicki Reaper, NP

## 2019-01-26 NOTE — Progress Notes (Signed)
Pre visit review using our clinic review tool, if applicable. No additional management support is needed unless otherwise documented below in the visit note. 

## 2019-01-26 NOTE — Patient Instructions (Signed)
Viral Gastroenteritis, Adult    Viral gastroenteritis is also known as the stomach flu. This condition is caused by certain germs (viruses). These germs can be passed from person to person very easily (are very contagious). This condition can cause sudden watery poop (diarrhea), fever, and throwing up (vomiting).  Having watery poop and throwing up can make you feel weak and cause you to get dehydrated. Dehydration can make you tired and thirsty, make you have a dry mouth, and make it so you pee (urinate) less often. Older adults and people with other diseases or a weak defense system (immune system) are at higher risk for dehydration. It is important to replace the fluids that you lose from having watery poop and throwing up.  Follow these instructions at home:  Follow instructions from your doctor about how to care for yourself at home.  Eating and drinking  Follow these instructions as told by your doctor:   Take an oral rehydration solution (ORS). This is a drink that is sold at pharmacies and stores.   Drink clear fluids in small amounts as you are able, such as:  ? Water.  ? Ice chips.  ? Diluted fruit juice.  ? Low-calorie sports drinks.   Eat bland, easy-to-digest foods in small amounts as you are able, such as:  ? Bananas.  ? Applesauce.  ? Rice.  ? Low-fat (lean) meats.  ? Toast.  ? Crackers.   Avoid fluids that have a lot of sugar or caffeine in them.   Avoid alcohol.   Avoid spicy or fatty foods.  General instructions     Drink enough fluid to keep your pee (urine) clear or pale yellow.   Wash your hands often. If you cannot use soap and water, use hand sanitizer.   Make sure that all people in your home wash their hands well and often.   Rest at home while you get better.   Take over-the-counter and prescription medicines only as told by your doctor.   Watch your condition for any changes.   Take a warm bath to help with any burning or pain from having watery poop.   Keep all follow-up  visits as told by your doctor. This is important.  Contact a doctor if:   You cannot keep fluids down.   Your symptoms get worse.   You have new symptoms.   You feel light-headed or dizzy.   You have muscle cramps.  Get help right away if:   You have chest pain.   You feel very weak or you pass out (faint).   You see blood in your throw-up.   Your throw-up looks like coffee grounds.   You have bloody or black poop (stools) or poop that look like tar.   You have a very bad headache, a stiff neck, or both.   You have a rash.   You have very bad pain, cramping, or bloating in your belly (abdomen).   You have trouble breathing.   You are breathing very quickly.   Your heart is beating very quickly.   Your skin feels cold and clammy.   You feel confused.   You have pain when you pee.   You have signs of dehydration, such as:  ? Dark pee, hardly any pee, or no pee.  ? Cracked lips.  ? Dry mouth.  ? Sunken eyes.  ? Sleepiness.  ? Weakness.  This information is not intended to replace advice given to you by your   health care provider. Make sure you discuss any questions you have with your health care provider.  Document Released: 04/23/2008 Document Revised: 07/30/2018 Document Reviewed: 07/12/2015  Elsevier Interactive Patient Education  2019 [employer]

## 2019-02-04 ENCOUNTER — Other Ambulatory Visit: Payer: Self-pay

## 2019-02-04 ENCOUNTER — Encounter: Payer: Self-pay | Admitting: Primary Care

## 2019-02-04 ENCOUNTER — Ambulatory Visit (INDEPENDENT_AMBULATORY_CARE_PROVIDER_SITE_OTHER): Payer: No Typology Code available for payment source | Admitting: Primary Care

## 2019-02-04 VITALS — BP 116/76 | HR 101 | Temp 98.0°F | Ht 63.0 in | Wt 124.5 lb

## 2019-02-04 DIAGNOSIS — L0291 Cutaneous abscess, unspecified: Secondary | ICD-10-CM | POA: Diagnosis not present

## 2019-02-04 DIAGNOSIS — L02419 Cutaneous abscess of limb, unspecified: Secondary | ICD-10-CM | POA: Diagnosis not present

## 2019-02-04 DIAGNOSIS — L089 Local infection of the skin and subcutaneous tissue, unspecified: Secondary | ICD-10-CM | POA: Diagnosis not present

## 2019-02-04 MED ORDER — IBUPROFEN 800 MG PO TABS: 800.0000 mg | ORAL_TABLET | Freq: Three times a day (TID) | ORAL | 0 refills | Status: DC | PRN

## 2019-02-04 MED ORDER — SULFAMETHOXAZOLE-TRIMETHOPRIM 800-160 MG PO TABS: 1.0000 | ORAL_TABLET | Freq: Two times a day (BID) | ORAL | 0 refills | Status: DC

## 2019-02-04 MED FILL — SULFAMETHOXAZOLE-TMP DS TAB: 800-160 | 10 days supply | Qty: 20 | Fill #0

## 2019-02-04 NOTE — Assessment & Plan Note (Signed)
Third episode within 6 months. Referral placed to dermatology for further evaluation.

## 2019-02-04 NOTE — Assessment & Plan Note (Signed)
Located to right axilla x 1 week. Exam today consistent for abscess that is not appropriate for I&D. Rx for Bactrim DS tablets and Ibuprofen 800 mg tablets sent to pharmacy. Given recurrent infection we will send her to dermatology for further evaluation.  Follow up next week for evaluation.

## 2019-02-04 NOTE — Patient Instructions (Signed)
Start Bactrim DS (sulfamethoxazole/trimethoprim) tablets for infection. Take 1 tablet by mouth twice daily for 10 days.  You will be contacted regarding your referral to dermatology.  Please let us know if you have not been contacted within one week.   It was a pleasure to see you today!

## 2019-02-04 NOTE — Progress Notes (Signed)
Subjective:    Patient ID: Stephanie [name], female    DOB: 09/21/1996, 23 y.o.   MRN: [id number]  HPI  Stephanie [name] is a 23 year old female who presents today with a chief complaint of skin mass.   She was evaluated on 12/28/18 at [facility] with a one week history of swelling, pain, redness to the left axilla. During her visit she underwent I&D with little drainage. She was placed on oral Bactrim DS tablets and encouraged to return two days later for wound check.   Since her ED visit she's noticed improvement to abscess to left axilla. She's stopped shaving her axilla and stopped using deodorant. About one week ago she noticed a mass to her right axilla that is painful. She's been applying warm compresses without much improvement. She denies fevers. She's not taken anything OTC.   Review of Systems  Constitutional: Negative for fever.  Respiratory: Negative for shortness of breath.   Skin: Positive for color change.       abscess       Past Medical History:  Diagnosis Date  . Anxiety 01/28/2013   no meds  . Depression    no meds  . IBS (irritable bowel syndrome)   . Migraines    HA every other day - otc med prn  . Recurrent streptococcal tonsillitis   . Seasonal allergies    Seasonal environmental allergies  . Vision abnormalities    Myopia; patient has history of devloping ulcers when she wore contacts.     Social History   Socioeconomic History  . Marital status: Single    Spouse name: Not on file  . Number of children: Not on file  . Years of education: Not on file  . Highest education level: Not on file  Occupational History  . Not on file  Social Needs  . Financial resource strain: Not on file  . Food insecurity:    Worry: Not on file    Inability: Not on file  . Transportation needs:    Medical: Not on file    Non-medical: Not on file  Tobacco Use  . Smoking status: Never Smoker  . Smokeless tobacco: Never Used  Substance and Sexual Activity  . Alcohol  use: Yes    Comment: socially  . Drug use: Yes    Types: Marijuana    Comment: Last use Thursday 10/16/18  . Sexual activity: Not Currently    Partners: Male    Birth control/protection: Implant    Comment: broke up with first sexual partner 1 month ago  Lifestyle  . Physical activity:    Days per week: Not on file    Minutes per session: Not on file  . Stress: Not on file  Relationships  . Social connections:    Talks on phone: Not on file    Gets together: Not on file    Attends religious service: Not on file    Active member of club or organization: Not on file    Attends meetings of clubs or organizations: Not on file    Relationship status: Not on file  . Intimate partner violence:    Fear of current or ex partner: Not on file    Emotionally abused: Not on file    Physically abused: Not on file    Forced sexual activity: Not on file  Other Topics Concern  . Not on file  Social History Narrative   Single.    Student at [employer].  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    Enjoys listening to music, hanging out with friends.     Past Surgical History:  Procedure Laterality Date  . COLONOSCOPY     IBS  . COLONOSCOPY     IBS  . LAPAROSCOPY N/A 10/20/2018   Procedure: LAPAROSCOPY DIAGNOSTIC possible removal of endometriosis;  Surgeon: Mitchel Honour, DO;  Location: [facility];  Service: Gynecology;  Laterality: N/A;    Family History  Problem Relation Age of Onset  . Depression Mother   . Migraines Mother   . Migraines Father     No Known Allergies  Current Outpatient Medications on File Prior to Visit  Medication Sig Dispense Refill  . etonogestrel (NEXPLANON) 68 MG IMPL implant 1 each by Subdermal route once.    Marland Kitchen ibuprofen (ADVIL,MOTRIN) 600 MG tablet Take 1 tablet (600 mg total) by mouth every 6 (six) hours as needed. (Patient taking differently: Take 600 mg by mouth every 6 (six) hours as needed for mild pain. ) 30 tablet 0   No current  facility-administered medications on file prior to visit.     BP 116/76   Pulse (!) 101   Temp 98 F (36.7 C) (Oral)   Ht 5\' 3"  (1.6 m)   Wt 124 lb 8 oz (56.5 kg)   LMP 01/23/2019 (Exact Date)   SpO2 99%   BMI 22.05 kg/m    Objective:   Physical Exam  Constitutional: She appears well-nourished.  Respiratory: Effort normal.  Skin: Skin is warm and dry.  3 cm X 0.5 cm abscess to right axilla, tender, firm (non fluctuant).           Assessment & Plan:

## 2019-02-09 ENCOUNTER — Encounter: Payer: Self-pay | Admitting: Primary Care

## 2019-02-09 ENCOUNTER — Ambulatory Visit (INDEPENDENT_AMBULATORY_CARE_PROVIDER_SITE_OTHER): Payer: No Typology Code available for payment source | Admitting: Primary Care

## 2019-02-09 ENCOUNTER — Other Ambulatory Visit: Payer: Self-pay

## 2019-02-09 DIAGNOSIS — L0291 Cutaneous abscess, unspecified: Secondary | ICD-10-CM | POA: Diagnosis not present

## 2019-02-09 DIAGNOSIS — L089 Local infection of the skin and subcutaneous tissue, unspecified: Secondary | ICD-10-CM

## 2019-02-09 NOTE — Assessment & Plan Note (Signed)
Improved on bactrim DS tablets. Will have her continue antibiotics until complete. We will also work on getting her into dermatology within the next 2 weeks. Return precautions provided.

## 2019-02-09 NOTE — Progress Notes (Signed)
Subjective:    Patient ID: Stephanie [name], female    DOB: 1996-06-06, 23 y.o.   MRN: [id number]  HPI  Stephanie [name] is a 23 year old female who presents today for follow up of abscess.  She was last evaluated on 02/04/19 for complaints of bilateral axillary abscesses, right worse than left. Her exam was consistent for abscess but right axillary mass was not conducive to lancing. She was treated with a 10 day course of Bactrim DS tablets and told to return today for follow up.  Since her last visit she's noticed improvement in pain and redness. The swelling is about the same. She denies fevers, drainage. She's compliant to her bactrim DS tablets which is causing some GI upset but overall tolerable. She has not yet heard back from her dermatology referral.  Review of Systems  Constitutional: Negative for fever.  Skin: Negative for color change.       Past Medical History:  Diagnosis Date  . Anxiety 01/28/2013   no meds  . Depression    no meds  . IBS (irritable bowel syndrome)   . Migraines    HA every other day - otc med prn  . Recurrent streptococcal tonsillitis   . Seasonal allergies    Seasonal environmental allergies  . Vision abnormalities    Myopia; patient has history of devloping ulcers when she wore contacts.     Social History   Socioeconomic History  . Marital status: Single    Spouse name: Not on file  . Number of children: Not on file  . Years of education: Not on file  . Highest education level: Not on file  Occupational History  . Not on file  Social Needs  . Financial resource strain: Not on file  . Food insecurity:    Worry: Not on file    Inability: Not on file  . Transportation needs:    Medical: Not on file    Non-medical: Not on file  Tobacco Use  . Smoking status: Never Smoker  . Smokeless tobacco: Never Used  Substance and Sexual Activity  . Alcohol use: Yes    Comment: socially  . Drug use: Yes    Types: Marijuana    Comment:  Last use Thursday 10/16/18  . Sexual activity: Not Currently    Partners: Male    Birth control/protection: Implant    Comment: broke up with first sexual partner 1 month ago  Lifestyle  . Physical activity:    Days per week: Not on file    Minutes per session: Not on file  . Stress: Not on file  Relationships  . Social connections:    Talks on phone: Not on file    Gets together: Not on file    Attends religious service: Not on file    Active member of club or organization: Not on file    Attends meetings of clubs or organizations: Not on file    Relationship status: Not on file  . Intimate partner violence:    Fear of current or ex partner: Not on file    Emotionally abused: Not on file    Physically abused: Not on file    Forced sexual activity: Not on file  Other Topics Concern  . Not on file  Social History Narrative   Single.    Student at [employer].   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    Enjoys listening to music, hanging out with friends.  Past Surgical History:  Procedure Laterality Date  . COLONOSCOPY     IBS  . COLONOSCOPY     IBS  . LAPAROSCOPY N/A 10/20/2018   Procedure: LAPAROSCOPY DIAGNOSTIC possible removal of endometriosis;  Surgeon: Mitchel Honour, DO;  Location: [facility];  Service: Gynecology;  Laterality: N/A;    Family History  Problem Relation Age of Onset  . Depression Mother   . Migraines Mother   . Migraines Father     No Known Allergies  Current Outpatient Medications on File Prior to Visit  Medication Sig Dispense Refill  . etonogestrel (NEXPLANON) 68 MG IMPL implant 1 each by Subdermal route once.    Marland Kitchen ibuprofen (ADVIL,MOTRIN) 800 MG tablet Take 1 tablet (800 mg total) by mouth every 8 (eight) hours as needed for moderate pain. (Patient not taking: Reported on 02/09/2019) 30 tablet 0  . sulfamethoxazole-trimethoprim (BACTRIM DS,SEPTRA DS) 800-160 MG tablet Take 1 tablet by mouth 2 (two) times daily. (Patient not taking:  Reported on 02/09/2019) 20 tablet 0   No current facility-administered medications on file prior to visit.     BP 100/70   Pulse 87   Temp 98.5 F (36.9 C) (Oral)   Ht 5' 2.5" (1.588 m)   Wt 124 lb (56.2 kg)   LMP 01/23/2019 (Exact Date)   SpO2 99%   BMI 22.32 kg/m    Objective:   Physical Exam  Constitutional: She appears well-nourished.  Cardiovascular: Normal rate.  Respiratory: Effort normal.  Skin: Skin is warm and dry. No erythema.  3.5 x 0.5 cm flesh colored mass to right axilla. Non tender with mild palpation. Improved from last visit. Reduced swelling.           Assessment & Plan:

## 2019-02-09 NOTE — Patient Instructions (Signed)
Continue Bactrim DS (sulfamethoxazole/trimethoprim) tablets for infection.   Stop by the front desk and speak with either Myriam Jacobson regarding your referral to dermatology.   It was a pleasure to see you today!

## 2019-02-09 NOTE — Assessment & Plan Note (Signed)
Improved from last visit, continue Bactrim DS tablets.

## 2019-02-13 MED FILL — IBUPROFEN 800 MG TAB: 800 | 10 days supply | Qty: 30 | Fill #0

## 2019-02-13 MED FILL — ONDANSETRON HCL 4 MG TABLET: 4 | 7 days supply | Qty: 20 | Fill #0

## 2019-02-20 MED FILL — CLINDAMYCIN PHOSP 1% LOTION: 1 | 21 days supply | Qty: 60 | Fill #0

## 2019-02-20 MED FILL — DOXYCYCLINE HYCLATE 100 MG: 100 | 30 days supply | Qty: 60 | Fill #0

## 2019-02-21 ENCOUNTER — Emergency Department (HOSPITAL_COMMUNITY)
Admission: EM | Admit: 2019-02-21 | Discharge: 2019-02-22 | Disposition: A | Discharge status: Home / Self Care | Payer: No Typology Code available for payment source | Attending: Emergency Medicine | Admitting: Emergency Medicine

## 2019-02-21 ENCOUNTER — Telehealth: Payer: Self-pay

## 2019-02-21 ENCOUNTER — Emergency Department (HOSPITAL_COMMUNITY): Payer: No Typology Code available for payment source

## 2019-02-21 ENCOUNTER — Other Ambulatory Visit: Payer: Self-pay

## 2019-02-21 ENCOUNTER — Encounter (HOSPITAL_COMMUNITY): Payer: Self-pay

## 2019-02-21 DIAGNOSIS — R509 Fever, unspecified: Secondary | ICD-10-CM | POA: Insufficient documentation

## 2019-02-21 DIAGNOSIS — Z79899 Other long term (current) drug therapy: Secondary | ICD-10-CM | POA: Diagnosis not present

## 2019-02-21 DIAGNOSIS — L02411 Cutaneous abscess of right axilla: Secondary | ICD-10-CM | POA: Insufficient documentation

## 2019-02-21 LAB — CBC WITH DIFFERENTIAL/PLATELET
Abs Immature Granulocytes: 0.02 10*3/uL (ref 0.00–0.07)
Basophils Absolute: 0 10*3/uL (ref 0.0–0.1)
Basophils Relative: 0 %
Eosinophils Absolute: 0 10*3/uL (ref 0.0–0.5)
Eosinophils Relative: 0 %
HCT: 39.3 % (ref 36.0–46.0)
Hemoglobin: 12.4 g/dL (ref 12.0–15.0)
Immature Granulocytes: 0 %
Lymphocytes Relative: 18 %
Lymphs Abs: 1.3 10*3/uL (ref 0.7–4.0)
MCH: 27.7 pg (ref 26.0–34.0)
MCHC: 31.6 g/dL (ref 30.0–36.0)
MCV: 87.9 fL (ref 80.0–100.0)
Monocytes Absolute: 0.4 10*3/uL (ref 0.1–1.0)
Monocytes Relative: 6 %
Neutro Abs: 5.6 10*3/uL (ref 1.7–7.7)
Neutrophils Relative %: 76 %
Platelets: 236 10*3/uL (ref 150–400)
RBC: 4.47 MIL/uL (ref 3.87–5.11)
RDW: 13.2 % (ref 11.5–15.5)
WBC: 7.4 10*3/uL (ref 4.0–10.5)
nRBC: 0 % (ref 0.0–0.2)

## 2019-02-21 LAB — BASIC METABOLIC PANEL
Anion gap: 8 (ref 5–15)
BUN: 10 mg/dL (ref 6–20)
CO2: 25 mmol/L (ref 22–32)
Calcium: 9.5 mg/dL (ref 8.9–10.3)
Chloride: 102 mmol/L (ref 98–111)
Creatinine, Ser: 0.84 mg/dL (ref 0.44–1.00)
GFR calc Af Amer: >60 mL/min (ref >60)
GFR calc non Af Amer: >60 mL/min (ref >60)
Glucose, Bld: 127 mg/dL — ABNORMAL HIGH (ref 70–99)
Potassium: 3.6 mmol/L (ref 3.5–5.1)
Sodium: 135 mmol/L (ref 135–145)

## 2019-02-21 MED ORDER — OXYCODONE-ACETAMINOPHEN 5-325 MG PO TABS
1.0000 | ORAL_TABLET | Freq: Once | ORAL | Status: AC
  Administered 2019-02-21: 1 via ORAL
  Filled 2019-02-21: qty 1

## 2019-02-21 MED ORDER — LIDOCAINE-EPINEPHRINE (PF) 2 %-1:200000 IJ SOLN
20.0000 mL | Freq: Once | INTRAMUSCULAR | Status: AC
  Administered 2019-02-22: 20 mL
  Filled 2019-02-21: qty 20

## 2019-02-21 MED ORDER — IOHEXOL 300 MG/ML  SOLN
75.0000 mL | Freq: Once | INTRAMUSCULAR | Status: AC | PRN
  Administered 2019-02-21: 75 mL via INTRAVENOUS

## 2019-02-21 MED ORDER — SODIUM CHLORIDE 0.9 % IV BOLUS
1000.0000 mL | Freq: Once | INTRAVENOUS | Status: AC
  Administered 2019-02-21: 1000 mL via INTRAVENOUS

## 2019-02-21 MED ORDER — IBUPROFEN 400 MG PO TABS
600.0000 mg | ORAL_TABLET | Freq: Once | ORAL | Status: AC
  Administered 2019-02-21: 600 mg via ORAL
  Filled 2019-02-21: qty 1

## 2019-02-21 NOTE — ED Notes (Signed)
No pain meds today.  Last took Hydrocodone yesterday

## 2019-02-21 NOTE — ED Provider Notes (Signed)
[facility]   CSN: [id number] Arrival date & time: 02/21/19  1959    History   Chief Complaint Chief Complaint  Patient presents with   Abscess    HPI Stephanie [name] is a 23 y.o. female with history of anxiety, IBS, migraines presenting to emergency department today with chief complaint of abscess in right axilla x 1 month.  Patient has been seen by her PCP and dermatology for this abscess.  She is currently taking doxycycline and using clindamycin ointment.  Patient is having worsening pain at the site of abscess.  Over the last 3 days pain has become severe and radiates now to her shoulder and breast.  States the pain is constant and describes it as an ache.  She rates it 9 out of 10 in severity.  Patient states the abscess has previously drained large amounts of yellow and green drainage with foul odor however it is not currently draining now.  Patient states fever with T-max of 101 yesterday.  She took hydrocodone yesterday with minimal relief.  She did not take anything for pain prior to arrival today.  Denies URI symptoms, urinary symptoms.  History provided by patient.  Past Medical History:  Diagnosis Date   Anxiety 01/28/2013   no meds   Depression    no meds   IBS (irritable bowel syndrome)    Migraines    HA every other day - otc med prn   Recurrent streptococcal tonsillitis    Seasonal allergies    Seasonal environmental allergies   Vision abnormalities    Myopia; patient has history of devloping ulcers when she wore contacts.    Patient Active Problem List   Diagnosis Date Noted   Recurrent infection of skin 02/04/2019   Abscess 08/04/2018   Sebaceous cyst 08/04/2018   Preventative health care 11/20/2017   Menorrhagia with regular cycle 09/24/2014   Migraines 08/04/2014   Abnormal vision screen 04/03/2013   Loss of weight 04/03/2013   Allergic rhinitis 04/03/2013   MDD (major  depressive disorder), single episode, severe (HCC) 01/28/2013    Class: Chronic   Generalized anxiety disorder 01/28/2013    Past Surgical History:  Procedure Laterality Date   COLONOSCOPY     IBS   COLONOSCOPY     IBS   LAPAROSCOPY N/A 10/20/2018   Procedure: LAPAROSCOPY DIAGNOSTIC possible removal of endometriosis;  Surgeon: Mitchel HonourMorris, Megan, DO;  Location: [facility];  Service: Gynecology;  Laterality: N/A;     OB History   No obstetric history on file.      Home Medications    Prior to Admission medications   Medication Sig Start Date End Date Taking? Authorizing Provider  clindamycin (CLEOCIN T) 1 % lotion Apply 1 application topically as needed (as directed- to affected areas).  02/20/19  Yes [provider]  doxycycline (VIBRA-TABS) 100 MG tablet Take 100 mg by mouth 2 (two) times daily. 02/20/19  Yes [provider]  etonogestrel (NEXPLANON) 68 MG IMPL implant 1 each by Subdermal route once.   Yes [provider]  ibuprofen (ADVIL,MOTRIN) 800 MG tablet Take 1 tablet (800 mg total) by mouth every 8 (eight) hours as needed for moderate pain. 02/04/19  Yes Doreene Nestlark, Katherine K, NP  ondansetron (ZOFRAN) 4 MG tablet Take 4 mg by mouth every 8 (eight) hours as needed for nausea or vomiting.  02/13/19  Yes [provider]  HYDROcodone-acetaminophen (NORCO/VICODIN) 5-325 MG tablet Take 1 tablet by mouth every 6 (  six) hours as needed for severe pain. 02/22/19   Dilcia Rybarczyk E, PA-C  sulfamethoxazole-trimethoprim (BACTRIM DS,SEPTRA DS) 800-160 MG tablet Take 1 tablet by mouth 2 (two) times daily. Patient not taking: Reported on 02/21/2019 02/04/19   Doreene Nest, NP    Family History Family History  Problem Relation Age of Onset   Depression Mother    Migraines Mother    Migraines Father     Social History Social History   Tobacco Use   Smoking status: Never Smoker   Smokeless tobacco: Never Used  Substance Use Topics   Alcohol use:  Yes    Comment: socially   Drug use: Yes    Types: Marijuana    Comment: Last use Thursday 10/16/18     Allergies   Coconut oil and Septra [sulfamethoxazole-trimethoprim]   Review of Systems Review of Systems  Constitutional: Positive for fever. Negative for chills.  HENT: Negative for congestion, ear discharge, ear pain, sinus pressure, sinus pain and sore throat.   Eyes: Negative for pain and redness.  Respiratory: Negative for cough and shortness of breath.   Cardiovascular: Negative for chest pain.  Gastrointestinal: Negative for abdominal pain, constipation, diarrhea, nausea and vomiting.  Genitourinary: Negative for dysuria and hematuria.  Musculoskeletal: Positive for arthralgias. Negative for back pain and neck pain.  Skin: Positive for wound.  Neurological: Negative for weakness, numbness and headaches.     Physical Exam Updated Vital Signs BP 129/83    Pulse (!) 137    Temp 100.2 F (37.9 C) (Oral)    Resp 18    LMP 01/23/2019 (Exact Date)    SpO2 100%   Physical Exam Vitals signs and nursing note reviewed.  Constitutional:      Appearance: She is well-developed. She is not toxic-appearing.  HENT:     Head: Normocephalic and atraumatic.  Eyes:     General: No scleral icterus.       Right eye: No discharge.        Left eye: No discharge.     Conjunctiva/sclera: Conjunctivae normal.  Neck:     Musculoskeletal: Normal range of motion.  Cardiovascular:     Rate and Rhythm: Regular rhythm. Tachycardia present.     Pulses: Normal pulses.     Heart sounds: Normal heart sounds.  Pulmonary:     Effort: Pulmonary effort is normal.     Breath sounds: Normal breath sounds.  Abdominal:     General: There is no distension.     Palpations: Abdomen is soft.  Musculoskeletal: Normal range of motion.     Comments: Upper extremities are neurovascularly intact.  Patient has decreased movement of right upper extremity secondary to pain.  Skin:    General: Skin is warm  and dry.     Comments: 6 cm x 0.5 cm abscess to right axilla, tender, firm, nonfluctuant.  No streaking, but does have mild overlying erythema. Tender to palpation  Neurological:     Mental Status: She is oriented to person, place, and time.     Comments: Fluent speech, no facial droop.  Psychiatric:        Behavior: Behavior normal.      ED Treatments / Results  Labs (all labs ordered are listed, but only abnormal results are displayed) Labs Reviewed  BASIC METABOLIC PANEL - Abnormal; Notable for the following components:      Result Value   Glucose, Bld 127 (*)    All other components within normal limits  CBC  WITH DIFFERENTIAL/PLATELET    EKG None  Radiology Ct Shoulder Right W Contrast  Result Date: 02/21/2019 CLINICAL DATA:  23 year old female with mass or lump in the right axilla. EXAM: CT OF THE UPPER RIGHT EXTREMITY WITH CONTRAST TECHNIQUE: Multidetector CT imaging of the upper right extremity was performed according to the standard protocol following intravenous contrast administration. COMPARISON:  Chest CT dated 10/22/2018 CONTRAST:  62mL OMNIPAQUE IOHEXOL 300 MG/ML  SOLN FINDINGS: Bones/Joint/Cartilage There is no acute fracture or dislocation. The bones are well mineralized. No arthritic changes. No joint effusion. Ligaments Suboptimally assessed by CT. Muscles and Tendons No acute findings. Soft tissues There is a 4.5 x 3.0 x 4.3 cm low attenuating lesion in the superficial soft tissues of the right axilla. This lesion has an enhancing thick peripheral tissue with haziness of the surrounding subcutaneous fat. This may represent a phlegmon or developing abscess or a necrotic mass or a conglomerate of necrotic enlarged lymph nodes. There is thickening of the overlying skin. This can be further evaluated with ultrasound to assess for possible drainage. IMPRESSION: Right axillary necrotic mass/conglomerate of abnormal lymph nodes versus a phlegmon or developing abscess.  Ultrasound may provide better evaluation and assessment for drainage or biopsy. Electronically Signed   By: Elgie Collard M.D.   On: 02/21/2019 23:09    Procedures .Marland KitchenIncision and Drainage Date/Time: 02/22/2019 12:17 AM Performed by: Sherene Sires, PA-C Authorized by: Sherene Sires, PA-C   Consent:    Consent obtained:  Verbal   Consent given by:  Patient   Risks discussed:  Bleeding, incomplete drainage and infection   Alternatives discussed:  No treatment Location:    Type:  Abscess   Size:  6 cm x .05 cm   Location:  Upper extremity   Upper extremity location:  Arm   Arm location:  R upper arm (right axilla) Pre-procedure details:    Skin preparation:  Betadine Anesthesia (see MAR for exact dosages):    Anesthesia method:  Local infiltration   Local anesthetic:  Lidocaine 2% WITH epi Procedure details:    Incision types:  Single straight   Scalpel blade:  11   Wound management:  Probed and deloculated and irrigated with saline   Drainage:  Purulent   Drainage amount:  Copious   Wound treatment:  Wound left open   Packing materials:  None Post-procedure details:    Patient tolerance of procedure:  Tolerated well, no immediate complications   (including critical care time)  Medications Ordered in ED Medications  sodium chloride 0.9 % bolus 1,000 mL (0 mLs Intravenous Stopped 02/22/19 0001)  oxyCODONE-acetaminophen (PERCOCET/ROXICET) 5-325 MG per tablet 1 tablet (1 tablet Oral Given 02/21/19 2055)  ibuprofen (ADVIL,MOTRIN) tablet 600 mg (600 mg Oral Given 02/21/19 2138)  iohexol (OMNIPAQUE) 300 MG/ML solution 75 mL (75 mLs Intravenous Contrast Given 02/21/19 2224)  lidocaine-EPINEPHrine (XYLOCAINE W/EPI) 2 %-1:200000 (PF) injection 20 mL (20 mLs Infiltration Given 02/22/19 0050)     Initial Impression / Assessment and Plan / ED Course  I have reviewed the triage vital signs and the nursing notes.  Pertinent labs & imaging results that were available during my care  of the patient were reviewed by me and considered in my medical decision making (see chart for details).  On arrival pt is febrile to 100.2 and tachycardic at 130. She has been evaluated for this abscess by pcp and dermatology. Pt took short course of Bactrim x1 month ago when she first noticed abscess and it improved.  Approximately a week later it returned and she was seen by dermatologist on 02/13/2019. Pt states at that visit she received a "shot of something" and was prescribed doxycycline and topical clindamycin ointment. Her pharmacy was closed due to coronavirus outbreak and she was unable to get her medications until recently. She has only taken the doxy x2 days, prescription written for 30 days. DDX includes abscess with worsening infection, cellulitis. Pt denies urinary or URI symptoms, so unlikely cause of fever. Work up today includes BMP and CBC both unremarkable, no leukocytosis. Will CT to look for worsening or spreading infection.  Pt states pain improved significantly after percocet. On reexamination her temperature is improved to 99.6 and HR is 110. Looking through EMR, pt has been tachycardiac at past visits. She states her normal is around 110.  CT shows low attenuating lesion in right axilla, suggesting abscess. I&D performed, see procedure note above. Pt tolerated well and there was significant copious drainage. Recommend pt continue taking doxycycline as prescribed. Strongly encouraged that she return in 2 days for wound check or sooner if she develops a fever or symptoms worsen. Strict return precautions and follow up discussed at length. Pt's vitals improved by discharge.  Pt case discussed with Dr. Charm Barges who agrees with my plan.    Vitals:   02/21/19 2011 02/21/19 2129 02/21/19 2132 02/22/19 0049  BP: 129/83 120/68  123/81  Pulse: (!) 137 (!) 111 (!) 118 98  Resp: Temp: 100.2 F (37.9 C)  (!) 101.4 F (38.6 C) 99 F (37.2 C)  TempSrc: Oral  Oral Oral  SpO2:  100% 100% 100% 100%    This note was prepared with assistance of Dragon voice recognition software. Occasional wrong-word or sound-a-like substitutions may have occurred due to the inherent limitations of voice recognition software.     Final Clinical Impressions(s) / ED Diagnoses   Final diagnoses:  Abscess of axilla, right    ED Discharge Orders         Ordered    HYDROcodone-acetaminophen (NORCO/VICODIN) 5-325 MG tablet  Every 6 hours PRN     02/22/19 0016           Sherene Sires, PA-C 02/22/19 1217    Terrilee Files, MD 02/22/19 1455

## 2019-02-21 NOTE — ED Triage Notes (Signed)
Onset 1 month ago abscess in right axilla.  Pt has been seen by dermatologist, antibiotic and topical ointment.  Painful when trying to lift right arm.  Onset 2-3 days ago pt stated pain woke her up, pt got in shoulder with warm compress.  Abscess drained large amount yellowish-greenish, foul smelling drainage.

## 2019-02-21 NOTE — Discharge Instructions (Addendum)
You have been seen today for right axilla abscess. Please read and follow all provided instructions. Return to the emergency room for worsening condition or new concerning symptoms.  Watch for signs of worsening infection to include fever, redness  1. Medications:  Continue doxycycline prescribed by your dermatologist for abscess. Continue usual home medications  2. Treatment: rest, drink plenty of fluids  3. Follow Up: Please follow up with your primary doctor in 2-5 days for discussion of your diagnoses and further evaluation after today's visit; Call today to arrange your follow up.    It is also a possibility that you have an allergic reaction to any of the medicines that you have been prescribed - Everybody reacts differently to medications and while MOST people have no trouble with most medicines, you may have a reaction such as nausea, vomiting, rash, swelling, shortness of breath. If this is the case, please stop taking the medicine immediately and contact your physician.  ?

## 2019-02-22 MED ORDER — HYDROCODONE-ACETAMINOPHEN 5-325 MG PO TABS: 1.0000 | ORAL_TABLET | Freq: Four times a day (QID) | ORAL | 0 refills | Status: DC | PRN

## 2019-02-22 NOTE — ED Notes (Signed)
E-Signature not available, verbalized understanding of DC instructions and prescriptions.

## 2019-02-23 NOTE — Telephone Encounter (Signed)
[facility] Night - Client TELEPHONE ADVICE RECORD [facility] Patient Name: [facility] Gender: Female DOB: 06-Dec-1995 Age: 23 Y 2 M 30 D Return Phone Number: [phone] (Primary) Address: [place] [address] Client [facility] Night - Client Client Site [facility] - Night Physician Vernona Rieger - NP Contact Type Call Who Is Calling Patient / Member / Family / Caregiver Call Type Triage / Clinical Relationship To Patient Self Return Phone Number [phone] (Primary) Chief Complaint Skin Lesion - Moles/ Lumps/ Growths Reason for Call Symptomatic / Request for Health Information Initial Comment Caller states that she has a lump under her right arm. Has purulent discharge and pain. Translation No Nurse Assessment Nurse: Deatra James, RN, Corrie Dandy Date/Time (Eastern Time): 02/21/2019 3:44:40 PM Confirm and document reason for call. If symptomatic, describe symptoms. ---Patient states she has a lump under her right arm ad it is painful and draining pus. She is taking Doxycycline and Cidamycin for this for 3 days and she was given a steroid shot in this area as well. The dermatologist said it looked like hyrodentis Has the patient had close contact with a person known or suspected to have the novel coronavirus illness OR traveled / lives in area with major community spread (including international travel) in the last 14 days from the onset of symptoms? * If Asymptomatic, screen for exposure and travel within the last 14 days. ---No Does the patient have any new or worsening symptoms? ---Yes Will a triage be completed? ---Yes Related visit to physician within the last 2 weeks? ---No Does the PT have any chronic conditions? (i.e. diabetes, asthma, this includes High risk factors for pregnancy, etc.) ---Yes List chronic conditions. ---"anemic, IBS" Is the patient pregnant or  possibly pregnant? (Ask all females between the ages of 22-55) ---No Is this a behavioral health or substance abuse call? ---No Guidelines Guideline Title Affirmed Question Affirmed Notes Nurse Date/Time Lamount Cohen Time) Cellulitis on Antibiotic Follow-up Call SEVERE pain Carvel Getting 02/21/2019 3:46:56 PM Disp. Time Lamount Cohen Time) Disposition Final User PLEASE NOTE: All timestamps contained within this report are represented as [place] Standard Time. CONFIDENTIALTY NOTICE: This fax transmission is intended only for the addressee. It contains information that is legally privileged, confidential or otherwise protected from use or disclosure. If you are not the intended recipient, you are strictly prohibited from reviewing, disclosing, copying using or disseminating any of this information or taking any action in reliance on or regarding this information. If you have received this fax in error, please notify us immediately by telephone so that we can arrange for its return to [place]. Phone: [phone], Toll-Free: [phone], Fax: [phone] Page: 2 of 2 Call Id: [id number] 02/21/2019 3:48:29 PM Go to ED Now Yes Deatra James, RN, Jenelle Mages Disagree/Comply Comply Caller Understands Yes PreDisposition Did not know what to do Care Advice Given Per Guideline GO TO ED NOW: * You need to be seen in the Emergency Department. PAIN MEDICINES: * For pain relief, take acetaminophen, ibuprofen, or naproxen. * Use the lowest amount that makes your pain feel better. ACETAMINOPHEN (E.G., TYLENOL): IBUPROFEN (E.G., MOTRIN, ADVIL): * Take 220 mg (one 220 mg pill) by mouth every 8 hours as needed. You may take 440 mg (two 220 mg pills) for your first dose. CARE ADVICE given per Cellulitis on Antibiotic Follow-Up Call (Adult) guideline. Referrals [facility] - ED

## 2019-02-23 NOTE — Telephone Encounter (Signed)
Patient should have follow up per ER for wound after reviewing notes further for wound check.   I am assuming this would be best served as a face to face with you here in the office rather than by webex?  Please advise how you would like scheduled.

## 2019-02-23 NOTE — Telephone Encounter (Signed)
Patient was seen in ER and treated for abscess of right axilla on 02/21/19.   FYI to K. Chestine Spore, NP

## 2019-02-23 NOTE — Telephone Encounter (Signed)
In office please. Thanks.

## 2019-02-23 NOTE — Telephone Encounter (Signed)
Noted, chart reviewed

## 2019-02-24 ENCOUNTER — Other Ambulatory Visit: Payer: Self-pay

## 2019-02-24 ENCOUNTER — Encounter: Payer: Self-pay | Admitting: Primary Care

## 2019-02-24 ENCOUNTER — Ambulatory Visit (INDEPENDENT_AMBULATORY_CARE_PROVIDER_SITE_OTHER): Payer: No Typology Code available for payment source | Admitting: Primary Care

## 2019-02-24 DIAGNOSIS — L0291 Cutaneous abscess, unspecified: Secondary | ICD-10-CM | POA: Diagnosis not present

## 2019-02-24 NOTE — Assessment & Plan Note (Addendum)
Recurrent to the right axilla, recent ED visit with I&D. Compliant to Doxycycline, discussed to continue.  Referral placed to general surgery for consultation due to recurrent infections.  Dressing applied today. She appears stable.  All ED notes, labs, imaging reviewed.

## 2019-02-24 NOTE — Telephone Encounter (Signed)
Noted will route to [facility] to contact patient and get set up for an office visit.

## 2019-02-24 NOTE — Progress Notes (Signed)
Subjective:    Patient ID: Stephanie [name], female    DOB: 11/05/1996, 23 y.o.   MRN: [id number]  HPI  Stephanie [name] is a 23 year old female who presents today for emergency department follow up.  [facility] with a chief complaint of right axillary abscess. She was seen in our office on 02/09/19 for the same and was treated with Bactrim DS tablets and referred to dermatology. She endorsed a three day history of increased pain and size of the abscess despite taking Doxycycline and using clindamycin ointment that was prescribed per dermatology. She did admit that she was delayed in getting her Doxycycline antibiotic and had only taken for two days total.   During her stay in the ED she was noted to be febrile at 100.2 with tachycardia at 130 bpm. She underwent CT which showed "low attenuating lesion to the right axilla suggesting abscess". She underwent I&D with expulsion of a moderate amount of copious drainage. She was encouraged to continue taking Doxycycline as prescribed and that she return two days later for wound check.  Since her ED visit she's doing much better. She felt immediate relief once the abscess was drained. She is compliant to her Doxycycline antibiotics twice daily as prescribed. She denies fevers, palpitations, increased size in abscess.   Review of Systems  Constitutional: Negative for fever.  Respiratory: Negative for shortness of breath.   Cardiovascular: Negative for chest pain and palpitations.  Skin: Negative for color change and wound.       Past Medical History:  Diagnosis Date   Anxiety 01/28/2013   no meds   Depression    no meds   IBS (irritable bowel syndrome)    Migraines    HA every other day - otc med prn   Recurrent streptococcal tonsillitis    Seasonal allergies    Seasonal environmental allergies   Vision abnormalities    Myopia; patient has history of devloping ulcers when she wore contacts.     Social  History   Socioeconomic History   Marital status: Single    Spouse name: Not on file   Number of children: Not on file   Years of education: Not on file   Highest education level: Not on file  Occupational History   Not on file  Social Needs   Financial resource strain: Not on file   Food insecurity:    Worry: Not on file    Inability: Not on file   Transportation needs:    Medical: Not on file    Non-medical: Not on file  Tobacco Use   Smoking status: Never Smoker   Smokeless tobacco: Never Used  Substance and Sexual Activity   Alcohol use: Yes    Comment: socially   Drug use: Yes    Types: Marijuana    Comment: Last use Thursday 10/16/18   Sexual activity: Not Currently    Partners: Male    Birth control/protection: Implant    Comment: broke up with first sexual partner 1 month ago  Lifestyle   Physical activity:    Days per week: Not on file    Minutes per session: Not on file   Stress: Not on file  Relationships   Social connections:    Talks on phone: Not on file    Gets together: Not on file    Attends religious service: Not on file    Active member of club or organization: Not on file  Attends meetings of clubs or organizations: Not on file    Relationship status: Not on file   Intimate partner violence:    Fear of current or ex partner: Not on file    Emotionally abused: Not on file    Physically abused: Not on file    Forced sexual activity: Not on file  Other Topics Concern   Not on file  Social History Narrative   Single.    Student at [employer]   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    Enjoys listening to music, hanging out with friends.     Past Surgical History:  Procedure Laterality Date   COLONOSCOPY     IBS   COLONOSCOPY     IBS   LAPAROSCOPY N/A 10/20/2018   Procedure: LAPAROSCOPY DIAGNOSTIC possible removal of endometriosis;  Surgeon: Mitchel Honour, DO;  Location: [facility];  Service: Gynecology;   Laterality: N/A;    Family History  Problem Relation Age of Onset   Depression Mother    Migraines Mother    Migraines Father     Allergies  Allergen Reactions   Coconut Oil Itching    Mouth itches, but no impaired breathing (per patient)   Septra [Sulfamethoxazole-Trimethoprim] Nausea Only and Other (See Comments)    Patient took a few courses of this and it ended up being stopped by a MD because it had started causing "bad stomach pain" (per the patient)    Current Outpatient Medications on File Prior to Visit  Medication Sig Dispense Refill   clindamycin (CLEOCIN T) 1 % lotion Apply 1 application topically as needed (as directed- to affected areas).      doxycycline (VIBRA-TABS) 100 MG tablet Take 100 mg by mouth 2 (two) times daily.     etonogestrel (NEXPLANON) 68 MG IMPL implant 1 each by Subdermal route once.     HYDROcodone-acetaminophen (NORCO/VICODIN) 5-325 MG tablet Take 1 tablet by mouth every 6 (six) hours as needed for severe pain. 6 tablet 0   ibuprofen (ADVIL,MOTRIN) 800 MG tablet Take 1 tablet (800 mg total) by mouth every 8 (eight) hours as needed for moderate pain. 30 tablet 0   ondansetron (ZOFRAN) 4 MG tablet Take 4 mg by mouth every 8 (eight) hours as needed for nausea or vomiting.      sulfamethoxazole-trimethoprim (BACTRIM DS,SEPTRA DS) 800-160 MG tablet Take 1 tablet by mouth 2 (two) times daily.     No current facility-administered medications on file prior to visit.     BP 120/76    Pulse 98    Temp 98.3 F (36.8 C) (Oral)    Ht 5' 2.5" (1.588 m)    Wt 125 lb 8 oz (56.9 kg)    LMP 01/23/2019    SpO2 99%    BMI 22.59 kg/m    Objective:   Physical Exam  Constitutional: She appears well-nourished.  Cardiovascular: Normal rate.  Skin: Skin is warm and dry. No erythema.  4 cm x 0.25 cm linear firm area to right axilla. Mild tenderness. No mass felt to left axilla. No erythema. No drainage.            Assessment & Plan:

## 2019-02-24 NOTE — Patient Instructions (Signed)
You will be contacted regarding your referral to general surgery.  Please let us know if you have not been contacted within one week.   Continue the doxycycline antibiotics as prescribed.  Please notify me if your symptoms return.  It was a pleasure to see you today!

## 2019-02-26 ENCOUNTER — Ambulatory Visit: Payer: Self-pay | Admitting: Surgery

## 2019-03-05 ENCOUNTER — Encounter: Payer: Self-pay | Admitting: Surgery

## 2019-03-05 ENCOUNTER — Ambulatory Visit (INDEPENDENT_AMBULATORY_CARE_PROVIDER_SITE_OTHER): Payer: No Typology Code available for payment source | Admitting: Surgery

## 2019-03-05 ENCOUNTER — Other Ambulatory Visit: Payer: Self-pay

## 2019-03-05 VITALS — BP 114/73 | HR 91 | Temp 97.9°F | Resp 15 | Ht 62.5 in | Wt 127.6 lb

## 2019-03-05 DIAGNOSIS — L732 Hidradenitis suppurativa: Secondary | ICD-10-CM | POA: Diagnosis not present

## 2019-03-05 HISTORY — DX: Hidradenitis suppurativa: L73.2

## 2019-03-05 NOTE — Patient Instructions (Signed)
Complete your antibiotics. If the area becomes red and painful please call.    Hidradenitis Suppurativa Hidradenitis suppurativa is a long-term (chronic) skin disease. It is similar to a severe form of acne, but it affects areas of the body where acne would be unusual, especially areas of the body where skin rubs against skin and becomes moist. These include:  Underarms.  Groin.  Genital area.  Buttocks.  Upper thighs.  Breasts. Hidradenitis suppurativa may start out as small lumps or pimples caused by blocked sweat glands or hair follicles. Pimples may develop into deep sores that break open (rupture) and drain pus. Over time, affected areas of skin may thicken and become scarred. This condition is rare and does not spread from person to person (non-contagious). What are the causes? The exact cause of this condition is not known. It may be related to:  Female and female hormones.  An overactive disease-fighting system (immune system). The immune system may over-react to blocked hair follicles or sweat glands and cause swelling and pus-filled sores. What increases the risk? You are more likely to develop this condition if you:  Are female.  Are 87-88 years old.  Have a family history of hidradenitis suppurativa.  Have a personal history of acne.  Are overweight.  Smoke.  Take the medicine lithium. What are the signs or symptoms? The first symptoms are usually painful bumps in the skin, similar to pimples. The condition may get worse over time (progress), or it may only cause mild symptoms. If the disease progresses, symptoms may include:  Skin bumps getting bigger and growing deeper into the skin.  Bumps rupturing and draining pus.  Itchy, infected skin.  Skin getting thicker and scarred.  Tunnels under the skin (fistulas) where pus drains from a bump.  Pain during daily activities, such as pain during walking if your groin area is affected.  Emotional problems,  such as stress or depression. This condition may affect your appearance and your ability or willingness to wear certain clothes or do certain activities. How is this diagnosed? This condition is diagnosed by a health care provider who specializes in skin diseases (dermatologist). You may be diagnosed based on:  Your symptoms and medical history.  A physical exam.  Testing a pus sample for infection.  Blood tests. How is this treated? Your treatment will depend on how severe your symptoms are. The same treatment will not work for everybody with this condition. You may need to try several treatments to find what works best for you. Treatment may include:  Cleaning and bandaging (dressing) your wounds as needed.  Lifestyle changes, such as new skin care routines.  Taking medicines, such as: ? Antibiotics. ? Acne medicines. ? Medicines to reduce the activity of the immune system. ? A diabetes medicine (metformin). ? Birth control pills, for women. ? Steroids to reduce swelling and pain.  Working with a mental health care provider, if you experience emotional distress due to this condition. If you have severe symptoms that do not get better with medicine, you may need surgery. Surgery may involve:  Using a laser to clear the skin and remove hair follicles.  Opening and draining deep sores.  Removing the areas of skin that are diseased and scarred. Follow these instructions at home: Medicines   Take over-the-counter and prescription medicines only as told by your health care provider.  If you were prescribed an antibiotic medicine, take it as told by your health care provider. Do not stop taking the  antibiotic even if your condition improves. Skin care  If you have open wounds, cover them with a clean dressing as told by your health care provider. Keep wounds clean by washing them gently with soap and water when you bathe.  Do not shave the areas where you get hidradenitis  suppurativa.  Do not wear deodorant.  Wear loose-fitting clothes.  Try to avoid getting overheated or sweaty. If you get sweaty or wet, change into clean, dry clothes as soon as you can.  To help relieve pain and itchiness, cover sore areas with a warm, clean washcloth (warm compress) for 5-10 minutes as often as needed.  If told by your health care provider, take a bleach bath twice a week: ? Fill your bathtub halfway with water. ? Pour in  cup of unscented household bleach. ? Soak in the tub for 5-10 minutes. ? Only soak from the neck down. Avoid water on your face and hair. ? Shower to rinse off the bleach from your skin. General instructions  Learn as much as you can about your disease so that you have an active role in your treatment. Work closely with your health care provider to find treatments that work for you.  If you are overweight, work with your health care provider to lose weight as recommended.  Do not use any products that contain nicotine or tobacco, such as cigarettes and e-cigarettes. If you need help quitting, ask your health care provider.  If you struggle with living with this condition, talk with your health care provider or work with a mental health care provider as recommended.  Keep all follow-up visits as told by your health care provider. This is important. Where to find more information  Hidradenitis Suppurativa Foundation, [employer].: https://www.hs-foundation.org/ Contact a health care provider if you have:  A flare-up of hidradenitis suppurativa.  A fever or chills.  Trouble controlling your symptoms at home.  Trouble doing your daily activities because of your symptoms.  Trouble dealing with emotional problems related to your condition. Summary  Hidradenitis suppurativa is a long-term (chronic) skin disease. It is similar to a severe form of acne, but it affects areas of the body where acne would be unusual.  The first symptoms are usually  painful bumps in the skin, similar to pimples. The condition may get worse over time (progress), or it may only cause mild symptoms.  If you have open wounds, cover them with a clean dressing as told by your health care provider. Keep wounds clean by washing them gently with soap and water when you bathe.  Besides skin care, treatment may include medicines, laser treatment, and surgery. This information is not intended to replace advice given to you by your health care provider. Make sure you discuss any questions you have with your health care provider. Document Released: 06/19/2004 Document Revised: 11/13/2017 Document Reviewed: 11/13/2017 [employer] Interactive Patient Education  2019 [employer]

## 2019-03-05 NOTE — Progress Notes (Signed)
Surgical Clinic History and Physical  Referring provider:  Doreene Nestlark, Katherine K, NP [address] [place], [place] [address]  HISTORY OF PRESENT ILLNESS (HPI):  23 y.o. female presents for evaluation of chronic recurrent bilateral axillary infections/abscesses. Patient reports she first developed B/L Right > Left axillary infection without appreciable swelling, drainage, or pain last October - November. This was treated at that time with Bactrim and seemed to have resolved. However, patient then experienced B/L Right > Left axillary swelling in February, for which she underwent incision and drainage without any purulent fluid obtained ("just blood"). This was again treated again with Bactrim, after which she was treated with Bactrim once more in early-mid March for infection with swelling, but without drainage or pain, though she experienced severe GI upset symptoms. She was then referred to dermatology for persistent B/L axillary swelling without pain, drainage, or erythema, for which she reportedly underwent axillary steroid injection, after which she says she experienced her worst pain, redness, and pus drainage, for which she underwent incision and drainage, this time treated with doxycycline since early April. She currently is continuing to take doxycycline and has been tolerating it well. Of note, she also previously underwent incision and drainage of a Left back cyst, which recurred, but not again since repeat drainage.  PAST MEDICAL HISTORY (PMH):  Past Medical History:  Diagnosis Date  . Anemia   . Anxiety 01/28/2013   no meds  . Depression    no meds  . IBS (irritable bowel syndrome)   . Migraines    HA every other day - otc med prn  . Recurrent streptococcal tonsillitis   . Seasonal allergies    Seasonal environmental allergies  . Vision abnormalities    Myopia; patient has history of devloping ulcers when she wore contacts.    PAST SURGICAL HISTORY (PSH):  Past Surgical  History:  Procedure Laterality Date  . COLONOSCOPY     IBS  . COLONOSCOPY     IBS  . LAPAROSCOPY N/A 10/20/2018   Procedure: LAPAROSCOPY DIAGNOSTIC possible removal of endometriosis;  Surgeon: Mitchel HonourMorris, Megan, DO;  Location: [facility];  Service: Gynecology;  Laterality: N/A;    MEDICATIONS:  Prior to Admission medications   Medication Sig Start Date End Date Taking? Authorizing Provider  clindamycin (CLEOCIN T) 1 % lotion Apply 1 application topically as needed (as directed- to affected areas).  02/20/19  Yes [provider]  doxycycline (VIBRA-TABS) 100 MG tablet Take 100 mg by mouth 2 (two) times daily. 02/20/19  Yes [provider]  etonogestrel (NEXPLANON) 68 MG IMPL implant 1 each by Subdermal route once.   Yes [provider]  ibuprofen (ADVIL,MOTRIN) 800 MG tablet Take 1 tablet (800 mg total) by mouth every 8 (eight) hours as needed for moderate pain. 02/04/19  Yes Doreene Nestlark, Katherine K, NP  ondansetron (ZOFRAN) 4 MG tablet Take 4 mg by mouth every 8 (eight) hours as needed for nausea or vomiting.  02/13/19  Yes [provider]     ALLERGIES:  Allergies  Allergen Reactions  . Coconut Oil Itching    Mouth itches, but no impaired breathing (per patient)  . Septra [Sulfamethoxazole-Trimethoprim] Nausea Only and Other (See Comments)    Patient took a few courses of this and it ended up being stopped by a MD because it had started causing "bad stomach pain" (per the patient)    SOCIAL HISTORY:  Social History   Socioeconomic History  . Marital status: Single    Spouse  name: Not on file  . Number of children: Not on file  . Years of education: Not on file  . Highest education level: Not on file  Occupational History  . Not on file  Social Needs  . Financial resource strain: Not on file  . Food insecurity:    Worry: Not on file    Inability: Not on file  . Transportation needs:    Medical: Not on file    Non-medical: Not on file  Tobacco Use  .  Smoking status: Never Smoker  . Smokeless tobacco: Never Used  Substance and Sexual Activity  . Alcohol use: Never    Frequency: Never    Comment: socially  . Drug use: Never    Comment: Last use Thursday 10/16/18  . Sexual activity: Not Currently    Partners: Male    Birth control/protection: Implant    Comment: broke up with first sexual partner 1 month ago  Lifestyle  . Physical activity:    Days per week: Not on file    Minutes per session: Not on file  . Stress: Not on file  Relationships  . Social connections:    Talks on phone: Not on file    Gets together: Not on file    Attends religious service: Not on file    Active member of club or organization: Not on file    Attends meetings of clubs or organizations: Not on file    Relationship status: Not on file  . Intimate partner violence:    Fear of current or ex partner: Not on file    Emotionally abused: Not on file    Physically abused: Not on file    Forced sexual activity: Not on file  Other Topics Concern  . Not on file  Social History Narrative   Single.    Student at [employer].   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    Enjoys listening to music, hanging out with friends.     The patient currently resides (home / rehab facility / nursing home): Home The patient normally is (ambulatory / bedbound): Ambulatory  FAMILY HISTORY:  Family History  Problem Relation Age of Onset  . Depression Mother   . Migraines Mother   . Migraines Father     Otherwise negative/non-contributory.  REVIEW OF SYSTEMS:  Constitutional: denies any other weight loss, fever, chills, or sweats  Eyes: denies any other vision changes, history of eye injury  ENT: denies sore throat, hearing problems  Respiratory: denies shortness of breath, wheezing  Cardiovascular: denies chest pain, palpitations  Gastrointestinal: currently denies abdominal pain, N/V, or diarrhea Musculoskeletal: denies any other joint pains or cramps   Skin: Denies any other rashes or skin discolorations except as per HPI Neurological: denies any other headache, dizziness, weakness  Psychiatric: Denies any other depression, anxiety   All other review of systems were otherwise negative   VITAL SIGNS:  BP 114/73   Pulse 91   Temp 97.9 F (36.6 C)   Resp 15   Ht 5' 2.5" (1.588 m)   Wt 127 lb 9.6 oz (57.9 kg)   LMP 02/23/2019 (Exact Date)   SpO2 98%   BMI 22.97 kg/m    PHYSICAL EXAM:  Constitutional:  -- Normal body habitus  -- Awake, alert, and oriented x3  Eyes:  -- Pupils equally round and reactive to light  -- No scleral icterus  Ear, nose, throat:  -- No jugular venous distension -- No nasal drainage, bleeding  Pulmonary:  -- No crackles  -- Equal breath sounds bilaterally -- Breathing non-labored at rest Cardiovascular:  -- S1, S2 present  -- No pericardial rubs  Gastrointestinal:  -- Abdomen soft, non-tender to palpation, non-distended, no guarding/rebound tenderness -- No abdominal masses appreciated, pulsatile or otherwise  Musculoskeletal and Integumentary:  -- Wounds or skin discoloration: B/L Right > Left currently minimally - non-tender to palpation axillary hidradenitis, currently without erythema or drainage -- Extremities: B/L UE and LE FROM, hands and feet warm, no edema  Neurologic:  -- Motor function: Intact and symmetric -- Sensation: Intact and symmetric  Labs:  CBC Latest Ref Rng & Units 02/21/2019 01/26/2019 10/22/2018  WBC 4.0 - 10.5 K/uL 7.4 5.4 5.6  Hemoglobin 12.0 - 15.0 g/dL 11.0 31.5 94.5  Hematocrit 36.0 - 46.0 % 39.3 39.0 40.7  Platelets 150 - 400 K/uL 236 252.0 261   CMP Latest Ref Rng & Units 02/21/2019 01/26/2019 10/22/2018  Glucose 70 - 99 mg/dL 859(Y) 96 78  BUN 6 - 20 mg/dL 10 11 <9(W)  Creatinine 0.44 - 1.00 mg/dL 4.46 2.86 3.81  Sodium 135 - 145 mmol/L 135 139 137  Potassium 3.5 - 5.1 mmol/L 3.6 3.6 3.6  Chloride 98 - 111 mmol/L 102 105 105  CO2 22 - 32 mmol/L 25 27 23   Calcium  8.9 - 10.3 mg/dL 9.5 9.3 9.1  Total Protein 6.0 - 8.3 g/dL - 7.2 6.7  Total Bilirubin 0.2 - 1.2 mg/dL - 0.4 0.7  Alkaline Phos 39 - 117 U/L - 35(L) 34(L)  AST 0 - 37 U/L - 17 20  ALT 0 - 35 U/L - 10 16   Imaging studies: No recent pertinent imaging studies available for review   Assessment/Plan:  23 y.o. female with currently non-infected B/L Right > Left recurrently infected axillary hidradenitis, complicated by co-morbidities including irritable bowel syndrome, frequent migraine headaches, generalized anxiety disorder, and major depression disorder.   - completed prescribed course of antibiotics  - maintain appropriate hygiene with soap and water  - avoid/minimize axillary deodorant or similar pore-occlusive creams  - office will call to schedule pre-operative appointment with Dr. Everlene Farrier or Dr. Lemar Livings prior to consideration for excision of axillary hidradenitis  - instructed to call office if pain, redness, questions, or concerns  All of the above recommendations were discussed with the patient, and all of patient's questions were answered to her expressed satisfaction.  Thank you for the opportunity to participate in this patient's care.  -- Scherrie Gerlach Earlene Plater, MD, RPVI : [facility] General Surgery - Partnering for exceptional care. Office: [phone]

## 2019-03-19 ENCOUNTER — Encounter: Payer: Self-pay | Admitting: Primary Care

## 2019-03-19 ENCOUNTER — Ambulatory Visit (INDEPENDENT_AMBULATORY_CARE_PROVIDER_SITE_OTHER): Payer: No Typology Code available for payment source | Admitting: Primary Care

## 2019-03-19 ENCOUNTER — Ambulatory Visit (INDEPENDENT_AMBULATORY_CARE_PROVIDER_SITE_OTHER)
Admission: RE | Admit: 2019-03-19 | Discharge: 2019-03-19 | Disposition: A | Discharge status: Home / Self Care | Payer: No Typology Code available for payment source | Source: Ambulatory Visit | Attending: Primary Care | Admitting: Primary Care

## 2019-03-19 ENCOUNTER — Other Ambulatory Visit: Payer: Self-pay

## 2019-03-19 VITALS — BP 122/82 | HR 100 | Temp 98.4°F | Ht 62.5 in | Wt 127.0 lb

## 2019-03-19 DIAGNOSIS — M79641 Pain in right hand: Secondary | ICD-10-CM | POA: Insufficient documentation

## 2019-03-19 NOTE — Assessment & Plan Note (Signed)
Occurring after mild trauma. Exam today overall stable. Plain films negative for fracture or dislocation.  Suspect sprain and will have her continue with conservative treatment. Increase Ibuprofen to 800 mg 2-3 times daily. Continue Ice. Discussed use of bracing/ACE bandage for support.   Follow up PRN.

## 2019-03-19 NOTE — Progress Notes (Signed)
Subjective:    Patient ID: Stephanie [name], female    DOB: 11/09/1996, 23 y.o.   MRN: [id number]  HPI  [name] is a 23 year old female who presents today with a chief complaint of hand pain.  She was in her car three days ago, was upset, hit her right hand on her steering wheel hard out of aggression. She had her fingers balled up in a fist. Since then she's noticed pain. The majotry of her pain is to the right ulnar side of her wrist starting at the MCP joint of her 5th digit down to her mid anterior forearm.   She did notice swelling to the 1st through 3rd digits shortly after the incident which has reduced. Since then her pain is about the same. Pain is worse with applying pressure with her body, texting on her phone, balling her hand in a fist. She's been taking Ibuprofen 800 mg once daily, also applying ice. She can move all fingers.   Review of Systems  Musculoskeletal: Positive for arthralgias and joint swelling.  Skin: Negative for color change.       Past Medical History:  Diagnosis Date  . Anemia   . Anxiety 01/28/2013   no meds  . Depression    no meds  . IBS (irritable bowel syndrome)   . Migraines    HA every other day - otc med prn  . Recurrent streptococcal tonsillitis   . Seasonal allergies    Seasonal environmental allergies  . Vision abnormalities    Myopia; patient has history of devloping ulcers when she wore contacts.     Social History   Socioeconomic History  . Marital status: Single    Spouse name: Not on file  . Number of children: Not on file  . Years of education: Not on file  . Highest education level: Not on file  Occupational History  . Not on file  Social Needs  . Financial resource strain: Not on file  . Food insecurity:    Worry: Not on file    Inability: Not on file  . Transportation needs:    Medical: Not on file    Non-medical: Not on file  Tobacco Use  . Smoking status: Never Smoker  . Smokeless tobacco: Never Used   Substance and Sexual Activity  . Alcohol use: Never    Frequency: Never    Comment: socially  . Drug use: Never    Comment: Last use Thursday 10/16/18  . Sexual activity: Not Currently    Partners: Male    Birth control/protection: Implant    Comment: broke up with first sexual partner 1 month ago  Lifestyle  . Physical activity:    Days per week: Not on file    Minutes per session: Not on file  . Stress: Not on file  Relationships  . Social connections:    Talks on phone: Not on file    Gets together: Not on file    Attends religious service: Not on file    Active member of club or organization: Not on file    Attends meetings of clubs or organizations: Not on file    Relationship status: Not on file  . Intimate partner violence:    Fear of current or ex partner: Not on file    Emotionally abused: Not on file    Physically abused: Not on file    Forced sexual activity: Not on file  Other Topics Concern  . Not  on file  Social History Narrative   Single.    Student at [employer].   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    Enjoys listening to music, hanging out with friends.     Past Surgical History:  Procedure Laterality Date  . COLONOSCOPY     IBS  . COLONOSCOPY     IBS  . LAPAROSCOPY N/A 10/20/2018   Procedure: LAPAROSCOPY DIAGNOSTIC possible removal of endometriosis;  Surgeon: Mitchel Honour, DO;  Location: [facility];  Service: Gynecology;  Laterality: N/A;    Family History  Problem Relation Age of Onset  . Depression Mother   . Migraines Mother   . Migraines Father     Allergies  Allergen Reactions  . Coconut Oil Itching    Mouth itches, but no impaired breathing (per patient)  . Septra [Sulfamethoxazole-Trimethoprim] Nausea Only and Other (See Comments)    Patient took a few courses of this and it ended up being stopped by a MD because it had started causing "bad stomach pain" (per the patient)    Current Outpatient Medications on File Prior to  Visit  Medication Sig Dispense Refill  . clindamycin (CLEOCIN T) 1 % lotion Apply 1 application topically as needed (as directed- to affected areas).     Marland Kitchen doxycycline (VIBRA-TABS) 100 MG tablet Take 100 mg by mouth 2 (two) times daily.    Marland Kitchen etonogestrel (NEXPLANON) 68 MG IMPL implant 1 each by Subdermal route once.    Marland Kitchen ibuprofen (ADVIL,MOTRIN) 800 MG tablet Take 1 tablet (800 mg total) by mouth every 8 (eight) hours as needed for moderate pain. 30 tablet 0  . ondansetron (ZOFRAN) 4 MG tablet Take 4 mg by mouth every 8 (eight) hours as needed for nausea or vomiting.      No current facility-administered medications on file prior to visit.     BP 122/82   Pulse 100   Temp 98.4 F (36.9 C) (Tympanic)   Ht 5' 2.5" (1.588 m)   Wt 127 lb (57.6 kg)   LMP 03/18/2019   SpO2 98%   BMI 22.86 kg/m    Objective:   Physical Exam  Constitutional: She appears well-nourished.  Musculoskeletal:       Arms:       Hands:     Comments: Overall good ROM to all digits and wrist, mild swelling to right lateral wrist. Tenderness to right lateral wrist from 5th MCP joint to ulnar wrist. Non tender to anterior forearm.   Skin: Skin is warm and dry. No erythema.           Assessment & Plan:

## 2019-03-19 NOTE — Patient Instructions (Signed)
Complete xray(s) prior to leaving today. I will notify you of your results once received.  Increase your Ibuprofen to 800 mg every 8 hours for pain and inflammation.   You can ice the wrist and forearm to reduce pain.  Consider wearing a brace or wrapping with an ace bandage for support.   I'll be in touch soon! It was a pleasure to see you today!

## 2019-04-20 ENCOUNTER — Emergency Department (HOSPITAL_COMMUNITY)
Admission: EM | Admit: 2019-04-20 | Discharge: 2019-04-21 | Disposition: A | Discharge status: Home / Self Care | Payer: No Typology Code available for payment source | Attending: Emergency Medicine | Admitting: Emergency Medicine

## 2019-04-20 ENCOUNTER — Other Ambulatory Visit: Payer: Self-pay

## 2019-04-20 ENCOUNTER — Ambulatory Visit (INDEPENDENT_AMBULATORY_CARE_PROVIDER_SITE_OTHER): Payer: No Typology Code available for payment source | Admitting: Primary Care

## 2019-04-20 ENCOUNTER — Telehealth: Payer: Self-pay

## 2019-04-20 ENCOUNTER — Encounter: Payer: Self-pay | Admitting: Primary Care

## 2019-04-20 VITALS — Temp 104.0°F | Wt 127.0 lb

## 2019-04-20 DIAGNOSIS — R809 Proteinuria, unspecified: Secondary | ICD-10-CM | POA: Diagnosis not present

## 2019-04-20 DIAGNOSIS — Z79899 Other long term (current) drug therapy: Secondary | ICD-10-CM | POA: Insufficient documentation

## 2019-04-20 DIAGNOSIS — R112 Nausea with vomiting, unspecified: Secondary | ICD-10-CM | POA: Diagnosis not present

## 2019-04-20 DIAGNOSIS — R1110 Vomiting, unspecified: Secondary | ICD-10-CM | POA: Diagnosis present

## 2019-04-20 DIAGNOSIS — K859 Acute pancreatitis without necrosis or infection, unspecified: Secondary | ICD-10-CM | POA: Insufficient documentation

## 2019-04-20 DIAGNOSIS — O219 Vomiting of pregnancy, unspecified: Secondary | ICD-10-CM | POA: Insufficient documentation

## 2019-04-20 LAB — CBC
HCT: 42 % (ref 36.0–46.0)
Hemoglobin: 13.7 g/dL (ref 12.0–15.0)
MCH: 28.7 pg (ref 26.0–34.0)
MCHC: 32.6 g/dL (ref 30.0–36.0)
MCV: 88.1 fL (ref 80.0–100.0)
Platelets: 273 10*3/uL (ref 150–400)
RBC: 4.77 MIL/uL (ref 3.87–5.11)
RDW: 13.8 % (ref 11.5–15.5)
WBC: 6.5 10*3/uL (ref 4.0–10.5)
nRBC: 0 % (ref 0.0–0.2)

## 2019-04-20 LAB — COMPREHENSIVE METABOLIC PANEL
ALT: 20 U/L (ref 0–44)
AST: 21 U/L (ref 15–41)
Albumin: 4.4 g/dL (ref 3.5–5.0)
Alkaline Phosphatase: 41 U/L (ref 38–126)
Anion gap: 11 (ref 5–15)
BUN: 13 mg/dL (ref 6–20)
CO2: 25 mmol/L (ref 22–32)
Calcium: 9.6 mg/dL (ref 8.9–10.3)
Chloride: 102 mmol/L (ref 98–111)
Creatinine, Ser: 0.95 mg/dL (ref 0.44–1.00)
GFR calc Af Amer: >60 mL/min (ref >60)
GFR calc non Af Amer: >60 mL/min (ref >60)
Glucose, Bld: 96 mg/dL (ref 70–99)
Potassium: 3.5 mmol/L (ref 3.5–5.1)
Sodium: 138 mmol/L (ref 135–145)
Total Bilirubin: 0.9 mg/dL (ref 0.3–1.2)
Total Protein: 7.9 g/dL (ref 6.5–8.1)

## 2019-04-20 LAB — URINALYSIS, ROUTINE W REFLEX MICROSCOPIC
Bacteria, UA: NONE SEEN
Bilirubin Urine: NEGATIVE
Glucose, UA: NEGATIVE mg/dL
Ketones, ur: 20 mg/dL — AB
Leukocytes,Ua: NEGATIVE
Nitrite: NEGATIVE
Protein, ur: 30 mg/dL — AB
Specific Gravity, Urine: 1.033 — ABNORMAL HIGH (ref 1.005–1.030)
pH: 5 (ref 5.0–8.0)

## 2019-04-20 LAB — LIPASE, BLOOD: Lipase: 248 U/L — ABNORMAL HIGH (ref 11–51)

## 2019-04-20 LAB — I-STAT BETA HCG BLOOD, ED (MC, WL, AP ONLY): I-stat hCG, quantitative: <5 m[IU]/mL (ref <5)

## 2019-04-20 MED ORDER — HALOPERIDOL LACTATE 5 MG/ML IJ SOLN
2.5000 mg | Freq: Once | INTRAMUSCULAR | Status: AC
  Administered 2019-04-20: 2.5 mg via INTRAVENOUS
  Filled 2019-04-20: qty 1

## 2019-04-20 MED ORDER — LACTATED RINGERS IV BOLUS
1000.0000 mL | Freq: Once | INTRAVENOUS | Status: AC
  Administered 2019-04-20: 1000 mL via INTRAVENOUS

## 2019-04-20 MED ORDER — PROMETHAZINE HCL 12.5 MG PO TABS: 12.5000 mg | ORAL_TABLET | Freq: Three times a day (TID) | ORAL | 0 refills | Status: DC | PRN

## 2019-04-20 MED ORDER — DROPERIDOL 2.5 MG/ML IJ SOLN: 2.5000 mg | Freq: Once | INTRAMUSCULAR | Status: DC

## 2019-04-20 NOTE — ED Provider Notes (Addendum)
[facility] EMERGENCY DEPARTMENT Provider Note   CSN: [id number] Arrival date & time: 04/20/19  1758    History   Chief Complaint Chief Complaint  Patient presents with  . Emesis    HPI Mackinze Kopec is a 23 y.o. female.     The history is provided by the patient and medical records.  Emesis  Severity:  Moderate Duration:  2 days Timing:  Constant Number of daily episodes:  5-8x/day Quality:  Stomach contents and undigested food Able to tolerate:  Liquids How soon after eating does vomiting occur:  30 minutes Progression:  Worsening Chronicity:  New Recent urination:  Decreased Context: not post-tussive and not self-induced   Relieved by:  Nothing Worsened by:  Ice chips and liquids Ineffective treatments:  Ice chips Associated symptoms: abdominal pain, chills and myalgias   Associated symptoms: no cough, no diarrhea, no headaches and no URI   Risk factors: no alcohol use, not pregnant, no prior abdominal surgery, no sick contacts, no suspect food intake and no travel to endemic areas     Past Medical History:  Diagnosis Date  . Anemia   . Anxiety 01/28/2013   no meds  . Depression    no meds  . IBS (irritable bowel syndrome)   . Migraines    HA every other day - otc med prn  . Recurrent streptococcal tonsillitis   . Seasonal allergies    Seasonal environmental allergies  . Vision abnormalities    Myopia; patient has history of devloping ulcers when she wore contacts.    Patient Active Problem List   Diagnosis Date Noted  . Acute pancreatitis without infection or necrosis 04/21/2019  . Nausea and vomiting 04/20/2019  . Pain of right hand 03/19/2019  . Axillary hidradenitis suppurativa 03/05/2019  . Recurrent infection of skin 02/04/2019  . Sebaceous cyst 08/04/2018  . Preventative health care 11/20/2017  . Menorrhagia with regular cycle 09/24/2014  . Migraines 08/04/2014  . Abnormal vision screen 04/03/2013  . Loss of weight  04/03/2013  . Allergic rhinitis 04/03/2013  . MDD (major depressive disorder), single episode, severe (HCC) 01/28/2013    Class: Chronic  . Generalized anxiety disorder 01/28/2013    Past Surgical History:  Procedure Laterality Date  . COLONOSCOPY     IBS  . COLONOSCOPY     IBS  . LAPAROSCOPY N/A 10/20/2018   Procedure: LAPAROSCOPY DIAGNOSTIC possible removal of endometriosis;  Surgeon: Mitchel Honour, DO;  Location: [facility];  Service: Gynecology;  Laterality: N/A;     OB History    Gravida  0   Para  0   Term  0   Preterm  0   AB  0   Living  0     SAB  0   TAB  0   Ectopic  0   Multiple  0   Live Births  0        Obstetric Comments  Menstrual age: 39  Age 1st Pregnancy: N/A          Home Medications    Prior to Admission medications   Medication Sig Start Date End Date Taking? Authorizing Provider  acetaminophen (TYLENOL) 500 MG tablet Take 1,500 mg by mouth every 6 (six) hours as needed for mild pain.   Yes [provider]  etonogestrel (NEXPLANON) 68 MG IMPL implant 1 each by Subdermal route once.   Yes [provider]  ibuprofen (ADVIL,MOTRIN) 800 MG tablet Take 1 tablet (800 mg total)  by mouth every 8 (eight) hours as needed for moderate pain. 02/04/19  Yes Doreene Nestlark, Katherine K, NP  HYDROcodone-acetaminophen (NORCO/VICODIN) 5-325 MG tablet Take 1 tablet by mouth every 6 (six) hours as needed for severe pain. 04/21/19   Muthersbaugh, Dahlia ClientHannah, PA-C  ondansetron (ZOFRAN ODT) 4 MG disintegrating tablet 4mg  ODT q4 hours prn nausea/vomit 04/21/19   Muthersbaugh, Dahlia ClientHannah, PA-C    Family History Family History  Problem Relation Age of Onset  . Depression Mother   . Migraines Mother   . Migraines Father     Social History Social History   Tobacco Use  . Smoking status: Never Smoker  . Smokeless tobacco: Never Used  Substance Use Topics  . Alcohol use: Never    Frequency: Never    Comment: socially  . Drug use: Never    Comment:  Last use Thursday 10/16/18     Allergies   Coconut oil and Septra [sulfamethoxazole-trimethoprim]   Review of Systems Review of Systems  Constitutional: Positive for chills.  Respiratory: Negative for cough.   Gastrointestinal: Positive for abdominal pain and vomiting. Negative for diarrhea.  Musculoskeletal: Positive for myalgias.  Neurological: Negative for headaches.  All other systems reviewed and are negative.    Physical Exam Updated Vital Signs BP 116/75   Pulse (!) 118   Temp 99.2 F (37.3 C) (Oral)   Resp (!) 24   Ht 5' 2.5" (1.588 m)   Wt 54.9 kg   SpO2 100%   BMI 21.78 kg/m   Physical Exam Vitals signs and nursing note reviewed.  Constitutional:      General: She is not in acute distress.    Appearance: She is well-developed.  HENT:     Head: Normocephalic and atraumatic.     Mouth/Throat:     Mouth: Mucous membranes are dry.  Eyes:     Conjunctiva/sclera: Conjunctivae normal.  Neck:     Musculoskeletal: Neck supple.  Cardiovascular:     Rate and Rhythm: Tachycardia present.  Pulmonary:     Effort: Pulmonary effort is normal. No respiratory distress.  Abdominal:     Palpations: Abdomen is soft.     Tenderness: There is abdominal tenderness. There is no right CVA tenderness, left CVA tenderness, guarding or rebound.  Musculoskeletal:     Right lower leg: No edema.     Left lower leg: No edema.  Skin:    General: Skin is warm and dry.     Capillary Refill: Capillary refill takes 2 to 3 seconds.     Coloration: Skin is pale.     Findings: No rash.  Neurological:     Mental Status: She is alert and oriented to person, place, and time.      ED Treatments / Results  Labs (all labs ordered are listed, but only abnormal results are displayed) Labs Reviewed  LIPASE, BLOOD - Abnormal; Notable for the following components:      Result Value   Lipase 248 (*)    All other components within normal limits  URINALYSIS, ROUTINE W REFLEX MICROSCOPIC  - Abnormal; Notable for the following components:   APPearance HAZY (*)    Specific Gravity, Urine 1.033 (*)    Hgb urine dipstick MODERATE (*)    Ketones, ur 20 (*)    Protein, ur 30 (*)    All other components within normal limits  COMPREHENSIVE METABOLIC PANEL  CBC  I-STAT BETA HCG BLOOD, ED ([facility], [facility], [facility] ONLY)    EKG None  Radiology No  results found.  Procedures Procedures (including critical care time)  Medications Ordered in ED Medications  lactated ringers bolus 1,000 mL (0 mLs Intravenous Stopped 04/21/19 0030)  haloperidol lactate (HALDOL) injection 2.5 mg (2.5 mg Intravenous Given 04/20/19 2256)     Initial Impression / Assessment and Plan / ED Course  I have reviewed the triage vital signs and the nursing notes.  Pertinent labs & imaging results that were available during my care of the patient were reviewed by me and considered in my medical decision making (see chart for details).  Clinical Course as of Apr 23 2013  Mon Apr 20, 2019  2347 Urine is concentrated with ketones, protein and hgb.  Suspect this is related to dehydration as pt denies all urinary symptoms.  No leuks, nitrites, WBC or bacteria. Pt will need f/u on proteinuria.  Pt informed of this.   Specific Gravity, Urine(!): 1.033 [HM]  Tue Apr 21, 2019  0037 Patient reports abdominal pain is largely resolved.  She states she is feeling better.   [HM]    Clinical Course User Index [HM] Muthersbaugh, Dahlia Client, PA-C       Lyra Alaimo is a 23 y.o. female who presents with abdominal pain, nausea, vomiting PMH: no reported pmh, currently on menstrual cycle  Upon initial assessment patient, tachy, hr 109 bpm, not hypotensive, abd pain w/o peritonitis, no increased wob, afebrile Lipase elevated, denies hx of gallstones, etoh, uses NSAIDs regularly for periods and cramps, no bloody emesis or diarrhea. No pelvic complaints, no discharge. Negative murphy sign, no pain at McBurney's point.  Preg  negative. No significant aki. No significant anemia or leukocytosis.  Fever documented by PCP note earlier today of 104, pt denies any fevers this high, states she maybe had low grade fever, pt states her pcp did not tell her she had a fever that high if accurate, PCPs note says likely gastroenteritis, sent home with supportive care therapies, pt presented bc she had additional episode of emesis not because of worsening pain.  Pt afebrile here in ed on check.  Will give IVF bolus, treat symptoms, po challenge and reassess.  Patient care handed off to oncoming provider at 1117pm, dispo per reassessment after fluids and symptomatic treatment. The care of this patient was supervised by Dr. Jeraldine Loots, who agreed with the plan and management of the patient.  Final Clinical Impressions(s) / ED Diagnoses   Final diagnoses:  Acute pancreatitis, unspecified complication status, unspecified pancreatitis type  Proteinuria, unspecified type    ED Discharge Orders         Ordered    ondansetron (ZOFRAN ODT) 4 MG disintegrating tablet     04/21/19 0036    HYDROcodone-acetaminophen (NORCO/VICODIN) 5-325 MG tablet  Every 6 hours PRN     04/21/19 0036              Erick Alley, MD 04/20/19 2337    Erick Alley, MD 04/24/19 2015    Gerhard Munch, MD 04/27/19 [phone]

## 2019-04-20 NOTE — Progress Notes (Signed)
Subjective:    Patient ID: Stephanie [name], female    DOB: [birth year], 23 y.o.   MRN: [id number]  HPI  Virtual Visit via Video Note  I connected with Stephanie [name] on 04/20/19 at  2:40 PM EDT by a video enabled telemedicine application and verified that I am speaking with the correct person using two identifiers.  Location: Patient: Home Provider: Office   I discussed the limitations of evaluation and management by telemedicine and the availability of in person appointments. The patient expressed understanding and agreed to proceed.  History of Present Illness:  Stephanie [name] is a 23 year old female who presents today with a chief complaint of nausea and vomiting.  She started her menstrual cycle two days ago, experienced back pain and cramping. Yesterday she woke up feeling nauseated so she took Zofran without improvement. She then vomited for a total of 6 times throughout the day yesterday. Since then she's felt lightheaded, dizzy, weak. She's not had anything to eat since Saturday afternoon. She's tried to eat several times but couldn't keep anything down. Her last attempt to eat was a sandwich last night but vomited it back up. She's drinking water in small sips, she hasn't completed a full 16 ounce bottle of water today.   Her last episode of vomiting was 5 am today. She woke up this morning with a 100.4 fever, has not rechecked since. She's taken Ibuprofen for her menstrual cramping without much improvement. Her pack pain dose not radiation to her groin. She denies diarrhea, dysuria, urinary frequency, exposure to Covid-19. No one else in her house has symptoms. She has not urinated today.    Observations/Objective:  Alert and oriented. Appears tired, not sickly. No distress. Speaking in complete sentences. Appears euvolemic.  Assessment and Plan:  Likely viral gastritis, lower risk for Covid-19 given that she's had a rapid improvement in her vomiting over the last  12 hours.  Check labs today including CMP and CBC. Stressed the importance of proper hydration with water and Gatorade. Discussed to go to the hospital if very vomiting reoccurs with multiple episodes, if she's not urinated by this evening, if she can't drink water. Rx for promethazine sent to pharmacy as ondansetron isn't helping. Strict ED precautions provided. She will send me an update tomorrow.  Doreene Nest, NP   Follow Up Instructions:  Call the main line to schedule a lab only appointment as discussed.  You must push your intake of water or Gatorade as discussed.   You can take the promethazine 12.5 mg anti-nausea medication every 8 hours as needed. Stop taking Zofran. I sent the promethazine to your pharmacy.  Go to the hospital if you cannot drink, if your vomiting returns, you have not urinated by the end of the day, your stomach pain gets worse.  It was a pleasure to see you today! Mayra Reel, NP-C    I discussed the assessment and treatment plan with the patient. The patient was provided an opportunity to ask questions and all were answered. The patient agreed with the plan and demonstrated an understanding of the instructions.   The patient was advised to call back or seek an in-person evaluation if the symptoms worsen or if the condition fails to improve as anticipated.     Doreene Nest, NP    Review of Systems  Constitutional: Positive for chills, fatigue and fever.  Gastrointestinal: Positive for abdominal pain, constipation, nausea and vomiting. Negative for diarrhea.  Musculoskeletal: Positive for  myalgias.       Past Medical History:  Diagnosis Date  . Anemia   . Anxiety 01/28/2013   no meds  . Depression    no meds  . IBS (irritable bowel syndrome)   . Migraines    HA every other day - otc med prn  . Recurrent streptococcal tonsillitis   . Seasonal allergies    Seasonal environmental allergies  . Vision abnormalities    Myopia;  patient has history of devloping ulcers when she wore contacts.     Social History   Socioeconomic History  . Marital status: Single    Spouse name: Not on file  . Number of children: Not on file  . Years of education: Not on file  . Highest education level: Not on file  Occupational History  . Not on file  Social Needs  . Financial resource strain: Not on file  . Food insecurity:    Worry: Not on file    Inability: Not on file  . Transportation needs:    Medical: Not on file    Non-medical: Not on file  Tobacco Use  . Smoking status: Never Smoker  . Smokeless tobacco: Never Used  Substance and Sexual Activity  . Alcohol use: Never    Frequency: Never    Comment: socially  . Drug use: Never    Comment: Last use Thursday 10/16/18  . Sexual activity: Not Currently    Partners: Male    Birth control/protection: Implant    Comment: broke up with first sexual partner 1 month ago  Lifestyle  . Physical activity:    Days per week: Not on file    Minutes per session: Not on file  . Stress: Not on file  Relationships  . Social connections:    Talks on phone: Not on file    Gets together: Not on file    Attends religious service: Not on file    Active member of club or organization: Not on file    Attends meetings of clubs or organizations: Not on file    Relationship status: Not on file  . Intimate partner violence:    Fear of current or ex partner: Not on file    Emotionally abused: Not on file    Physically abused: Not on file    Forced sexual activity: Not on file  Other Topics Concern  . Not on file  Social History Narrative   Single.    Student at [employer].   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    Enjoys listening to music, hanging out with friends.     Past Surgical History:  Procedure Laterality Date  . COLONOSCOPY     IBS  . COLONOSCOPY     IBS  . LAPAROSCOPY N/A 10/20/2018   Procedure: LAPAROSCOPY DIAGNOSTIC possible removal of  endometriosis;  Surgeon: Mitchel Honour, DO;  Location: [facility];  Service: Gynecology;  Laterality: N/A;    Family History  Problem Relation Age of Onset  . Depression Mother   . Migraines Mother   . Migraines Father     Allergies  Allergen Reactions  . Coconut Oil Itching    Mouth itches, but no impaired breathing (per patient)  . Septra [Sulfamethoxazole-Trimethoprim] Nausea Only and Other (See Comments)    Patient took a few courses of this and it ended up being stopped by a MD because it had started causing "bad stomach pain" (per the patient)    Current Outpatient Medications on  File Prior to Visit  Medication Sig Dispense Refill  . clindamycin (CLEOCIN T) 1 % lotion Apply 1 application topically as needed (as directed- to affected areas).     Marland Kitchen. doxycycline (VIBRA-TABS) 100 MG tablet Take 100 mg by mouth 2 (two) times daily.    Marland Kitchen. etonogestrel (NEXPLANON) 68 MG IMPL implant 1 each by Subdermal route once.    Marland Kitchen. ibuprofen (ADVIL,MOTRIN) 800 MG tablet Take 1 tablet (800 mg total) by mouth every 8 (eight) hours as needed for moderate pain. 30 tablet 0  . ondansetron (ZOFRAN) 4 MG tablet Take 4 mg by mouth every 8 (eight) hours as needed for nausea or vomiting.      No current facility-administered medications on file prior to visit.     Temp (!) 104 F (40 C) (Oral)   Wt 127 lb (57.6 kg)   BMI 22.86 kg/m    Objective:   Physical Exam  Constitutional: She is oriented to person, place, and time. She appears well-nourished. She does not have a sickly appearance.  Appears fatigued  Respiratory: Effort normal.  Neurological: She is alert and oriented to person, place, and time.  Skin: Skin is dry.  Psychiatric: She has a normal mood and affect.           Assessment & Plan:

## 2019-04-20 NOTE — Telephone Encounter (Signed)
[place] [facility] Night - Client TELEPHONE ADVICE RECORD [facility] Patient Name: [facility] Gender: Female DOB: November 19, 1996 Age: 23 Y 4 M 27 D Return Phone Number: [phone] (Primary) Address[place] [address] Client [place] [facility] Night - Client Client Site  [facility] [place] - Night Physician Vernona Rieger - NP Contact Type Call Who Is Calling Patient / Member / Family / Caregiver Call Type Triage / Clinical Relationship To Patient Self Return Phone Number [phone] (Primary) Chief Complaint VOMITING - and no urine output in 8 hours Reason for Call Symptomatic / Request for Health Information Initial Comment Caller states they have been pt. doesn't recall if they have urinated in past 8 hours and is currently on menstrual cycle. Dark green vomiting all day. Translation No Nurse Assessment Nurse: Lawerance Bach, RN, Synetta Fail Date/Time (Eastern Time): 04/19/2019 10:27:04 PM Confirm and document reason for call. If symptomatic, describe symptoms. ---Caller states she has been vomiting dark green bile since 0900 this AM even after taking Zofran x2. Has the patient had close contact with a person known or suspected to have the novel coronavirus illness OR traveled / lives in area with major community spread (including international travel) in the last 14 days from the onset of symptoms? * If Asymptomatic, screen for exposure and travel within the last 14 days. ---No Does the patient have any new or worsening symptoms? ---Yes Will a triage be completed? ---Yes Related visit to physician within the last 2 weeks? ---No Does the PT have any chronic conditions? (i.e. diabetes, asthma, this includes High risk factors for pregnancy, etc.) ---Yes List chronic conditions. ---HS Is the patient pregnant or possibly pregnant? (Ask all females between the ages of 71-55) ---No Is this a  behavioral health or substance abuse call? ---No Guidelines Guideline Title Affirmed Question Affirmed Notes Nurse Date/Time (Eastern Time) Vomiting [1] SEVERE vomiting (e.g., 6 or more times/ day) AND [2] present > 8 hours Burns, RN, Synetta Fail 04/19/2019 10:30:26 PM PLEASE NOTE: All timestamps contained within this report are represented as [place] Standard Time. CONFIDENTIALTY NOTICE: This fax transmission is intended only for the addressee. It contains information that is legally privileged, confidential or otherwise protected from use or disclosure. If you are not the intended recipient, you are strictly prohibited from reviewing, disclosing, copying using or disseminating any of this information or taking any action in reliance on or regarding this information. If you have received this fax in error, please notify us immediately by telephone so that we can arrange for its return to [place]. Phone: [phone], Toll-Free: [phone], Fax: [phone] Page: 2 of 2 Call Id: [id number] Disp. Time Lamount Cohen Time) Disposition Final User 04/19/2019 10:25:12 PM Send to Urgent Loleta Rose, Edden 04/19/2019 10:35:55 PM Go to ED Now (or PCP triage) Yes Lawerance Bach, RN, Rudell Cobb Disagree/Comply Disagree Caller Understands Yes PreDisposition Home Care Care Advice Given Per Guideline GO TO ED NOW (OR PCP TRIAGE): * IF NO PCP (PRIMARY CARE PROVIDER) SECOND-LEVEL TRIAGE: You need to be seen within the next hour. Go to the ED/UCC at _____________ Hospital. Leave as soon as you can. CONTAINER: * You may wish to bring a bucket or container with you in case there is more vomiting during the drive. BRING MEDICINES: * Please bring a list of your current medicines when you go to see the doctor. CARE ADVICE per Vomiting (Adult) guideline. Referrals GO TO FACILITY UNDECIDED

## 2019-04-20 NOTE — Assessment & Plan Note (Signed)
Likely viral gastritis, lower risk for Covid-19 given that she's had a rapid improvement in her vomiting over the last 12 hours.  Check labs today including CMP and CBC. Stressed the importance of proper hydration with water and Gatorade. Discussed to go to the hospital if very vomiting reoccurs with multiple episodes, if she's not urinated by this evening, if she can't drink water. Rx for promethazine sent to pharmacy as ondansetron isn't helping. Strict ED precautions provided. She will send me an update tomorrow.

## 2019-04-20 NOTE — ED Triage Notes (Signed)
Pt arrives POV for eval of blt LQ abd pain onset yesterday evening. Endorses emesis, states no BM for a few days. States oliguric as well

## 2019-04-20 NOTE — Patient Instructions (Signed)
Call the main line to schedule a lab only appointment as discussed.  You must push your intake of water or Gatorade as discussed.   You can take the promethazine 12.5 mg anti-nausea medication every 8 hours as needed. Stop taking Zofran. I sent the promethazine to your pharmacy.  Go to the hospital if you cannot drink, if your vomiting returns, you have not urinated by the end of the day, your stomach pain gets worse.  It was a pleasure to see you today! Mayra Reel, NP-C

## 2019-04-20 NOTE — Telephone Encounter (Signed)
Patient seen virtually by PCP today for symptoms.   Refer to office encounter.

## 2019-04-20 NOTE — ED Provider Notes (Signed)
Care assumed from Dr. Erick Alley.  Please see his full H&P.  In short,  Stephanie [name] is a 23 y.o. female presents for nausea, vomiting and abdominal pain.  Initial evaluation found patient to be tachycardic.  Work-up showed pancreatitis with elevated lipase.  Suspected idiopathic by initial clinician.  Physical Exam  BP 120/79   Pulse 99   Temp 99.2 F (37.3 C) (Oral)   Resp 16   Ht 5' 2.5" (1.588 m)   Wt 54.9 kg   SpO2 100%   BMI 21.78 kg/m   Physical Exam Vitals signs and nursing note reviewed.  Constitutional:      General: She is not in acute distress.    Appearance: She is well-developed.  HENT:     Head: Normocephalic.  Eyes:     General: No scleral icterus.    Conjunctiva/sclera: Conjunctivae normal.  Neck:     Musculoskeletal: Normal range of motion.  Cardiovascular:     Rate and Rhythm: Normal rate.  Pulmonary:     Effort: Pulmonary effort is normal.  Abdominal:     Comments: abd remains soft and nontender on exam  Musculoskeletal: Normal range of motion.  Skin:    General: Skin is warm and dry.  Neurological:     Mental Status: She is alert.     ED Course/Procedures   Clinical Course as of Apr 20 36  Mon Apr 20, 2019  2347 Urine is concentrated with ketones, protein and hgb.  Suspect this is related to dehydration as pt denies all urinary symptoms.  No leuks, nitrites, WBC or bacteria. Pt will need f/u on proteinuria.  Pt informed of this.   Specific Gravity, Urine(!): 1.033 [HM]  Tue Apr 21, 2019  0037 Patient reports abdominal pain is largely resolved.  She states she is feeling better.   [HM]    Clinical Course User Index [HM] Stephanie [name], Stephanie [name]    Procedures  MDM   Pancreatitis.  She is feeling well.  Abdomen is soft and nontender.  Urinalysis does show proteinuria.  Suspect this is secondary to dehydration however patient will need close follow-up with her primary care physician for repeat urinalysis.  Patient discharged  home with Vicodin and Zofran.  Discussed reasons to return immediately to the emergency department.  Patient states understanding and is in agreement with the plan.   Acute pancreatitis, unspecified complication status, unspecified pancreatitis type  Proteinuria, unspecified type    Stephanie [name] 04/21/19 0038    Stephanie Munch, MD 04/23/19 2227

## 2019-04-21 ENCOUNTER — Encounter: Payer: Self-pay | Admitting: Primary Care

## 2019-04-21 ENCOUNTER — Ambulatory Visit (INDEPENDENT_AMBULATORY_CARE_PROVIDER_SITE_OTHER): Payer: No Typology Code available for payment source | Admitting: Primary Care

## 2019-04-21 DIAGNOSIS — K859 Acute pancreatitis without necrosis or infection, unspecified: Secondary | ICD-10-CM

## 2019-04-21 HISTORY — DX: Acute pancreatitis without necrosis or infection, unspecified: K85.90

## 2019-04-21 MED ORDER — HYDROCODONE-ACETAMINOPHEN 5-325 MG PO TABS: 1.0000 | ORAL_TABLET | Freq: Four times a day (QID) | ORAL | 0 refills | Status: DC | PRN

## 2019-04-21 MED ORDER — ONDANSETRON 4 MG PO TBDP: ORAL_TABLET | ORAL | 0 refills | Status: DC

## 2019-04-21 NOTE — Progress Notes (Signed)
Subjective:    Patient ID: Stephanie [name], female    DOB: September 21, 1996, 23 y.o.   MRN: [id number]  HPI  Virtual Visit via Video Note  I connected with Stephanie [name] on 04/21/19 at  2:40 PM EDT by a video enabled telemedicine application and verified that I am speaking with the correct person using two identifiers.  Location: Patient: Home Provider: Office   I discussed the limitations of evaluation and management by telemedicine and the availability of in person appointments. The patient expressed understanding and agreed to proceed.  History of Present Illness:  Stephanie [name] is a 23 year old female who presents today for emergency department follow up.  She presented to [facility] ED on 04/20/2019 within several hours after our virtual visit with a chief complaint of nausea and vomiting. She also reported abdominal pain which was not very probelmatic during our virtual visit.   She was treated with IV fluids. Labs were stable and without leukocytosis except for elevated Lipase. She was diagnosed with acute pancreatitis and sent home with prescriptions for Zofran and Norco.   Since her ED visit she's feeling much better. Her appetite has returned and her nausea has decreased. She is taking Zofran that was provided to her from her ED visit. She denies fevers, vomiting, abdominal pain.    Observations/Objective:  Alert and oriented. Appears well, much better than yesterday.  No distress. Speaking in complete sentences.   Assessment and Plan:  Presented to the ED given persistent symptoms after our visit. Diagnosed with acute pancreatitis and treated accordingly. Today she appears much better and is eating and drinking well. Continue to work on appetite and hydration. Use Zofran PRN. Repeat lipase next week. Follow up PRN.  Follow Up Instructions:  Continue to work on advancing your diet and hydrating well.  Call to schedule a lab appointment for next week.  It was  a pleasure to see you today!    I discussed the assessment and treatment plan with the patient. The patient was provided an opportunity to ask questions and all were answered. The patient agreed with the plan and demonstrated an understanding of the instructions.   The patient was advised to call back or seek an in-person evaluation if the symptoms worsen or if the condition fails to improve as anticipated.   Doreene Nest, NP    Review of Systems  Constitutional: Negative for fatigue and fever.  Respiratory: Negative for shortness of breath.   Gastrointestinal: Positive for nausea. Negative for abdominal pain, diarrhea and vomiting.       Past Medical History:  Diagnosis Date  . Anemia   . Anxiety 01/28/2013   no meds  . Depression    no meds  . IBS (irritable bowel syndrome)   . Migraines    HA every other day - otc med prn  . Recurrent streptococcal tonsillitis   . Seasonal allergies    Seasonal environmental allergies  . Vision abnormalities    Myopia; patient has history of devloping ulcers when she wore contacts.     Social History   Socioeconomic History  . Marital status: Single    Spouse name: Not on file  . Number of children: Not on file  . Years of education: Not on file  . Highest education level: Not on file  Occupational History  . Not on file  Social Needs  . Financial resource strain: Not on file  . Food insecurity:    Worry: Not on file  Inability: Not on file  . Transportation needs:    Medical: Not on file    Non-medical: Not on file  Tobacco Use  . Smoking status: Never Smoker  . Smokeless tobacco: Never Used  Substance and Sexual Activity  . Alcohol use: Never    Frequency: Never    Comment: socially  . Drug use: Never    Comment: Last use Thursday 10/16/18  . Sexual activity: Not Currently    Partners: Male    Birth control/protection: Implant    Comment: broke up with first sexual partner 1 month ago  Lifestyle  .  Physical activity:    Days per week: Not on file    Minutes per session: Not on file  . Stress: Not on file  Relationships  . Social connections:    Talks on phone: Not on file    Gets together: Not on file    Attends religious service: Not on file    Active member of club or organization: Not on file    Attends meetings of clubs or organizations: Not on file    Relationship status: Not on file  . Intimate partner violence:    Fear of current or ex partner: Not on file    Emotionally abused: Not on file    Physically abused: Not on file    Forced sexual activity: Not on file  Other Topics Concern  . Not on file  Social History Narrative   Single.    Student at [employer].   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    Enjoys listening to music, hanging out with friends.     Past Surgical History:  Procedure Laterality Date  . COLONOSCOPY     IBS  . COLONOSCOPY     IBS  . LAPAROSCOPY N/A 10/20/2018   Procedure: LAPAROSCOPY DIAGNOSTIC possible removal of endometriosis;  Surgeon: Mitchel HonourMorris, Megan, DO;  Location: [facility];  Service: Gynecology;  Laterality: N/A;    Family History  Problem Relation Age of Onset  . Depression Mother   . Migraines Mother   . Migraines Father     Allergies  Allergen Reactions  . Coconut Oil Itching    Mouth itches, but no impaired breathing (per patient)  . Septra [Sulfamethoxazole-Trimethoprim] Nausea Only and Other (See Comments)    Patient took a few courses of this and it ended up being stopped by a MD because it had started causing "bad stomach pain" (per the patient)    Current Outpatient Medications on File Prior to Visit  Medication Sig Dispense Refill  . acetaminophen (TYLENOL) 500 MG tablet Take 1,500 mg by mouth every 6 (six) hours as needed for mild pain.    Marland Kitchen. etonogestrel (NEXPLANON) 68 MG IMPL implant 1 each by Subdermal route once.    Marland Kitchen. HYDROcodone-acetaminophen (NORCO/VICODIN) 5-325 MG tablet Take 1 tablet by mouth every 6  (six) hours as needed for severe pain. 5 tablet 0  . ibuprofen (ADVIL,MOTRIN) 800 MG tablet Take 1 tablet (800 mg total) by mouth every 8 (eight) hours as needed for moderate pain. 30 tablet 0  . ondansetron (ZOFRAN ODT) 4 MG disintegrating tablet 4mg  ODT q4 hours prn nausea/vomit 8 tablet 0   No current facility-administered medications on file prior to visit.     There were no vitals taken for this visit.   Objective:   Physical Exam  Constitutional: She is oriented to person, place, and time. She appears well-nourished. She does not have a sickly appearance. She  does not appear ill.  Respiratory: Effort normal.  Neurological: She is alert and oriented to person, place, and time.  Psychiatric: She has a normal mood and affect.           Assessment & Plan:

## 2019-04-21 NOTE — Patient Instructions (Signed)
Continue to work on [employer] and hydrating well.  Call to schedule a lab appointment for next week.  It was a pleasure to see you today!

## 2019-04-21 NOTE — Assessment & Plan Note (Signed)
Presented to the ED given persistent symptoms after our visit. Diagnosed with acute pancreatitis and treated accordingly. Today she appears much better and is eating and drinking well. Continue to work on appetite and hydration. Use Zofran PRN. Repeat lipase next week. Follow up PRN.

## 2019-04-21 NOTE — Discharge Instructions (Signed)
1. Medications: zofran, vicodin, usual home medications 2. Treatment: rest, drink plenty of fluids, advance diet slowly 3. Follow Up: Please followup with your primary doctor in 2 days for discussion of your diagnoses and further evaluation after today's visit; if you do not have a primary care doctor use the resource guide provided to find one; Please return to the ER for persistent vomiting, high fevers or worsening symptoms  

## 2019-04-29 ENCOUNTER — Ambulatory Visit: Payer: No Typology Code available for payment source | Admitting: Primary Care

## 2019-05-01 ENCOUNTER — Other Ambulatory Visit: Payer: No Typology Code available for payment source

## 2019-05-01 ENCOUNTER — Other Ambulatory Visit: Payer: Self-pay

## 2019-05-20 ENCOUNTER — Telehealth: Payer: Self-pay | Admitting: *Deleted

## 2019-05-20 ENCOUNTER — Telehealth: Payer: Self-pay

## 2019-05-20 NOTE — Telephone Encounter (Signed)
Message left for the patient to call back to schedule an appointment with Dr Bary Castilla for assessment of Hidradenitis. Was seen by Dr Rosana Hoes for this but he does not do this type of surgery.

## 2019-05-20 NOTE — Telephone Encounter (Signed)
Appointment scheduled with Dr. Bary Castilla for 06-02-19 at 10:45 am.

## 2019-06-02 ENCOUNTER — Other Ambulatory Visit: Payer: Self-pay

## 2019-06-02 ENCOUNTER — Ambulatory Visit (INDEPENDENT_AMBULATORY_CARE_PROVIDER_SITE_OTHER): Payer: No Typology Code available for payment source | Admitting: General Surgery

## 2019-06-02 ENCOUNTER — Encounter: Payer: Self-pay | Admitting: General Surgery

## 2019-06-02 VITALS — BP 120/80 | HR 90 | Temp 98.1°F | Ht 66.0 in | Wt 130.0 lb

## 2019-06-02 DIAGNOSIS — L732 Hidradenitis suppurativa: Secondary | ICD-10-CM

## 2019-06-02 MED ORDER — DOXYCYCLINE HYCLATE 100 MG PO TABS: 100.0000 mg | ORAL_TABLET | Freq: Two times a day (BID) | ORAL | 0 refills | Status: AC

## 2019-06-02 MED FILL — DOXYCYCLINE HYCLATE 100 MG: 100 | 14 days supply | Qty: 28 | Fill #0

## 2019-06-02 NOTE — Patient Instructions (Addendum)
Hidradenitis Suppurativa Hidradenitis suppurativa is a long-term (chronic) skin disease. It is similar to a severe form of acne, but it affects areas of the body where acne would be unusual, especially areas of the body where skin rubs against skin and becomes moist. These include:  Underarms.  Groin.  Genital area.  Buttocks.  Upper thighs.  Breasts. Hidradenitis suppurativa may start out as small lumps or pimples caused by blocked sweat glands or hair follicles. Pimples may develop into deep sores that break open (rupture) and drain pus. Over time, affected areas of skin may thicken and become scarred. This condition is rare and does not spread from person to person (non-contagious). What are the causes? The exact cause of this condition is not known. It may be related to:  Female and female hormones.  An overactive disease-fighting system (immune system). The immune system may over-react to blocked hair follicles or sweat glands and cause swelling and pus-filled sores. What increases the risk? You are more likely to develop this condition if you:  Are female.  Are 11-55 years old.  Have a family history of hidradenitis suppurativa.  Have a personal history of acne.  Are overweight.  Smoke.  Take the medicine lithium. What are the signs or symptoms? The first symptoms are usually painful bumps in the skin, similar to pimples. The condition may get worse over time (progress), or it may only cause mild symptoms. If the disease progresses, symptoms may include:  Skin bumps getting bigger and growing deeper into the skin.  Bumps rupturing and draining pus.  Itchy, infected skin.  Skin getting thicker and scarred.  Tunnels under the skin (fistulas) where pus drains from a bump.  Pain during daily activities, such as pain during walking if your groin area is affected.  Emotional problems, such as stress or depression. This condition may affect your appearance and your  ability or willingness to wear certain clothes or do certain activities. How is this diagnosed? This condition is diagnosed by a health care provider who specializes in skin diseases (dermatologist). You may be diagnosed based on:  Your symptoms and medical history.  A physical exam.  Testing a pus sample for infection.  Blood tests. How is this treated? Your treatment will depend on how severe your symptoms are. The same treatment will not work for everybody with this condition. You may need to try several treatments to find what works best for you. Treatment may include:  Cleaning and bandaging (dressing) your wounds as needed.  Lifestyle changes, such as new skin care routines.  Taking medicines, such as: ? Antibiotics. ? Acne medicines. ? Medicines to reduce the activity of the immune system. ? A diabetes medicine (metformin). ? Birth control pills, for women. ? Steroids to reduce swelling and pain.  Working with a mental health care provider, if you experience emotional distress due to this condition. If you have severe symptoms that do not get better with medicine, you may need surgery. Surgery may involve:  Using a laser to clear the skin and remove hair follicles.  Opening and draining deep sores.  Removing the areas of skin that are diseased and scarred. Follow these instructions at home: Medicines   Take over-the-counter and prescription medicines only as told by your health care provider.  If you were prescribed an antibiotic medicine, take it as told by your health care provider. Do not stop taking the antibiotic even if your condition improves. Skin care  If you have open wounds, cover   them with a clean dressing as told by your health care provider. Keep wounds clean by washing them gently with soap and water when you bathe.  Do not shave the areas where you get hidradenitis suppurativa.  Do not wear deodorant.  Wear loose-fitting clothes.  Try to avoid  getting overheated or sweaty. If you get sweaty or wet, change into clean, dry clothes as soon as you can.  To help relieve pain and itchiness, cover sore areas with a warm, clean washcloth (warm compress) for 5-10 minutes as often as needed.  If told by your health care provider, take a bleach bath twice a week: ? Fill your bathtub halfway with water. ? Pour in  cup of unscented household bleach. ? Soak in the tub for 5-10 minutes. ? Only soak from the neck down. Avoid water on your face and hair. ? Shower to rinse off the bleach from your skin. General instructions  Learn as much as you can about your disease so that you have an active role in your treatment. Work closely with your health care provider to find treatments that work for you.  If you are overweight, work with your health care provider to lose weight as recommended.  Do not use any products that contain nicotine or tobacco, such as cigarettes and e-cigarettes. If you need help quitting, ask your health care provider.  If you struggle with living with this condition, talk with your health care provider or work with a mental health care provider as recommended.  Keep all follow-up visits as told by your health care provider. This is important. Where to find more information  Hidradenitis Ivy.: https://www.hs-foundation.org/ Contact a health care provider if you have:  A flare-up of hidradenitis suppurativa.  A fever or chills.  Trouble controlling your symptoms at home.  Trouble doing your daily activities because of your symptoms.  Trouble dealing with emotional problems related to your condition. Summary  Hidradenitis suppurativa is a long-term (chronic) skin disease. It is similar to a severe form of acne, but it affects areas of the body where acne would be unusual.  The first symptoms are usually painful bumps in the skin, similar to pimples. The condition may get worse over time  (progress), or it may only cause mild symptoms.  If you have open wounds, cover them with a clean dressing as told by your health care provider. Keep wounds clean by washing them gently with soap and water when you bathe.  Besides skin care, treatment may include medicines, laser treatment, and surgery. This information is not intended to replace advice given to you by your health care provider. Make sure you discuss any questions you have with your health care provider. Document Released: 06/19/2004 Document Revised: 11/13/2017 Document Reviewed: 11/13/2017 Elsevier Patient Education  2020 [employer].    Call in two weeks and report .

## 2019-06-02 NOTE — Progress Notes (Signed)
Patient ID: Stephanie [name], female   DOB: 08/29/1996, 23 y.o.   MRN: [id number]  Chief Complaint  Patient presents with  . Follow-up    HPI Stephanie ManDajanique Ealey is a 23 y.o. female here today for a evaluation of hidradenitis. Patient saw Dr. Earlene Plateravis on 03/05/2019. She states she noticed this area about a week ago. The area started to get bigger and painful. She has had the left axillary drainage a couple of time.   The patient just completed her junior year at [employer] online.  She is looking good therapy school. HPI  Past Medical History:  Diagnosis Date  . Anemia   . Anxiety 01/28/2013   no meds  . Depression    no meds  . IBS (irritable bowel syndrome)   . Migraines    HA every other day - otc med prn  . Recurrent streptococcal tonsillitis   . Seasonal allergies    Seasonal environmental allergies  . Vision abnormalities    Myopia; patient has history of devloping ulcers when she wore contacts.    Past Surgical History:  Procedure Laterality Date  . COLONOSCOPY     IBS  . COLONOSCOPY     IBS  . LAPAROSCOPY N/A 10/20/2018   Procedure: LAPAROSCOPY DIAGNOSTIC possible removal of endometriosis;  Surgeon: Mitchel HonourMorris, Megan, DO;  Location: [facility];  Service: Gynecology;  Laterality: N/A;    Family History  Problem Relation Age of Onset  . Depression Mother   . Migraines Mother   . Migraines Father     Social History Social History   Tobacco Use  . Smoking status: Never Smoker  . Smokeless tobacco: Never Used  Substance Use Topics  . Alcohol use: Never    Frequency: Never    Comment: socially  . Drug use: Never    Comment: Last use Thursday 10/16/18    Allergies  Allergen Reactions  . Coconut Flavor     Mouth itches, but no impaired breathing (per patient  . Septra [Sulfamethoxazole-Trimethoprim] Nausea Only and Other (See Comments)    Patient took a few courses of this and it ended up being stopped by a MD because it had started causing "bad stomach  pain" (per the patient)    Current Outpatient Medications  Medication Sig Dispense Refill  . acetaminophen (TYLENOL) 500 MG tablet Take 1,500 mg by mouth every 6 (six) hours as needed for mild pain.    Marland Kitchen. etonogestrel (NEXPLANON) 68 MG IMPL implant 1 each by Subdermal route once.    Marland Kitchen. ibuprofen (ADVIL,MOTRIN) 800 MG tablet Take 1 tablet (800 mg total) by mouth every 8 (eight) hours as needed for moderate pain. 30 tablet 0  . doxycycline (VIBRA-TABS) 100 MG tablet Take 1 tablet (100 mg total) by mouth 2 (two) times daily for 14 days. 28 tablet 0   No current facility-administered medications for this visit.     Review of Systems Review of Systems  Constitutional: Negative.   Respiratory: Negative.   Cardiovascular: Negative.     Blood pressure 120/80, pulse 90, temperature 98.1 F (36.7 C), temperature source Temporal, height 5\' 6"  (1.676 m), weight 130 lb (59 kg), last menstrual period 05/03/2019, SpO2 98 %.  Physical Exam Physical Exam Constitutional:      Appearance: Normal appearance.  Chest:    Skin:    General: Skin is warm and dry.  Neurological:     General: No focal deficit present.     Mental Status: She is alert and oriented to person,  place, and time.     Data Reviewed Records dating back to March 2020.  Previous treatment with both Vibra-Tabs and Bactrim.  Assessment Small foci of hidradenitis, recurrent right axilla.  Plan The patient reports near monthly flareups, frequent trips to the ED for acute changes at least 2 I&D's in the past, one where drainage was noted, 1 mL drainage was not appreciated.  Warm compresses will provide relief while in place but then increased discomfort.  Has used Cleocin cream in the past. With her frequent episodes of inflammation I think she will probably benefit from excision of this area.  It would be ideal to have the acute inflammatory process at a nadir, and for that reason prescription for doxycycline 100 mg twice daily  has been prescribed.  The need to avoid milk and milk based products and calcium 2 hours before or after the medication was viewed.  Warm compresses if comforting.  If she resolves her acute discomfort for over 48 hours, she may decrease to 1 tablet/day.  She has been asked to give up follow-up in 2 weeks with her progress.  HPI, Physical Exam, Assessment and Plan have been scribed under the direction and in the presence of Hervey Ard, MD.  Gaspar Cola, CMA  I have completed the exam and reviewed the above documentation for accuracy and completeness.  I agree with the above.  Haematologist has been used and any errors in dictation or transcription are unintentional.  Hervey Ard, M.D., F.A.C.S.  Forest Gleason Calandria Mullings 06/02/2019, 11:10 AM

## 2019-06-05 ENCOUNTER — Encounter: Payer: Self-pay | Admitting: Primary Care

## 2019-06-05 ENCOUNTER — Other Ambulatory Visit: Payer: Self-pay

## 2019-06-05 ENCOUNTER — Ambulatory Visit (INDEPENDENT_AMBULATORY_CARE_PROVIDER_SITE_OTHER): Payer: No Typology Code available for payment source | Admitting: Primary Care

## 2019-06-05 VITALS — BP 120/82 | HR 105 | Temp 98.0°F | Ht 66.0 in | Wt 122.5 lb

## 2019-06-05 DIAGNOSIS — R112 Nausea with vomiting, unspecified: Secondary | ICD-10-CM

## 2019-06-05 DIAGNOSIS — R55 Syncope and collapse: Secondary | ICD-10-CM | POA: Insufficient documentation

## 2019-06-05 DIAGNOSIS — G43701 Chronic migraine without aura, not intractable, with status migrainosus: Secondary | ICD-10-CM | POA: Diagnosis not present

## 2019-06-05 DIAGNOSIS — K859 Acute pancreatitis without necrosis or infection, unspecified: Secondary | ICD-10-CM | POA: Diagnosis not present

## 2019-06-05 LAB — COMPREHENSIVE METABOLIC PANEL
ALT: 13 U/L (ref 0–35)
AST: 19 U/L (ref 0–37)
Albumin: 4.2 g/dL (ref 3.5–5.2)
Alkaline Phosphatase: 34 U/L — ABNORMAL LOW (ref 39–117)
BUN: 11 mg/dL (ref 6–23)
CO2: 28 mEq/L (ref 19–32)
Calcium: 9 mg/dL (ref 8.4–10.5)
Chloride: 105 mEq/L (ref 96–112)
Creatinine, Ser: 0.83 mg/dL (ref 0.40–1.20)
GFR: 103.51 mL/min (ref >60.00)
Glucose, Bld: 86 mg/dL (ref 70–99)
Potassium: 3.6 mEq/L (ref 3.5–5.1)
Sodium: 138 mEq/L (ref 135–145)
Total Bilirubin: 0.3 mg/dL (ref 0.2–1.2)
Total Protein: 7 g/dL (ref 6.0–8.3)

## 2019-06-05 LAB — CBC WITH DIFFERENTIAL/PLATELET
Basophils Absolute: 0 10*3/uL (ref 0.0–0.1)
Basophils Relative: 0.4 % (ref 0.0–3.0)
Eosinophils Absolute: 0 10*3/uL (ref 0.0–0.7)
Eosinophils Relative: 1.2 % (ref 0.0–5.0)
HCT: 38.6 % (ref 36.0–46.0)
Hemoglobin: 12.6 g/dL (ref 12.0–15.0)
Lymphocytes Relative: 45.2 % (ref 12.0–46.0)
Lymphs Abs: 1.7 10*3/uL (ref 0.7–4.0)
MCHC: 32.6 g/dL (ref 30.0–36.0)
MCV: 88.8 fl (ref 78.0–100.0)
Monocytes Absolute: 0.2 10*3/uL (ref 0.1–1.0)
Monocytes Relative: 5.2 % (ref 3.0–12.0)
Neutro Abs: 1.9 10*3/uL (ref 1.4–7.7)
Neutrophils Relative %: 48 % (ref 43.0–77.0)
Platelets: 235 10*3/uL (ref 150.0–400.0)
RBC: 4.34 Mil/uL (ref 3.87–5.11)
RDW: 14.8 % (ref 11.5–15.5)
WBC: 3.9 10*3/uL — ABNORMAL LOW (ref 4.0–10.5)

## 2019-06-05 LAB — LIPASE: Lipase: 107 U/L — ABNORMAL HIGH (ref 11.0–59.0)

## 2019-06-05 MED ORDER — SUMATRIPTAN SUCCINATE 50 MG PO TABS: ORAL_TABLET | ORAL | 0 refills | Status: DC

## 2019-06-05 MED FILL — SUMAtriptan SUCCINATE 50 MG: 50 | 30 days supply | Qty: 10 | Fill #0

## 2019-06-05 NOTE — Patient Instructions (Signed)
You can take the sumatriptan tablet as needed for bad headache/migraine.   Start by taking 1/2 tablet at migraine onset, may repeat with 1/2 or 1 tablet in 2 hours if migraine/headache persists. Do not exceed 2 tablets in 24 hours.  Stop by the lab prior to leaving today. I will notify you of your results once received.   Ensure you are consuming 64 ounces of water daily.  Try to eat more than once daily as discussed.  Notify me if your symptoms reoccur and/or if your headaches become more frequent.  It was a pleasure to see you today!

## 2019-06-05 NOTE — Addendum Note (Signed)
Addended by: Ellamae Sia on: 06/05/2019 12:43 PM   Modules accepted: Orders

## 2019-06-05 NOTE — Progress Notes (Signed)
Subjective:    Patient ID: Stephanie [name], female    DOB: 02/26/1996, 23 y.o.   MRN: [id number]  HPI  Ms. Felan is a 23 year old female with a history of migraines, anxiety and depression, hidradenitis suppurativa, acute pancreatitis who presents today with a chief complaint of near syncope.   She endorses left frontal and parietal headache that began one week. Two days ago she was at a friend's house warming up some food, turned around to put her plate on the counter, suddenly felt dizzy and fell to the ground. She had visual changes, couldn't hear anything for a few seconds, couldn't open her eyes, became drenched in sweat. She did not lose consciousness, but endorses that she couldn't talk or keep her eyes open, felt that her heart was racing. She checked her heart rate on her fit bit which was 54, normally runs 76-96. She tried to sit up and eat something which caused her symptoms to return. She then sat up again and slowly sipped on Gatorade with gradual improvement.   She is currently prescribed Doxycycline 100 mg which was initiated on 06/02/19 for treatment of hidradenitis for which she has not yet started. She has a history of migraines, this doesn't feel like a typical migraine but endorses a week long headache. She endorses plenty of hydration and had eaten a snack that day. She typically eats one meal daily. She denies changes in medications or food. Her headache resolved today.   Headaches occur once weekly which will last for 8 hours on average. She will have severe headaches once monthly. She takes Ibuprofen and Tylenol for headaches with some improvement, sometimes not. During headaches she will experience neck stiffness, photophobia. She evaluated for headaches in her early teenage years and was told to stop eating chocolate and drinking caffeine. She's never been on prescription preventative or abortive treatment for headaches.  Review of Systems  Constitutional: Negative  for fever.  Eyes: Positive for photophobia.  Respiratory: Negative for shortness of breath.   Cardiovascular: Positive for palpitations. Negative for chest pain.  Neurological: Positive for dizziness and headaches.  Psychiatric/Behavioral: The patient is not nervous/anxious.        Past Medical History:  Diagnosis Date  . Anemia   . Anxiety 01/28/2013   no meds  . Depression    no meds  . IBS (irritable bowel syndrome)   . Migraines    HA every other day - otc med prn  . Recurrent streptococcal tonsillitis   . Seasonal allergies    Seasonal environmental allergies  . Vision abnormalities    Myopia; patient has history of devloping ulcers when she wore contacts.     Social History   Socioeconomic History  . Marital status: Single    Spouse name: Not on file  . Number of children: Not on file  . Years of education: Not on file  . Highest education level: Not on file  Occupational History  . Not on file  Social Needs  . Financial resource strain: Not on file  . Food insecurity    Worry: Not on file    Inability: Not on file  . Transportation needs    Medical: Not on file    Non-medical: Not on file  Tobacco Use  . Smoking status: Never Smoker  . Smokeless tobacco: Never Used  Substance and Sexual Activity  . Alcohol use: Never    Frequency: Never    Comment: socially  . Drug use: Never  Comment: Last use Thursday 10/16/18  . Sexual activity: Not Currently    Partners: Male    Birth control/protection: Implant    Comment: broke up with first sexual partner 1 month ago  Lifestyle  . Physical activity    Days per week: Not on file    Minutes per session: Not on file  . Stress: Not on file  Relationships  . Social Musicianconnections    Talks on phone: Not on file    Gets together: Not on file    Attends religious service: Not on file    Active member of club or organization: Not on file    Attends meetings of clubs or organizations: Not on file    Relationship  status: Not on file  . Intimate partner violence    Fear of current or ex partner: Not on file    Emotionally abused: Not on file    Physically abused: Not on file    Forced sexual activity: Not on file  Other Topics Concern  . Not on file  Social History Narrative   Single.    Student at [employer].   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    Enjoys listening to music, hanging out with friends.     Past Surgical History:  Procedure Laterality Date  . COLONOSCOPY     IBS  . COLONOSCOPY     IBS  . LAPAROSCOPY N/A 10/20/2018   Procedure: LAPAROSCOPY DIAGNOSTIC possible removal of endometriosis;  Surgeon: Mitchel HonourMorris, Megan, DO;  Location: [facility];  Service: Gynecology;  Laterality: N/A;    Family History  Problem Relation Age of Onset  . Depression Mother   . Migraines Mother   . Migraines Father     Allergies  Allergen Reactions  . Coconut Flavor     Mouth itches, but no impaired breathing (per patient  . Septra [Sulfamethoxazole-Trimethoprim] Nausea Only and Other (See Comments)    Patient took a few courses of this and it ended up being stopped by a MD because it had started causing "bad stomach pain" (per the patient)    Current Outpatient Medications on File Prior to Visit  Medication Sig Dispense Refill  . doxycycline (VIBRA-TABS) 100 MG tablet Take 1 tablet (100 mg total) by mouth 2 (two) times daily for 14 days. 28 tablet 0  . etonogestrel (NEXPLANON) 68 MG IMPL implant 1 each by Subdermal route once.    Marland Kitchen. acetaminophen (TYLENOL) 500 MG tablet Take 1,500 mg by mouth every 6 (six) hours as needed for mild pain.    Marland Kitchen. ibuprofen (ADVIL,MOTRIN) 800 MG tablet Take 1 tablet (800 mg total) by mouth every 8 (eight) hours as needed for moderate pain. (Patient not taking: Reported on 06/05/2019) 30 tablet 0   No current facility-administered medications on file prior to visit.     BP 120/82   Pulse (!) 105   Temp 98 F (36.7 C) (Temporal)   Ht 5\' 6"  (1.676 m)   Wt 122  lb 8 oz (55.6 kg)   LMP 05/03/2019   SpO2 98%   BMI 19.77 kg/m    Objective:   Physical Exam  Constitutional: She is oriented to person, place, and time. She appears well-nourished.  Eyes: EOM are normal.  Neck: Neck supple.  Cardiovascular: Normal rate and regular rhythm.  Respiratory: Effort normal and breath sounds normal.  Neurological: She is alert and oriented to person, place, and time. No cranial nerve deficit.  Skin: Skin is warm and dry.  Psychiatric: She has a normal mood and affect.           Assessment & Plan:

## 2019-06-05 NOTE — Assessment & Plan Note (Signed)
Migraines seem to be occurring once monthly on average, weekly headaches. Recent symptoms could be secondary to migraine, will also rule out cardiac cause.   Discussed options for treatment including abortive medication and daily preventative medication. She will think about preventative medication, does agree to abortive therapy.  Rx for sumatriptan 50 mg sent to pharmacy. Discussed to start with half tablet at first. She will update.

## 2019-06-05 NOTE — Assessment & Plan Note (Signed)
Unclear etiology. Differential diagnoses include migraine, dehydration, electrolyte imbalance, anemia, thyroid disorder, etc.   ECG today with NSR with rate of 83. No t-wave inversion, PAC/PVC, ST changes. Appears similar to ECG from 2015.   Check labs today including CBC, BMP, TSH. Encouraged proper hydration with water, eat three meals daily, or snacks if Stephanie [name] skips meals. Await results.

## 2019-06-08 ENCOUNTER — Other Ambulatory Visit (INDEPENDENT_AMBULATORY_CARE_PROVIDER_SITE_OTHER): Payer: No Typology Code available for payment source

## 2019-06-08 DIAGNOSIS — R55 Syncope and collapse: Secondary | ICD-10-CM | POA: Diagnosis not present

## 2019-06-08 LAB — TSH: TSH: 0.54 u[IU]/mL (ref 0.35–4.50)

## 2019-06-19 ENCOUNTER — Emergency Department (HOSPITAL_COMMUNITY): Payer: No Typology Code available for payment source

## 2019-06-19 ENCOUNTER — Other Ambulatory Visit: Payer: Self-pay

## 2019-06-19 ENCOUNTER — Emergency Department (HOSPITAL_COMMUNITY)
Admission: EM | Admit: 2019-06-19 | Discharge: 2019-06-19 | Disposition: A | Discharge status: Home / Self Care | Payer: No Typology Code available for payment source | Attending: Emergency Medicine | Admitting: Emergency Medicine

## 2019-06-19 DIAGNOSIS — R1031 Right lower quadrant pain: Secondary | ICD-10-CM | POA: Diagnosis present

## 2019-06-19 DIAGNOSIS — R109 Unspecified abdominal pain: Secondary | ICD-10-CM

## 2019-06-19 DIAGNOSIS — Z20828 Contact with and (suspected) exposure to other viral communicable diseases: Secondary | ICD-10-CM | POA: Insufficient documentation

## 2019-06-19 DIAGNOSIS — R112 Nausea with vomiting, unspecified: Secondary | ICD-10-CM | POA: Diagnosis not present

## 2019-06-19 LAB — CBC WITH DIFFERENTIAL/PLATELET
Abs Immature Granulocytes: 0.01 10*3/uL (ref 0.00–0.07)
Basophils Absolute: 0 10*3/uL (ref 0.0–0.1)
Basophils Relative: 1 %
Eosinophils Absolute: 0 10*3/uL (ref 0.0–0.5)
Eosinophils Relative: 1 %
HCT: 41.4 % (ref 36.0–46.0)
Hemoglobin: 13.8 g/dL (ref 12.0–15.0)
Immature Granulocytes: 0 %
Lymphocytes Relative: 13 %
Lymphs Abs: 0.7 10*3/uL (ref 0.7–4.0)
MCH: 29.4 pg (ref 26.0–34.0)
MCHC: 33.3 g/dL (ref 30.0–36.0)
MCV: 88.1 fL (ref 80.0–100.0)
Monocytes Absolute: 0.1 10*3/uL (ref 0.1–1.0)
Monocytes Relative: 2 %
Neutro Abs: 4.5 10*3/uL (ref 1.7–7.7)
Neutrophils Relative %: 83 %
Platelets: 142 10*3/uL — ABNORMAL LOW (ref 150–400)
RBC: 4.7 MIL/uL (ref 3.87–5.11)
RDW: 13.2 % (ref 11.5–15.5)
WBC: 5.4 10*3/uL (ref 4.0–10.5)
nRBC: 0 % (ref 0.0–0.2)

## 2019-06-19 LAB — COMPREHENSIVE METABOLIC PANEL
ALT: 18 U/L (ref 0–44)
AST: 25 U/L (ref 15–41)
Albumin: 4.3 g/dL (ref 3.5–5.0)
Alkaline Phosphatase: 37 U/L — ABNORMAL LOW (ref 38–126)
Anion gap: 10 (ref 5–15)
BUN: 8 mg/dL (ref 6–20)
CO2: 21 mmol/L — ABNORMAL LOW (ref 22–32)
Calcium: 9.3 mg/dL (ref 8.9–10.3)
Chloride: 108 mmol/L (ref 98–111)
Creatinine, Ser: 0.84 mg/dL (ref 0.44–1.00)
GFR calc Af Amer: >60 mL/min (ref >60)
GFR calc non Af Amer: >60 mL/min (ref >60)
Glucose, Bld: 102 mg/dL — ABNORMAL HIGH (ref 70–99)
Potassium: 3.7 mmol/L (ref 3.5–5.1)
Sodium: 139 mmol/L (ref 135–145)
Total Bilirubin: 0.8 mg/dL (ref 0.3–1.2)
Total Protein: 7.4 g/dL (ref 6.5–8.1)

## 2019-06-19 LAB — URINALYSIS, ROUTINE W REFLEX MICROSCOPIC
Bilirubin Urine: NEGATIVE
Glucose, UA: NEGATIVE mg/dL
Hgb urine dipstick: NEGATIVE
Ketones, ur: 20 mg/dL — AB
Leukocytes,Ua: NEGATIVE
Nitrite: NEGATIVE
Protein, ur: NEGATIVE mg/dL
Specific Gravity, Urine: >1.046 — ABNORMAL HIGH (ref 1.005–1.030)
pH: 6 (ref 5.0–8.0)

## 2019-06-19 LAB — LIPASE, BLOOD: Lipase: 18 U/L (ref 11–51)

## 2019-06-19 LAB — I-STAT BETA HCG BLOOD, ED (MC, WL, AP ONLY): I-stat hCG, quantitative: <5 m[IU]/mL (ref <5)

## 2019-06-19 MED ORDER — IOHEXOL 300 MG/ML  SOLN
100.0000 mL | Freq: Once | INTRAMUSCULAR | Status: AC | PRN
  Administered 2019-06-19: 100 mL via INTRAVENOUS

## 2019-06-19 MED ORDER — MORPHINE SULFATE (PF) 4 MG/ML IV SOLN
4.0000 mg | Freq: Once | INTRAVENOUS | Status: AC
  Administered 2019-06-19: 4 mg via INTRAVENOUS
  Filled 2019-06-19: qty 1

## 2019-06-19 MED ORDER — ONDANSETRON HCL 4 MG/2ML IJ SOLN
4.0000 mg | Freq: Once | INTRAMUSCULAR | Status: AC
  Administered 2019-06-19: 4 mg via INTRAVENOUS
  Filled 2019-06-19: qty 2

## 2019-06-19 MED ORDER — METOCLOPRAMIDE HCL 10 MG PO TABS: 10.0000 mg | ORAL_TABLET | Freq: Four times a day (QID) | ORAL | 0 refills | Status: DC | PRN

## 2019-06-19 MED ORDER — SODIUM CHLORIDE 0.9 % IV BOLUS
1000.0000 mL | Freq: Once | INTRAVENOUS | Status: AC
  Administered 2019-06-19: 1000 mL via INTRAVENOUS

## 2019-06-19 NOTE — ED Triage Notes (Signed)
Pt reports N/V/D that started this morning. Reports she felt fine last night but woke up nauseated this morning. Pt reports she has thrown up 8 times in the last 24 hours and has had 3 episodes of diarrhea. Pt also reports abdominal cramping but reports she is on her period at present time, so cramps aren't abnormal for her.

## 2019-06-19 NOTE — ED Provider Notes (Signed)
  Physical Exam  BP 120/80   Pulse 89   Temp 98.3 F (36.8 C)   Resp 18   Ht 5\' 2"  (1.575 m)   Wt 56.2 kg   SpO2 100%   BMI 22.68 kg/m   Physical Exam   Gen: appears nontoxic Abd: diffuse ttp, worse in lower abd bilaterally.  soft without rigidity, guarding, distention.   ED Course/Procedures     Procedures  MDM   Patient signed out to me by B Athena Masse, PA-C.  Please see previous notes for further history.  In brief, patient presenting for evaluation of abdominal pain, nausea, vomiting which began today.  Labs overall have been reassuring.  CT pending.  Urine pending.  CT negative for acute findings.  On reassessment, patient reports she is feeling much better.  No pain unless actively palpating her abdomen.  No further nausea or vomiting.  Discussed option of pelvic ultrasound, as she is having lower abdominal pain, although as her pain is bilateral lower suspicion for torsion or TOA.  Patient had previously refused a pelvic exam.  Pt decided not to get an ultrasound today, and will return if symptoms worsen.  Urine negative for infection.  Pt requesting a different antiemetic for home, as her Zofran did not help.  Will give Reglan.  Encourage hydration.  Follow-up with primary care or in the ER for further evaluation if symptoms not improved.  At this time, patient appears safe for discharge.  Return precautions given.  Patient states she understands and agrees to plan.        Franchot Heidelberg, PA-C 06/19/19 2128    Jola Schmidt, MD 06/19/19 2141

## 2019-06-19 NOTE — Discharge Instructions (Signed)
Make sure you are staying well-hydrated water. Use Zofran and/or Reglan as needed for nausea or vomiting. Use Tylenol and ibuprofen as needed for pain. Follow-up with your primary care doctor if your symptoms persist. Return to the emergency room if you develop severe worsening abdominal pain, persistent vomiting, difficulty peeing, or any new, worsening, or concerning symptoms.

## 2019-06-19 NOTE — ED Provider Notes (Signed)
[facility] EMERGENCY DEPARTMENT Provider Note   CSN: [id number] Arrival date & time: 06/19/19  1647     History   Chief Complaint Chief Complaint  Patient presents with  . Nausea  . Emesis    HPI Stephanie [name] is a 23 y.o. female with history of pancreatitis, migraines, menorrhagia, anxiety presents today for abdominal pain, nausea vomiting and diarrhea.  Patient reports sudden onset right side abdominal pain with nonbloody/nonbilious emesis and nonbloody diarrhea greater than 8 episodes of vomiting with approximately 3 episodes of diarrhea prior to arrival.  She reports that her right lower abdominal pain is a constant aching throb moderate intensity worsened with palpation and slightly improved with rest, nonradiating.  She reports that her upper abdominal pain is only with her vomiting but when she does vomit it is a moderate to severe in intensity only improved with cessation of vomiting, aching throb without radiation.  No medications prior to arrival.  Patient reports history of menorrhagia and began menstrual cycle yesterday but reports that this feels much worse than her normal menstrual cramps.  Denies fever/chills, has not measured her temperature at home, denies headache, neck stiffness, dysuria/hematuria, concern for STD or any additional concerns.    HPI  Past Medical History:  Diagnosis Date  . Acute pancreatitis without infection or necrosis 04/21/2019  . Anemia   . Anxiety 01/28/2013   no meds  . Depression    no meds  . IBS (irritable bowel syndrome)   . Migraines    HA every other day - otc med prn  . Recurrent streptococcal tonsillitis   . Seasonal allergies    Seasonal environmental allergies  . Sebaceous cyst 08/04/2018   L mid back   . Vision abnormalities    Myopia; patient has history of devloping ulcers when she wore contacts.    Patient Active Problem List   Diagnosis Date Noted  . Near syncope 06/05/2019  . Axillary  hidradenitis suppurativa 03/05/2019  . Recurrent infection of skin 02/04/2019  . Preventative health care 11/20/2017  . Menorrhagia with regular cycle 09/24/2014  . Migraines 08/04/2014  . Allergic rhinitis 04/03/2013  . MDD (major depressive disorder), single episode, severe (H[facility]) 01/28/2013    Class: Chronic  . Generalized anxiety disorder 01/28/2013    Past Surgical History:  Procedure Laterality Date  . COLONOSCOPY     IBS  . COLONOSCOPY     IBS  . LAPAROSCOPY N/A 10/20/2018   Procedure: LAPAROSCOPY DIAGNOSTIC possible removal of endometriosis;  Surgeon: Mitchel HonourMorris, Megan, DO;  Location: [facility];  Service: Gynecology;  Laterality: N/A;     OB History    Gravida  0   Para  0   Term  0   Preterm  0   AB  0   Living  0     SAB  0   TAB  0   Ectopic  0   Multiple  0   Live Births  0        Obstetric Comments  Menstrual age: [age]  Age 1st Pregnancy: N/A          Home Medications    Prior to Admission medications   Medication Sig Start Date End Date Taking? Authorizing Provider  acetaminophen (TYLENOL) 500 MG tablet Take 1,500 mg by mouth every 6 (six) hours as needed for mild pain.    [provider]  etonogestrel (NEXPLANON) 68 MG IMPL implant 1 each by Subdermal route once.    [provider]  ibuprofen (ADVIL,MOTRIN) 800 MG tablet Take 1 tablet (800 mg total) by mouth every 8 (eight) hours as needed for moderate pain. Patient not taking: Reported on 06/05/2019 02/04/19   Pleas Koch, NP  SUMAtriptan (IMITREX) 50 MG tablet Take 1/2 to 1 tablet by mouth at migraine onset, may repeat in 2 hours if headache persists or recurs. Do not exceed 2 tablets in 24 hours. 06/05/19   Pleas Koch, NP    Family History Family History  Problem Relation Age of Onset  . Depression Mother   . Migraines Mother   . Migraines Father     Social History Social History   Tobacco Use  . Smoking status: Never Smoker  . Smokeless tobacco:  Never Used  Substance Use Topics  . Alcohol use: Never    Frequency: Never    Comment: socially  . Drug use: Never    Comment: Last use Thursday 10/16/18     Allergies   Coconut flavor and Septra [sulfamethoxazole-trimethoprim]   Review of Systems Review of Systems Ten systems are reviewed and are negative for acute change except as noted in the HPI  Physical Exam Updated Vital Signs BP 130/85 (BP Location: Right Arm)   Pulse (!) 101   Temp 98.5 F (36.9 C) (Oral)   Resp 20   Ht 5\' 2"  (1.575 m)   Wt 56.2 kg   SpO2 100%   BMI 22.68 kg/m   Physical Exam Constitutional:      General: She is not in acute distress.    Appearance: Normal appearance. She is well-developed. She is not ill-appearing or diaphoretic.  HENT:     Head: Normocephalic and atraumatic.     Right Ear: External ear normal.     Left Ear: External ear normal.     Nose: Nose normal.  Eyes:     General: Vision grossly intact. Gaze aligned appropriately.     Pupils: Pupils are equal, round, and reactive to light.  Neck:     Musculoskeletal: Normal range of motion.     Trachea: Trachea and phonation normal. No tracheal deviation.  Pulmonary:     Effort: Pulmonary effort is normal. No respiratory distress.  Abdominal:     General: There is no distension.     Palpations: Abdomen is soft.     Tenderness: There is abdominal tenderness in the right upper quadrant and right lower quadrant. There is no right CVA tenderness, left CVA tenderness, guarding or rebound. Positive signs include McBurney's sign. Negative signs include Murphy's sign.    Genitourinary:    Comments: Deferred by patient Musculoskeletal: Normal range of motion.  Skin:    General: Skin is warm and dry.  Neurological:     Mental Status: She is alert.     GCS: GCS eye subscore is 4. GCS verbal subscore is 5. GCS motor subscore is 6.     Comments: Speech is clear and goal oriented, follows commands Major Cranial nerves without deficit,  no facial droop Moves extremities without ataxia, coordination intact  Psychiatric:        Behavior: Behavior normal.    ED Treatments / Results  Labs (all labs ordered are listed, but only abnormal results are displayed) Labs Reviewed  CBC WITH DIFFERENTIAL/PLATELET  COMPREHENSIVE METABOLIC PANEL  LIPASE, BLOOD  URINALYSIS, ROUTINE W REFLEX MICROSCOPIC  I-STAT BETA HCG BLOOD, ED ([facility], [facility], [facility] ONLY)    EKG None  Radiology No results found.  Procedures Procedures (including critical  care time)  Medications Ordered in ED Medications  sodium chloride 0.9 % bolus 1,000 mL (has no administration in time range)     Initial Impression / Assessment and Plan / ED Course  I have reviewed the triage vital signs and the nursing notes.  Pertinent labs & imaging results that were available during my care of the patient were reviewed by me and considered in my medical decision making (see chart for details).    On arrival patient uncomfortable appearing but in no acute distress.  Heart regular rate and rhythm, lungs clear, abdomen soft with diffuse right-sided tenderness, positive Murphy's point tenderness, negative Murphy sign however does have some right upper quadrant tenderness as well.  Suspicion for appendicitis at this time will obtain blood work, CT abdomen pelvis, fluid bolus, pain and nausea control ordered.  COVID swab ordered in case patient needs emergent procedure. - Beta-hCG negative - CBC, CMP, urinalysis, lipase and CT abdomen pelvis are all pending.  Care handoff given to Sophia Caccavale PA-C at shift change, plan of care is to follow-up on pending results. Pending unremarkable CTAP consider US. Patient deferred pelvic examination.   Note: Portions of this report may have been transcribed using voice recognition software. Every effort was made to ensure accuracy; however, inadvertent computerized transcription errors may still be present. Final Clinical Impressions(s)  / ED Diagnoses   Final diagnoses:  None    ED Discharge Orders    None       Elizabeth PalauMorelli, Shrey Boike A, PA-C 06/19/19 1853    Cathren LaineSteinl, Kevin, MD 06/19/19 2107

## 2019-06-20 LAB — SARS CORONAVIRUS 2 (TAT 6-24 HRS): SARS Coronavirus 2: NEGATIVE

## 2019-06-22 ENCOUNTER — Encounter: Payer: No Typology Code available for payment source | Admitting: Primary Care

## 2019-06-23 NOTE — Progress Notes (Signed)
Patient cancelled appointment.

## 2019-07-10 ENCOUNTER — Encounter: Payer: Self-pay | Admitting: Primary Care

## 2019-07-10 ENCOUNTER — Ambulatory Visit (INDEPENDENT_AMBULATORY_CARE_PROVIDER_SITE_OTHER): Payer: No Typology Code available for payment source | Admitting: Primary Care

## 2019-07-10 VITALS — Wt 122.5 lb

## 2019-07-10 DIAGNOSIS — R109 Unspecified abdominal pain: Secondary | ICD-10-CM

## 2019-07-10 LAB — POC URINALSYSI DIPSTICK (AUTOMATED)
Bilirubin, UA: NEGATIVE
Blood, UA: NEGATIVE
Glucose, UA: NEGATIVE
Leukocytes, UA: NEGATIVE
Nitrite, UA: NEGATIVE
Protein, UA: POSITIVE — AB
Spec Grav, UA: 1.02 (ref 1.010–1.025)
Urobilinogen, UA: 1 E.U./dL
pH, UA: 7 (ref 5.0–8.0)

## 2019-07-10 NOTE — Patient Instructions (Signed)
Come by the office today to provide a urine specimen and some blood work.  Continue to push intake of water.  You can take ibuprofen 400-600 mg every 8 hours as needed for pain.  It was a pleasure to see you today! Allie Bossier, NP-C

## 2019-07-10 NOTE — Assessment & Plan Note (Signed)
Acute left side and flank pain. No hematuria (known) or vaginal symptoms. Differentials include UTI, renal stone, MSK involvement, pyelonephritis. She appears stable, no distress. UA with culture pending. Check CBC to rule out kidney infection. Discussed use of Advil, stretching, water intake.

## 2019-07-10 NOTE — Addendum Note (Signed)
Addended by: Ellamae Sia on: 07/10/2019 03:26 PM   Modules accepted: Orders

## 2019-07-10 NOTE — Addendum Note (Signed)
Addended by: Ellamae Sia on: 07/10/2019 03:41 PM   Modules accepted: Orders

## 2019-07-10 NOTE — Progress Notes (Signed)
Subjective:    Patient ID: Stephanie [name], female    DOB: 03/10/1996, 23 y.o.   MRN: [id number]  HPI  Virtual Visit via Video Note  I connected with Stephanie [name] on 07/10/19 at  9:20 AM EDT by a video enabled telemedicine application and verified that I am speaking with the correct person using two identifiers.  Location: Patient: Home Provider: Office   I discussed the limitations of evaluation and management by telemedicine and the availability of in person appointments. The patient expressed understanding and agreed to proceed.  History of Present Illness:  [name] is a 23 year old female with a history of hidradenitis suppurativa, anxiety and depression, migraines who presents today with a chief complaint of left side pain.  Her pain is located to the left lateral side and left flank which began 8 days ago. Her pain is worse with moving in certain directions and when urinating with a full bladder. She denies hematuria, dysuria, vaginal discharge/itching. She's not taken anything OTC but has been drinking cranberry juice and water as her aunt told her that she may have a UTI.   Observations/Objective:  Alert and oriented. Appears well, not sickly. No distress. Speaking in complete sentences. Normal ROM to trunk and back.  Assessment and Plan:  Acute left side and flank pain. No hematuria (known) or vaginal symptoms. Differentials include UTI, renal stone, MSK involvement, pyelonephritis. She appears stable, no distress. UA with culture pending. Check CBC to rule out kidney infection. Discussed use of Advil, stretching, water intake.   Follow Up Instructions:  Come by the office today to provide a urine specimen and some blood work.  Continue to push intake of water.  You can take ibuprofen 400-600 mg every 8 hours as needed for pain.  It was a pleasure to see you today! Stephanie ReelKate Sigmond Patalano, NP-C    I discussed the assessment and treatment plan with the  patient. The patient was provided an opportunity to ask questions and all were answered. The patient agreed with the plan and demonstrated an understanding of the instructions.   The patient was advised to call back or seek an in-person evaluation if the symptoms worsen or if the condition fails to improve as anticipated.     Stephanie NestKatherine K Elveta Rape, NP    Review of Systems  Constitutional: Negative for fever.  Gastrointestinal: Negative for nausea.  Genitourinary: Positive for flank pain. Negative for dysuria, frequency, pelvic pain and vaginal discharge.  Musculoskeletal: Positive for back pain.       Past Medical History:  Diagnosis Date  . Acute pancreatitis without infection or necrosis 04/21/2019  . Anemia   . Anxiety 01/28/2013   no meds  . Depression    no meds  . IBS (irritable bowel syndrome)   . Migraines    HA every other day - otc med prn  . Recurrent streptococcal tonsillitis   . Seasonal allergies    Seasonal environmental allergies  . Sebaceous cyst 08/04/2018   L mid back   . Vision abnormalities    Myopia; patient has history of devloping ulcers when she wore contacts.     Social History   Socioeconomic History  . Marital status: Single    Spouse name: Not on file  . Number of children: Not on file  . Years of education: Not on file  . Highest education level: Not on file  Occupational History  . Not on file  Social Needs  . Financial resource strain: Not  on file  . Food insecurity    Worry: Not on file    Inability: Not on file  . Transportation needs    Medical: Not on file    Non-medical: Not on file  Tobacco Use  . Smoking status: Never Smoker  . Smokeless tobacco: Never Used  Substance and Sexual Activity  . Alcohol use: Never    Frequency: Never    Comment: socially  . Drug use: Never    Comment: Last use Thursday 10/16/18  . Sexual activity: Not Currently    Partners: Male    Birth control/protection: Implant    Comment: broke up  with first sexual partner 1 month ago  Lifestyle  . Physical activity    Days per week: Not on file    Minutes per session: Not on file  . Stress: Not on file  Relationships  . Social Musicianconnections    Talks on phone: Not on file    Gets together: Not on file    Attends religious service: Not on file    Active member of club or organization: Not on file    Attends meetings of clubs or organizations: Not on file    Relationship status: Not on file  . Intimate partner violence    Fear of current or ex partner: Not on file    Emotionally abused: Not on file    Physically abused: Not on file    Forced sexual activity: Not on file  Other Topics Concern  . Not on file  Social History Narrative   Single.    Student [employer].   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    Enjoys listening to music, hanging out with friends.     Past Surgical History:  Procedure Laterality Date  . COLONOSCOPY     IBS  . COLONOSCOPY     IBS  . LAPAROSCOPY N/A 10/20/2018   Procedure: LAPAROSCOPY DIAGNOSTIC possible removal of endometriosis;  Surgeon: Mitchel HonourMorris, Megan, DO;  Location: [facility];  Service: Gynecology;  Laterality: N/A;    Family History  Problem Relation Age of Onset  . Depression Mother   . Migraines Mother   . Migraines Father     Allergies  Allergen Reactions  . Coconut Flavor     Mouth itches, but no impaired breathing (per patient  . Septra [Sulfamethoxazole-Trimethoprim] Nausea Only and Other (See Comments)    Patient took a few courses of this and it ended up being stopped by a MD because it had started causing "bad stomach pain" (per the patient)    Current Outpatient Medications on File Prior to Visit  Medication Sig Dispense Refill  . acetaminophen (TYLENOL) 500 MG tablet Take 1,500 mg by mouth every 6 (six) hours as needed for mild pain.    Marland Kitchen. etonogestrel (NEXPLANON) 68 MG IMPL implant 1 each by Subdermal route once.    . metoCLOPramide (REGLAN) 10 MG tablet Take 1  tablet (10 mg total) by mouth every 6 (six) hours as needed for nausea or vomiting. 30 tablet 0  . SUMAtriptan (IMITREX) 50 MG tablet Take 1/2 to 1 tablet by mouth at migraine onset, may repeat in 2 hours if headache persists or recurs. Do not exceed 2 tablets in 24 hours. 10 tablet 0  . ibuprofen (ADVIL,MOTRIN) 800 MG tablet Take 1 tablet (800 mg total) by mouth every 8 (eight) hours as needed for moderate pain. (Patient not taking: Reported on 06/05/2019) 30 tablet 0   No current facility-administered  medications on file prior to visit.     Wt 122 lb 8 oz (55.6 kg)   LMP 06/19/2019   BMI 22.41 kg/m    Objective:   Physical Exam  Constitutional: She is oriented to person, place, and time. She appears well-nourished.  Respiratory: Effort normal.  Musculoskeletal: Normal range of motion.  Neurological: She is alert and oriented to person, place, and time.  Psychiatric: She has a normal mood and affect.           Assessment & Plan:

## 2019-07-11 LAB — CBC WITH DIFFERENTIAL/PLATELET
Absolute Monocytes: 441 cells/uL (ref 200–950)
Basophils Absolute: 17 cells/uL (ref 0–200)
Basophils Relative: 0.3 %
Eosinophils Absolute: 41 cells/uL (ref 15–500)
Eosinophils Relative: 0.7 %
HCT: 40.2 % (ref 35.0–45.0)
Hemoglobin: 13.2 g/dL (ref 11.7–15.5)
Lymphs Abs: 2140 cells/uL (ref 850–3900)
MCH: 29.1 pg (ref 27.0–33.0)
MCHC: 32.8 g/dL (ref 32.0–36.0)
MCV: 88.7 fL (ref 80.0–100.0)
MPV: 10.6 fL (ref 7.5–12.5)
Monocytes Relative: 7.6 %
Neutro Abs: 3161 cells/uL (ref 1500–7800)
Neutrophils Relative %: 54.5 %
Platelets: 260 10*3/uL (ref 140–400)
RBC: 4.53 10*6/uL (ref 3.80–5.10)
RDW: 13.4 % (ref 11.0–15.0)
Total Lymphocyte: 36.9 %
WBC: 5.8 10*3/uL (ref 3.8–10.8)

## 2019-07-11 LAB — URINE CULTURE
MICRO NUMBER:: 798073
SPECIMEN QUALITY:: ADEQUATE

## 2019-07-13 ENCOUNTER — Ambulatory Visit: Payer: No Typology Code available for payment source | Admitting: Primary Care

## 2019-08-19 ENCOUNTER — Telehealth: Payer: Self-pay | Admitting: *Deleted

## 2019-08-19 DIAGNOSIS — N920 Excessive and frequent menstruation with regular cycle: Secondary | ICD-10-CM

## 2019-08-19 NOTE — Telephone Encounter (Signed)
Patient called stating that she needs a referral to her GYN at [facility]. Patient stated that she does not remember her name. Patient stated that she has insurance thru [facility] and the plan requires a referral. Patient stated the last few months when she has her period she gets weak, vomits, light headedness and back pain. Patient stated because of these symptoms she ends up in the ER having to get IV fluids because she gets dehydrated. Patient stated that she has now been on her cycle for a couple of days and the symptoms are coming back. Patient requested a call back because she is hoping to get in with her GYN this week.

## 2019-08-19 NOTE — Telephone Encounter (Signed)
Spoken and notified patient of Kate Clark's comments. Patient verbalized understanding.  

## 2019-08-19 NOTE — Telephone Encounter (Signed)
Please notify patient that the referral was placed and that she should call her GYN office notifying them that a referral was placed from our end.

## 2019-09-19 ENCOUNTER — Emergency Department (HOSPITAL_COMMUNITY)
Admission: EM | Admit: 2019-09-19 | Discharge: 2019-09-19 | Disposition: A | Discharge status: Home / Self Care | Payer: No Typology Code available for payment source | Attending: Emergency Medicine | Admitting: Emergency Medicine

## 2019-09-19 ENCOUNTER — Other Ambulatory Visit: Payer: Self-pay

## 2019-09-19 ENCOUNTER — Encounter (HOSPITAL_COMMUNITY): Payer: Self-pay | Admitting: Emergency Medicine

## 2019-09-19 DIAGNOSIS — K529 Noninfective gastroenteritis and colitis, unspecified: Secondary | ICD-10-CM | POA: Insufficient documentation

## 2019-09-19 DIAGNOSIS — R1110 Vomiting, unspecified: Secondary | ICD-10-CM | POA: Diagnosis present

## 2019-09-19 LAB — CBC
HCT: 42.3 % (ref 36.0–46.0)
Hemoglobin: 14 g/dL (ref 12.0–15.0)
MCH: 30 pg (ref 26.0–34.0)
MCHC: 33.1 g/dL (ref 30.0–36.0)
MCV: 90.6 fL (ref 80.0–100.0)
Platelets: 266 10*3/uL (ref 150–400)
RBC: 4.67 MIL/uL (ref 3.87–5.11)
RDW: 13.4 % (ref 11.5–15.5)
WBC: 7.9 10*3/uL (ref 4.0–10.5)
nRBC: 0 % (ref 0.0–0.2)

## 2019-09-19 LAB — COMPREHENSIVE METABOLIC PANEL
ALT: 15 U/L (ref 0–44)
AST: 20 U/L (ref 15–41)
Albumin: 4.6 g/dL (ref 3.5–5.0)
Alkaline Phosphatase: 38 U/L (ref 38–126)
Anion gap: 12 (ref 5–15)
BUN: 14 mg/dL (ref 6–20)
CO2: 19 mmol/L — ABNORMAL LOW (ref 22–32)
Calcium: 9.6 mg/dL (ref 8.9–10.3)
Chloride: 104 mmol/L (ref 98–111)
Creatinine, Ser: 0.86 mg/dL (ref 0.44–1.00)
GFR calc Af Amer: >60 mL/min (ref >60)
GFR calc non Af Amer: >60 mL/min (ref >60)
Glucose, Bld: 93 mg/dL (ref 70–99)
Potassium: 3.6 mmol/L (ref 3.5–5.1)
Sodium: 135 mmol/L (ref 135–145)
Total Bilirubin: 0.9 mg/dL (ref 0.3–1.2)
Total Protein: 8 g/dL (ref 6.5–8.1)

## 2019-09-19 LAB — LIPASE, BLOOD: Lipase: 14 U/L (ref 11–51)

## 2019-09-19 LAB — I-STAT BETA HCG BLOOD, ED (MC, WL, AP ONLY): I-stat hCG, quantitative: <5 m[IU]/mL (ref <5)

## 2019-09-19 MED ORDER — ONDANSETRON HCL 4 MG/2ML IJ SOLN
4.0000 mg | Freq: Once | INTRAMUSCULAR | Status: AC
  Administered 2019-09-19: 4 mg via INTRAVENOUS
  Filled 2019-09-19: qty 2

## 2019-09-19 MED ORDER — ONDANSETRON 4 MG PO TBDP: 4.0000 mg | ORAL_TABLET | Freq: Three times a day (TID) | ORAL | 0 refills | Status: DC | PRN

## 2019-09-19 MED ORDER — MORPHINE SULFATE (PF) 4 MG/ML IV SOLN
4.0000 mg | Freq: Once | INTRAVENOUS | Status: AC
  Administered 2019-09-19: 4 mg via INTRAVENOUS
  Filled 2019-09-19: qty 1

## 2019-09-19 MED ORDER — SODIUM CHLORIDE 0.9 % IV BOLUS
1000.0000 mL | Freq: Once | INTRAVENOUS | Status: AC
  Administered 2019-09-19: 1000 mL via INTRAVENOUS

## 2019-09-19 MED ORDER — SODIUM CHLORIDE 0.9% FLUSH
3.0000 mL | Freq: Once | INTRAVENOUS | Status: AC
  Administered 2019-09-19: 11:00:00 3 mL via INTRAVENOUS

## 2019-09-19 NOTE — ED Provider Notes (Signed)
[place] EMERGENCY DEPARTMENT Provider Note   CSN: [id number] Arrival date & time: 09/19/19  4315     History   Chief Complaint Chief Complaint  Patient presents with  . Emesis  . Abdominal Pain    HPI Stephanie [name] is a 23 y.o. female.     The history is provided by the patient and medical records. No language interpreter was used.  Emesis Associated symptoms: abdominal pain   Abdominal Pain Associated symptoms: vomiting      23 year old female with history of IBS, anxiety, anemia, presents ED for evaluation of abdominal pain.  Patient report for the past 2 to 3 days she has had recurrent pain to her left side of the abdomen.  She described pain as a sharp sensation radiates to her back, waxing waning.  Since yesterday she endorsed persistent nausea, has vomited more than 10 times of greenish emesis without blood.  She also endorsed 2 episodes of loose stools without blood or mucus.  She endorsed subjective fever.  She endorsed decrease in appetite and having been eating the past 2 days.  She tried taking Zofran this morning and saw without adequate relief.  She did admits to taking more amount of Tylenol several days prior for her abdominal cramping.  She is currently on her menstruation which has been regular.  She denies any chest pain shortness of breath productive cough, dysuria, vaginal discharge.  She report remote history of pancreatitis.  She denies alcohol abuse or tobacco.  Her abdominal pain is mild at this time, nausea is persistent.  She denies any recent travel or any exotic food.  No history of diabetes.    Past Medical History:  Diagnosis Date  . Acute pancreatitis without infection or necrosis 04/21/2019  . Anemia   . Anxiety 01/28/2013   no meds  . Depression    no meds  . IBS (irritable bowel syndrome)   . Migraines    HA every other day - otc med prn  . Recurrent streptococcal tonsillitis   . Seasonal allergies    Seasonal  environmental allergies  . Sebaceous cyst 08/04/2018   L mid back   . Vision abnormalities    Myopia; patient has history of devloping ulcers when she wore contacts.    Patient Active Problem List   Diagnosis Date Noted  . Flank pain 07/10/2019  . Near syncope 06/05/2019  . Axillary hidradenitis suppurativa 03/05/2019  . Recurrent infection of skin 02/04/2019  . Preventative health care 11/20/2017  . Menorrhagia with regular cycle 09/24/2014  . Migraines 08/04/2014  . Allergic rhinitis 04/03/2013  . MDD (major depressive disorder), single episode, severe ([facility]) 01/28/2013    Class: Chronic  . Generalized anxiety disorder 01/28/2013    Past Surgical History:  Procedure Laterality Date  . COLONOSCOPY     IBS  . COLONOSCOPY     IBS  . LAPAROSCOPY N/A 10/20/2018   Procedure: LAPAROSCOPY DIAGNOSTIC possible removal of endometriosis;  Surgeon: Linda Hedges, DO;  Location: [facility];  Service: Gynecology;  Laterality: N/A;     OB History    Gravida  0   Para  0   Term  0   Preterm  0   AB  0   Living  0     SAB  0   TAB  0   Ectopic  0   Multiple  0   Live Births  0        Obstetric Comments  Menstrual  age: 89  Age 1st Pregnancy: N/A          Home Medications    Prior to Admission medications   Medication Sig Start Date End Date Taking? Authorizing Provider  acetaminophen (TYLENOL) 500 MG tablet Take 1,500 mg by mouth every 6 (six) hours as needed for mild pain.    [provider]  etonogestrel (NEXPLANON) 68 MG IMPL implant 1 each by Subdermal route once.    [provider]  ibuprofen (ADVIL,MOTRIN) 800 MG tablet Take 1 tablet (800 mg total) by mouth every 8 (eight) hours as needed for moderate pain. Patient not taking: Reported on 06/05/2019 02/04/19   Doreene Nest, NP  metoCLOPramide (REGLAN) 10 MG tablet Take 1 tablet (10 mg total) by mouth every 6 (six) hours as needed for nausea or vomiting. 06/19/19   Caccavale, Sophia, PA-C   SUMAtriptan (IMITREX) 50 MG tablet Take 1/2 to 1 tablet by mouth at migraine onset, may repeat in 2 hours if headache persists or recurs. Do not exceed 2 tablets in 24 hours. 06/05/19   Doreene Nest, NP    Family History Family History  Problem Relation Age of Onset  . Depression Mother   . Migraines Mother   . Migraines Father     Social History Social History   Tobacco Use  . Smoking status: Never Smoker  . Smokeless tobacco: Never Used  Substance Use Topics  . Alcohol use: Never    Frequency: Never    Comment: socially  . Drug use: Yes    Types: Marijuana    Comment: Last use Thursday 10/16/18     Allergies   Coconut flavor and Septra [sulfamethoxazole-trimethoprim]   Review of Systems Review of Systems  Gastrointestinal: Positive for abdominal pain and vomiting.  All other systems reviewed and are negative.    Physical Exam Updated Vital Signs BP 130/81 (BP Location: Right Arm)   Pulse (!) 130   Temp 99.4 F (37.4 C) (Oral)   Resp 20   LMP 09/17/2019   SpO2 100%   Physical Exam Vitals signs and nursing note reviewed.  Constitutional:      General: She is not in acute distress.    Appearance: She is well-developed.  HENT:     Head: Atraumatic.  Eyes:     Conjunctiva/sclera: Conjunctivae normal.  Neck:     Musculoskeletal: Neck supple.  Cardiovascular:     Rate and Rhythm: Tachycardia present.  Pulmonary:     Effort: Pulmonary effort is normal.     Breath sounds: Normal breath sounds.  Abdominal:     General: Abdomen is flat.     Tenderness: There is abdominal tenderness in the left upper quadrant. There is no right CVA tenderness or left CVA tenderness. Negative signs include Murphy's sign, Rovsing's sign and McBurney's sign.     Hernia: No hernia is present.  Skin:    Findings: No rash.  Neurological:     Mental Status: She is alert.      ED Treatments / Results  Labs (all labs ordered are listed, but only abnormal results are  displayed) Labs Reviewed  LIPASE, BLOOD  COMPREHENSIVE METABOLIC PANEL  CBC  URINALYSIS, ROUTINE W REFLEX MICROSCOPIC  I-STAT BETA HCG BLOOD, ED ([facility], [facility], [facility] ONLY)    EKG None  Radiology No results found.  Procedures Procedures (including critical care time)  Medications Ordered in ED Medications  sodium chloride flush (NS) 0.9 % injection 3 mL (has no administration in time  range)     Initial Impression / Assessment and Plan / ED Course  I have reviewed the triage vital signs and the nursing notes.  Pertinent labs & imaging results that were available during my care of the patient were reviewed by me and considered in my medical decision making (see chart for details).        BP 113/78   Pulse (!) 101   Temp 99.4 F (37.4 C) (Oral)   Resp 18   LMP 09/17/2019   SpO2 100%    Final Clinical Impressions(s) / ED Diagnoses   Final diagnoses:  Gastroenteritis    ED Discharge Orders         Ordered    ondansetron (ZOFRAN ODT) 4 MG disintegrating tablet  Every 8 hours PRN     09/19/19 1147         10:33 AM Patient here with left upper quadrant abdominal pain as well as having persistent nausea vomiting and diarrhea.  Remote history of elevated lipase of unknown etiology.  She does not have any history of alcohol abuse, or history of diabetes.  She does not have significant right upper quadrant abdominal discomfort to suggest potential gallstone pancreatitis.  Abdomen is otherwise soft without any peritoneal sign.  She is otherwise well-appearing.  Vital signs stable.  No URI symptoms to suggest COVID-19.  Will provide symptomatic treatment, work-up initiated.   Fayrene Helperran, Bayan Kushnir, PA-C 09/23/19 Einar [id number]    Eber HongMiller, Brian, MD 09/23/19 [phone]

## 2019-09-19 NOTE — Discharge Instructions (Signed)
Your testing today was unremarkable No signs of infection or pancreatitis or gallbladder disease You are not pregnant  Zofran as needed for nausea, drink plenty of fluids, stay out of work today

## 2019-09-19 NOTE — ED Triage Notes (Signed)
C/o LLQ pain, nausea, vomiting, and diarrhea since yesterday.  Reports chills last night.

## 2019-09-19 NOTE — ED Provider Notes (Signed)
Minimal lower abdominal discomfort with nausea vomiting and diarrhea since yesterday, almost completely resolved with Zofran and 1 L of IV fluids, nontender abdomen on my exam.  Labs reviewed, unremarkable, not pregnant, no leukocytosis, vital signs normal, stable for discharge.  Medical screening examination/treatment/procedure(s) were conducted as a shared visit with non-physician practitioner(s) and myself.  I personally evaluated the patient during the encounter.  Clinical Impression:   Final diagnoses:  Gastroenteritis         Noemi Chapel, MD 09/22/19 1544

## 2019-09-20 ENCOUNTER — Emergency Department (HOSPITAL_COMMUNITY)
Admission: EM | Admit: 2019-09-20 | Discharge: 2019-09-20 | Disposition: A | Discharge status: Home / Self Care | Payer: No Typology Code available for payment source | Attending: Emergency Medicine | Admitting: Emergency Medicine

## 2019-09-20 ENCOUNTER — Emergency Department (HOSPITAL_COMMUNITY): Payer: No Typology Code available for payment source

## 2019-09-20 ENCOUNTER — Encounter (HOSPITAL_COMMUNITY): Payer: Self-pay | Admitting: Emergency Medicine

## 2019-09-20 ENCOUNTER — Other Ambulatory Visit: Payer: Self-pay

## 2019-09-20 DIAGNOSIS — Z20828 Contact with and (suspected) exposure to other viral communicable diseases: Secondary | ICD-10-CM | POA: Diagnosis not present

## 2019-09-20 DIAGNOSIS — Z79899 Other long term (current) drug therapy: Secondary | ICD-10-CM | POA: Insufficient documentation

## 2019-09-20 DIAGNOSIS — R1031 Right lower quadrant pain: Secondary | ICD-10-CM | POA: Diagnosis not present

## 2019-09-20 DIAGNOSIS — R197 Diarrhea, unspecified: Secondary | ICD-10-CM | POA: Diagnosis not present

## 2019-09-20 DIAGNOSIS — E860 Dehydration: Secondary | ICD-10-CM | POA: Insufficient documentation

## 2019-09-20 DIAGNOSIS — R509 Fever, unspecified: Secondary | ICD-10-CM | POA: Diagnosis not present

## 2019-09-20 DIAGNOSIS — R112 Nausea with vomiting, unspecified: Secondary | ICD-10-CM | POA: Insufficient documentation

## 2019-09-20 LAB — URINALYSIS, ROUTINE W REFLEX MICROSCOPIC
Bacteria, UA: NONE SEEN
Bilirubin Urine: NEGATIVE
Glucose, UA: NEGATIVE mg/dL
Ketones, ur: 80 mg/dL — AB
Leukocytes,Ua: NEGATIVE
Nitrite: NEGATIVE
Protein, ur: NEGATIVE mg/dL
Specific Gravity, Urine: >1.046 — ABNORMAL HIGH (ref 1.005–1.030)
pH: 6 (ref 5.0–8.0)

## 2019-09-20 LAB — COMPREHENSIVE METABOLIC PANEL
ALT: 14 U/L (ref 0–44)
AST: 20 U/L (ref 15–41)
Albumin: 4 g/dL (ref 3.5–5.0)
Alkaline Phosphatase: 32 U/L — ABNORMAL LOW (ref 38–126)
Anion gap: 12 (ref 5–15)
BUN: 13 mg/dL (ref 6–20)
CO2: 21 mmol/L — ABNORMAL LOW (ref 22–32)
Calcium: 9 mg/dL (ref 8.9–10.3)
Chloride: 104 mmol/L (ref 98–111)
Creatinine, Ser: 0.88 mg/dL (ref 0.44–1.00)
GFR calc Af Amer: >60 mL/min (ref >60)
GFR calc non Af Amer: >60 mL/min (ref >60)
Glucose, Bld: 85 mg/dL (ref 70–99)
Potassium: 3.6 mmol/L (ref 3.5–5.1)
Sodium: 137 mmol/L (ref 135–145)
Total Bilirubin: 0.9 mg/dL (ref 0.3–1.2)
Total Protein: 7 g/dL (ref 6.5–8.1)

## 2019-09-20 LAB — CBC WITH DIFFERENTIAL/PLATELET
Abs Immature Granulocytes: 0.01 10*3/uL (ref 0.00–0.07)
Basophils Absolute: 0 10*3/uL (ref 0.0–0.1)
Basophils Relative: 0 %
Eosinophils Absolute: 0 10*3/uL (ref 0.0–0.5)
Eosinophils Relative: 0 %
HCT: 39.2 % (ref 36.0–46.0)
Hemoglobin: 13.2 g/dL (ref 12.0–15.0)
Immature Granulocytes: 0 %
Lymphocytes Relative: 23 %
Lymphs Abs: 1.1 10*3/uL (ref 0.7–4.0)
MCH: 29.9 pg (ref 26.0–34.0)
MCHC: 33.7 g/dL (ref 30.0–36.0)
MCV: 88.7 fL (ref 80.0–100.0)
Monocytes Absolute: 0.3 10*3/uL (ref 0.1–1.0)
Monocytes Relative: 6 %
Neutro Abs: 3.4 10*3/uL (ref 1.7–7.7)
Neutrophils Relative %: 71 %
Platelets: 229 10*3/uL (ref 150–400)
RBC: 4.42 MIL/uL (ref 3.87–5.11)
RDW: 13.2 % (ref 11.5–15.5)
WBC: 4.7 10*3/uL (ref 4.0–10.5)
nRBC: 0 % (ref 0.0–0.2)

## 2019-09-20 LAB — LIPASE, BLOOD: Lipase: 30 U/L (ref 11–51)

## 2019-09-20 LAB — SARS CORONAVIRUS 2 (TAT 6-24 HRS): SARS Coronavirus 2: NEGATIVE

## 2019-09-20 MED ORDER — ONDANSETRON HCL 4 MG/2ML IJ SOLN
4.0000 mg | Freq: Once | INTRAMUSCULAR | Status: AC
  Administered 2019-09-20: 4 mg via INTRAVENOUS
  Filled 2019-09-20: qty 2

## 2019-09-20 MED ORDER — SODIUM CHLORIDE 0.9 % IV BOLUS
1000.0000 mL | Freq: Once | INTRAVENOUS | Status: AC
  Administered 2019-09-20: 1000 mL via INTRAVENOUS

## 2019-09-20 MED ORDER — IOHEXOL 300 MG/ML  SOLN
100.0000 mL | Freq: Once | INTRAMUSCULAR | Status: AC | PRN
  Administered 2019-09-20: 100 mL via INTRAVENOUS

## 2019-09-20 MED ORDER — MORPHINE SULFATE (PF) 4 MG/ML IV SOLN
4.0000 mg | Freq: Once | INTRAVENOUS | Status: AC
  Administered 2019-09-20: 4 mg via INTRAVENOUS
  Filled 2019-09-20: qty 1

## 2019-09-20 NOTE — ED Provider Notes (Signed)
[facility] EMERGENCY DEPARTMENT Provider Note   CSN: [id number] Arrival date & time: 09/20/19  7829     History   Chief Complaint Chief Complaint  Patient presents with  . Emesis    HPI Stephanie [name] is a 23 y.o. female.     The history is provided by the patient and medical records. No language interpreter was used.  Emesis    23 year old female with history of IBS, anxiety, depression presenting to the ED for abdominal pain.  This is a second ER visit within the past 2 days.  For the past 3 to 4 days patient has had recurrent left abdominal pain.  Pain is sharp, waxing waning radiates to her back.  She also endorsed persistent nausea, vomiting, as well as having loose stools.  Patient endorsed subjective fever no complaints of runny nose, sneezing coughing or dysuria.  Patient was seen by me yesterday for her symptoms.  Patient received symptomatic treatment and felt better.  Subsequently was discharged home.  She mention she then felt bad again, unable to keep anything down, actively vomiting and now endorsed worsening abdominal pain.  Pain is now located in her right lower quadrant, sharp, achy and soreness.  Rates pain is moderate in severity.  Past Medical History:  Diagnosis Date  . Acute pancreatitis without infection or necrosis 04/21/2019  . Anemia   . Anxiety 01/28/2013   no meds  . Depression    no meds  . IBS (irritable bowel syndrome)   . Migraines    HA every other day - otc med prn  . Recurrent streptococcal tonsillitis   . Seasonal allergies    Seasonal environmental allergies  . Sebaceous cyst 08/04/2018   L mid back   . Vision abnormalities    Myopia; patient has history of devloping ulcers when she wore contacts.    Patient Active Problem List   Diagnosis Date Noted  . Flank pain 07/10/2019  . Near syncope 06/05/2019  . Axillary hidradenitis suppurativa 03/05/2019  . Recurrent infection of skin 02/04/2019  . Preventative  health care 11/20/2017  . Menorrhagia with regular cycle 09/24/2014  . Migraines 08/04/2014  . Allergic rhinitis 04/03/2013  . MDD (major depressive disorder), single episode, severe (HCC) 01/28/2013    Class: Chronic  . Generalized anxiety disorder 01/28/2013    Past Surgical History:  Procedure Laterality Date  . COLONOSCOPY     IBS  . COLONOSCOPY     IBS  . LAPAROSCOPY N/A 10/20/2018   Procedure: LAPAROSCOPY DIAGNOSTIC possible removal of endometriosis;  Surgeon: Mitchel Honour, DO;  Location: [facility];  Service: Gynecology;  Laterality: N/A;     OB History    Gravida  0   Para  0   Term  0   Preterm  0   AB  0   Living  0     SAB  0   TAB  0   Ectopic  0   Multiple  0   Live Births  0        Obstetric Comments  Menstrual age: 48  Age 1st Pregnancy: N/A          Home Medications    Prior to Admission medications   Medication Sig Start Date End Date Taking? Authorizing Provider  acetaminophen (TYLENOL) 500 MG tablet Take 1,000 mg by mouth every 6 (six) hours as needed for mild pain.     [provider]  etonogestrel (NEXPLANON) 68 MG IMPL implant 1  each by Subdermal route once.    [provider]  ondansetron (ZOFRAN ODT) 4 MG disintegrating tablet Take 1 tablet (4 mg total) by mouth every 8 (eight) hours as needed for nausea. 09/19/19   Eber Hong, MD  metoCLOPramide (REGLAN) 10 MG tablet Take 1 tablet (10 mg total) by mouth every 6 (six) hours as needed for nausea or vomiting. Patient not taking: Reported on 09/19/2019 06/19/19 09/19/19  Alveria Apley, PA-C    Family History Family History  Problem Relation Age of Onset  . Depression Mother   . Migraines Mother   . Migraines Father     Social History Social History   Tobacco Use  . Smoking status: Never Smoker  . Smokeless tobacco: Never Used  Substance Use Topics  . Alcohol use: Never    Frequency: Never    Comment: socially  . Drug use: Yes    Types:  Marijuana    Comment: Last use Thursday 10/16/18     Allergies   Coconut flavor and Septra [sulfamethoxazole-trimethoprim]   Review of Systems Review of Systems  Gastrointestinal: Positive for vomiting.  All other systems reviewed and are negative.    Physical Exam Updated Vital Signs BP 125/79 (BP Location: Right Arm)   Pulse (!) 102   Temp 98.4 F (36.9 C) (Oral)   Resp 20   LMP 09/17/2019   SpO2 99%   Physical Exam Vitals signs and nursing note reviewed.  Constitutional:      General: She is not in acute distress.    Appearance: She is well-developed.  HENT:     Head: Atraumatic.  Eyes:     Conjunctiva/sclera: Conjunctivae normal.  Neck:     Musculoskeletal: Neck supple.  Cardiovascular:     Rate and Rhythm: Tachycardia present.     Pulses: Normal pulses.     Heart sounds: Normal heart sounds.  Pulmonary:     Effort: Pulmonary effort is normal.     Breath sounds: Normal breath sounds.  Abdominal:     Palpations: Abdomen is soft.     Tenderness: There is abdominal tenderness (Tenderness to right lower quadrant without guarding or rebound tenderness.).  Skin:    Findings: No rash.  Neurological:     Mental Status: She is alert.      ED Treatments / Results  Labs (all labs ordered are listed, but only abnormal results are displayed) Labs Reviewed  COMPREHENSIVE METABOLIC PANEL - Abnormal; Notable for the following components:      Result Value   CO2 21 (*)    Alkaline Phosphatase 32 (*)    All other components within normal limits  URINALYSIS, ROUTINE W REFLEX MICROSCOPIC - Abnormal; Notable for the following components:   Color, Urine STRAW (*)    Specific Gravity, Urine >1.046 (*)    Hgb urine dipstick MODERATE (*)    Ketones, ur 80 (*)    All other components within normal limits  SARS CORONAVIRUS 2 (TAT 6-24 HRS)  CBC WITH DIFFERENTIAL/PLATELET  LIPASE, BLOOD    EKG None  Radiology Ct Abdomen Pelvis W Contrast  Result Date:  09/20/2019 CLINICAL DATA:  Abdominal pain since last evening. EXAM: CT ABDOMEN AND PELVIS WITH CONTRAST TECHNIQUE: Multidetector CT imaging of the abdomen and pelvis was performed using the standard protocol following bolus administration of intravenous contrast. CONTRAST:  OMNIPAQUE IOHEXOL 300 MG/ML  SOLN COMPARISON:  06/19/2019 FINDINGS: Lower chest: The lung bases are clear of acute process. No pleural effusion or pulmonary lesions.  The heart is normal in size. No pericardial effusion. The distal esophagus and aorta are unremarkable. Hepatobiliary: No focal hepatic lesions or intrahepatic biliary dilatation. The gallbladder is normal. No common bile duct dilatation. Pancreas: No mass, inflammation or ductal dilatation. Spleen: Normal size.  No focal lesions. Adrenals/Urinary Tract: The adrenal glands and kidneys are unremarkable. No renal, ureteral or bladder calculi or mass. No findings to suggest pyelonephritis. Stomach/Bowel: The stomach, duodenum, small bowel and colon are grossly normal without oral contrast. No obvious acute inflammatory process, mass lesion or obstructive findings. The terminal ileum and appendix are normal. Vascular/Lymphatic: The aorta is normal in caliber. No dissection. The branch vessels are patent. The major venous structures are patent. No mesenteric or retroperitoneal mass or adenopathy. Small scattered lymph nodes are noted. Reproductive: The uterus and ovaries are unremarkable. Other: No pelvic mass or adenopathy. No free pelvic fluid collections. No inguinal mass or adenopathy. No abdominal wall hernia or subcutaneous lesions. Musculoskeletal: No significant bony findings. IMPRESSION: No acute abdominal/pelvic findings, mass lesions or adenopathy. Electronically Signed   By: Rudie MeyerP.  Gallerani M.D.   On: 09/20/2019 10:53    Procedures Procedures (including critical care time)  Medications Ordered in ED Medications  morphine 4 MG/ML injection 4 mg (4 mg Intravenous  Given 09/20/19 0939)  ondansetron (ZOFRAN) injection 4 mg (4 mg Intravenous Given 09/20/19 0939)  sodium chloride 0.9 % bolus 1,000 mL (0 mLs Intravenous Stopped 09/20/19 1100)  iohexol (OMNIPAQUE) 300 MG/ML solution 100 mL (100 mLs Intravenous Contrast Given 09/20/19 1012)     Initial Impression / Assessment and Plan / ED Course  I have reviewed the triage vital signs and the nursing notes.  Pertinent labs & imaging results that were available during my care of the patient were reviewed by me and considered in my medical decision making (see chart for details).        BP 111/64 (BP Location: Right Arm)   Pulse 77   Temp 99.5 F (37.5 C) (Oral)   Resp 18   LMP 09/17/2019   SpO2 100%    Final Clinical Impressions(s) / ED Diagnoses   Final diagnoses:  Nausea vomiting and diarrhea  Dehydration    ED Discharge Orders    None     7:55 AM Patient has abdominal pain with associate nausea vomiting diarrhea.  Patient was seen by me yesterday for her symptoms.  At that time she has a fairly benign abdominal exam, she received symptomatic treatment and felt better, her labs were reassuring.  Today upon return, she now have tenderness to her right lower quadrant concerning for potential appendicitis.  Will recheck labs and will obtain abdominal pelvis CT scan for further evaluation.  Pain medication given as well as antinausea medication and IV fluid.  2:57 PM Labs are mostly reassuring.  Increase ketones in urine were noted consistent with patient states of dehydration.Patient request for COVID-19 test, test obtained.  Abdominal pelvis CT scan obtained showing no acute pathology.  At this time patient able to tolerate soup.  And able to eat.  She is stable for discharge.  Return precaution given.  Stephanie [name] was evaluated in Emergency Department on 09/20/2019 for the symptoms described in the history of present illness. She was evaluated in the context of the global COVID-19  pandemic, which necessitated consideration that the patient might be at risk for infection with the SARS-CoV-2 virus that causes COVID-19. Institutional protocols and algorithms that pertain to the evaluation of patients at risk for COVID-19  are in a state of rapid change based on information released by regulatory bodies including the CDC and federal and state organizations. These policies and algorithms were followed during the patient's care in the ED.    Domenic Moras, PA-C 09/20/19 [place], [place], DO 09/20/19 1505

## 2019-09-20 NOTE — ED Notes (Signed)
Patient verbalizes understanding of discharge instructions. Opportunity for questioning and answers were provided. pt discharged from ED. Ambulatory by self  

## 2019-09-20 NOTE — ED Notes (Signed)
Pt completed PO trial with Ginger Ale and water. No n/v during challenge

## 2019-09-20 NOTE — ED Triage Notes (Signed)
Seen in ED yesterday.  States she had felt better but started vomiting again last night with blood streaked emesis.  C/o headache.

## 2019-09-21 ENCOUNTER — Telehealth: Payer: Self-pay

## 2019-09-21 NOTE — Telephone Encounter (Signed)
[facility] Night - Client TELEPHONE ADVICE RECORD [facility] Patient Name: [name] Gender: Female DOB: 1996-09-26 Age: 23 Y 9 M 27 D Return Phone Number: [phone] (Primary) Address: [name] [address] Client [place] Night - Client Client Site [place] - Night Physician Alma Friendly - NP Contact Type Call Who Is Calling Patient / Member / Family / Caregiver Call Type Triage / Clinical Relationship To Patient Self Return Phone Number [phone] (Primary) Chief Complaint VOMITING - Blood Reason for Call Symptomatic / Request for [place] stated she called yesterday about being sick since Thursday. Has not been able to keep food down. Was told to go to the ER. Was given fluids and nausea medication. Has not been able to stop throwing up since she got home. Now there is blood in her vomit. Translation No Nurse Assessment Nurse: Melina Modena, RN, Estill Bamberg Date/Time Eilene Ghazi Time): 09/20/2019 6:56:26 AM Confirm and document reason for call. If symptomatic, describe symptoms. ---Caller states she has been vomiting. Caller states she is vomting blood Has the patient had close contact with a person known or suspected to have the novel coronavirus illness OR traveled / lives in area with major community spread (including international travel) in the last 14 days from the onset of symptoms? * If Asymptomatic, screen for exposure and travel within the last 14 days. ---No Does the patient have any new or worsening symptoms? ---Yes Will a triage be completed? ---Yes Related visit to physician within the last 2 weeks? ---Yes Does the PT have any chronic conditions? (i.e. diabetes, asthma, this includes High risk factors for pregnancy, etc.) ---No Is the patient pregnant or possibly pregnant? (Ask all females between the ages of 27-55) ---No Is  this a behavioral health or substance abuse call? ---No Guidelines Guideline Title Affirmed Question Affirmed Notes Nurse Date/Time (Eastern Time) Vomiting Blood Weak, dizzy or lightheaded [place], [name] 09/20/2019 6:57:29 AM PLEASE NOTE: All timestamps contained within this report are represented as [place] Standard Time. CONFIDENTIALTY NOTICE: This fax transmission is intended only for the addressee. It contains information that is legally privileged, confidential or otherwise protected from use or disclosure. If you are not the intended recipient, you are strictly prohibited from reviewing, disclosing, copying using or disseminating any of this information or taking any action in reliance on or regarding this information. If you have received this fax in error, please notify us immediately by telephone so that we can arrange for its return to [place]. Phone: [phone], Toll-Free: [phone], Fax: [phone] Page: 2 of 2 Call Id: [id number] [place]. Time Eilene Ghazi Time) Disposition Final User 09/20/2019 6:51:35 AM Send to Urgent Queue Netta Corrigan 09/20/2019 7:01:57 AM Go to ED Now Yes Melina Modena, RN, Shelly Coss Disagree/Comply Comply Caller Understands Yes PreDisposition Call Doctor Care Advice Given Per Guideline GO TO ED NOW: CARE ADVICE given per Vomiting Blood (Adult) guideline. Referrals [facility] - ED

## 2019-09-21 NOTE — Telephone Encounter (Addendum)
Per chart review pt was seen at [facility] Ed on 09/19/19 &09/20/19.

## 2019-09-21 NOTE — Telephone Encounter (Signed)
[facility] Night - Client TELEPHONE ADVICE RECORD [facility]urse Patient Name: [facility] [place] Gender: Female DOB: [birth year] Age: 23 Y 9 M 26 D Return Phone Number: [phone] (Primary) Address: [place] [address] Client [facility] Night - Client Client Site  [facility] [place] - Night Physician Vernona Rieger - NP Contact Type Call Who Is Calling Patient / Member / Family / Caregiver Call Type Triage / Clinical Relationship To Patient Self Return Phone Number [phone] (Primary) Chief Complaint Vomiting Reason for Call Symptomatic / Request for Health Information Initial Comment Caller states she has been experiencing vomiting and she would like to know what to do. Translation No Nurse Assessment Nurse: Mauro Kaufmann, RN, Randa Evens Date/Time (Eastern Time): 09/19/2019 8:46:23 AM Confirm and document reason for call. If symptomatic, describe symptoms. ---Caller sates that she has been vomiting all day yesterday and today. She vomited 8 times yesterday and 2 times today, she has been drinking ginger ale, but hasn't been able to eat food. Patient took zofran and it isn't helping her. Each time it was a small amount of dark green that burns her throat. Patient is also sweating and fatigued. Patient also has been having some back pain and hasn't been able to take anything because she hasn't been able to eat. Has the patient had close contact with a person known or suspected to have the novel coronavirus illness OR traveled / lives in area with major community spread (including international travel) in the last 14 days from the onset of symptoms? * If Asymptomatic, screen for exposure and travel within the last 14 days. ---No Does the patient have any new or worsening symptoms? ---Yes Will a triage be completed? ---Yes Related visit to physician within the last 2 weeks? ---No Does the PT have any  chronic conditions? (i.e. diabetes, asthma, this includes High risk factors for pregnancy, etc.) ---No Is the patient pregnant or possibly pregnant? (Ask all females between the ages of 16-55) ---No Is this a behavioral health or substance abuse call? ---No PLEASE NOTE: All timestamps contained within this report are represented as [place] Standard Time. CONFIDENTIALTY NOTICE: This fax transmission is intended only for the addressee. It contains information that is legally privileged, confidential or otherwise protected from use or disclosure. If you are not the intended recipient, you are strictly prohibited from reviewing, disclosing, copying using or disseminating any of this information or taking any action in reliance on or regarding this information. If you have received this fax in error, please notify us immediately by telephone so that we can arrange for its return to [place]. Phone: [phone], Toll-Free: [phone], Fax: [phone] Page: 2 of 2 Call Id: [id number] Guidelines Guideline Title Affirmed Question Affirmed Notes Nurse Date/Time Lamount Cohen Time) Vomiting [1] SEVERE vomiting (e.g., 6 or more times/ day) AND [2] present > 8 hours (Exception: patient sounds well, is drinking liquids, does not sound dehydrated, and vomiting has lasted less than 24 hours) [place], RN, Randa Evens 09/19/2019 8:54:11 AM Disp. Time Lamount Cohen Time) Disposition Final User 09/19/2019 8:58:49 AM Go to ED Now (or PCP triage) Yes Mauro Kaufmann, RN, Lily Peer Disagree/Comply Comply Caller Understands Yes PreDisposition Go to Urgent Care/Walk-In Clinic Care Advice Given Per Guideline GO TO ED NOW (OR PCP TRIAGE): * IF NO PCP (PRIMARY CARE PROVIDER) SECOND-LEVEL TRIAGE: You need to be seen within the next hour. Go to the ED/UCC at _____________ Hospital. Leave as soon as you can. CARE ADVICE per Vomiting (Adult) guideline. * It  is also a good idea to bring the pill bottles too. This will help the doctor  to make certain you are taking the right medicines and the right dose. * Please bring a list of your current medicines when you go to see the doctor. BRING MEDICINES: CONTAINER: * You may wish to bring a bucket or container with you in case there is more vomiting during the drive. Referrals [facility] - ED

## 2019-09-21 NOTE — Telephone Encounter (Signed)
It appears that patient was evaluated in the ED on 09/19/19 and 09/20/19. How's she dong now?

## 2019-09-23 NOTE — Telephone Encounter (Signed)
Patient called back on Tuesday 09/22/2019. She stated that doing better now. Patient to Stephanie [name] to know that she is going to her GYN because she stated to feel sick when her period started and it was different from her other cycles. She will update if needed to.

## 2019-09-23 NOTE — Telephone Encounter (Signed)
Noted  

## 2019-10-06 IMAGING — CT CT ABD-PELV W/ CM
3 of 10 series · 10 of 46 positions shown, 16 images · IV contrast (iopamidol)
Comparison: Prior radiograph from 10/19/2014

CLINICAL DATA: Initial evaluation for acute left-sided chest pain
since yesterday, shortness of breath, productive cough. History of
recent laparoscopy on 10/20/2018.

EXAM:
CT ANGIOGRAPHY CHEST
CT ABDOMEN AND PELVIS WITH CONTRAST
TECHNIQUE: Multidetector CT imaging of the chest was performed using the
standard protocol during bolus administration of intravenous
contrast. Multiplanar CT image reconstructions and MIPs were
obtained to evaluate the vascular anatomy. Multidetector CT imaging
of the abdomen and pelvis was performed using the standard protocol
during bolus administration of intravenous contrast.
CONTRAST:  100mL [id number] IOPAMIDOL ([id number]) INJECTION 76%

[Series 7: pe thins · axial · 0.62mm/px · z∈[+28,+118]mm · 4 of 224 slices shown]
[im 15/224  soft-tissue]
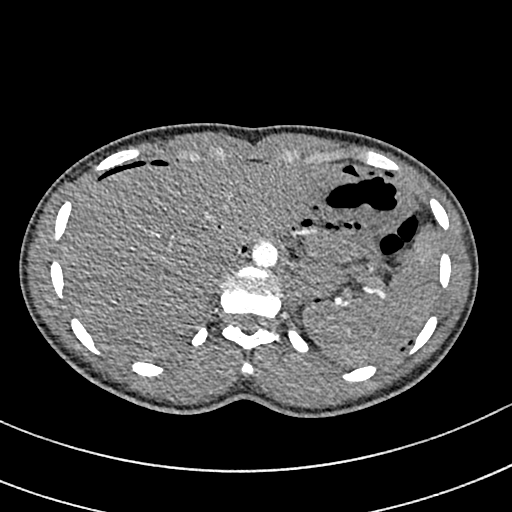
[im 45/224  soft-tissue]
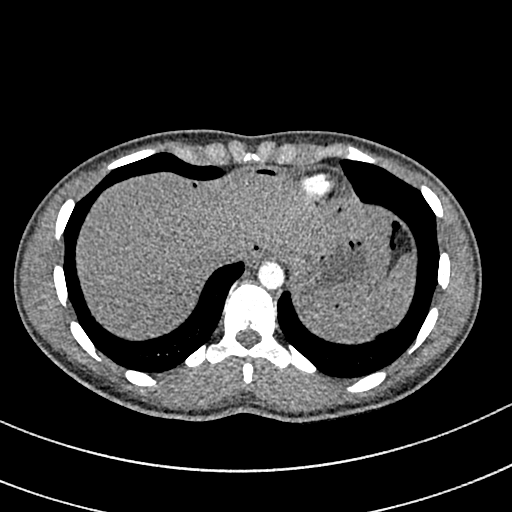
[im 75/224  soft-tissue]
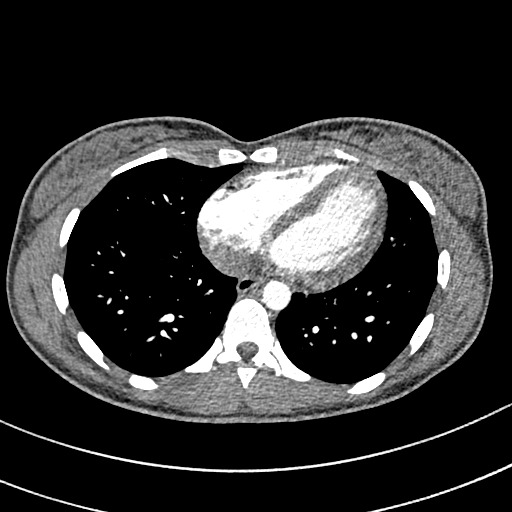
[im 105/224  soft-tissue]
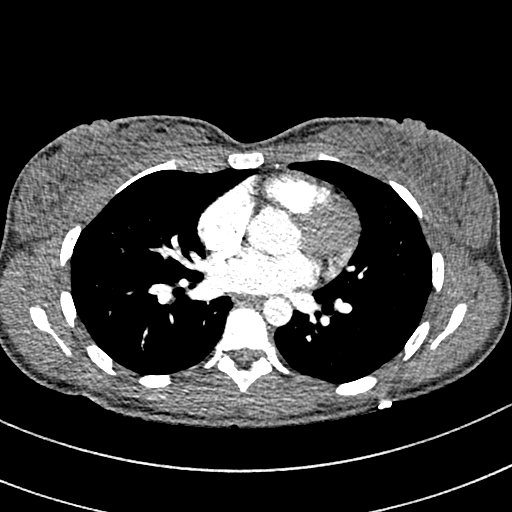

[Series 12: a/p w/ 5mm · axial · 0.58mm/px · z∈[-245,+0]mm · 4 of 83 slices shown, 9 images]
[im 17/83  soft-tissue]
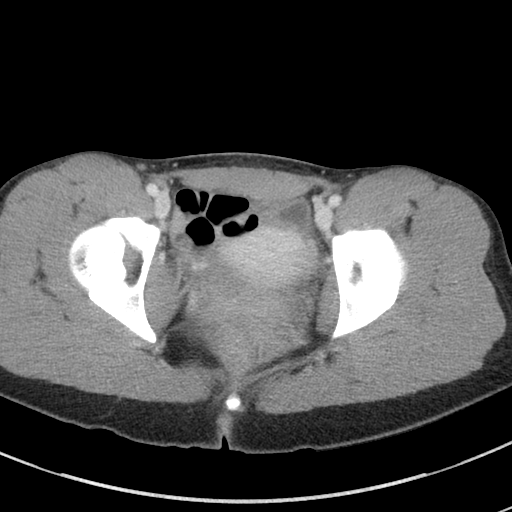
[im 17/83  lung]
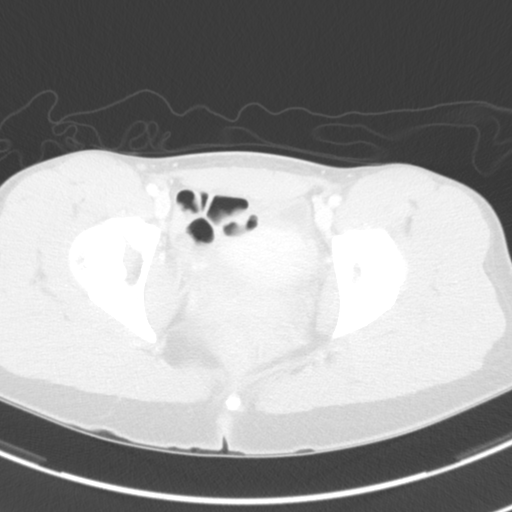
[im 17/83  bone]
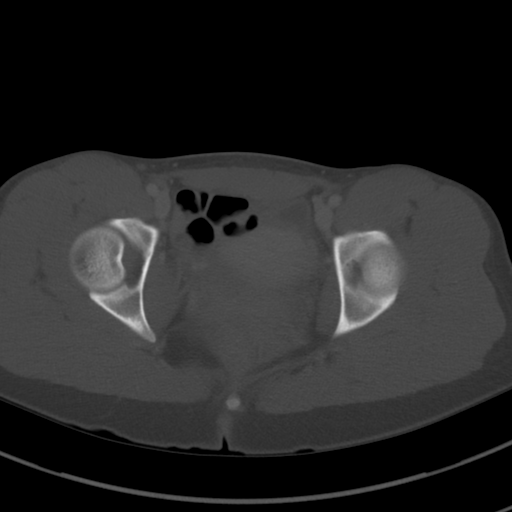
[im 33/83  soft-tissue]
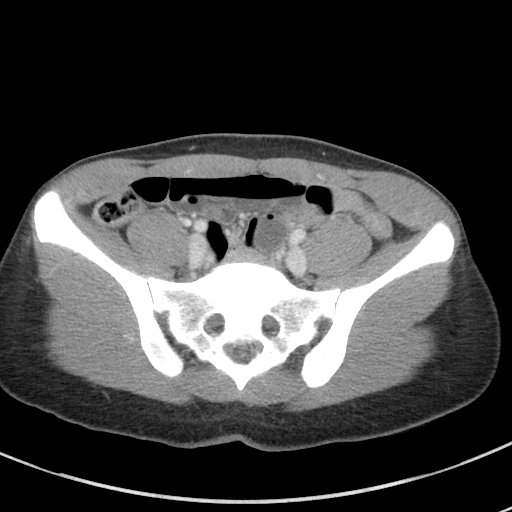
[im 33/83  lung]
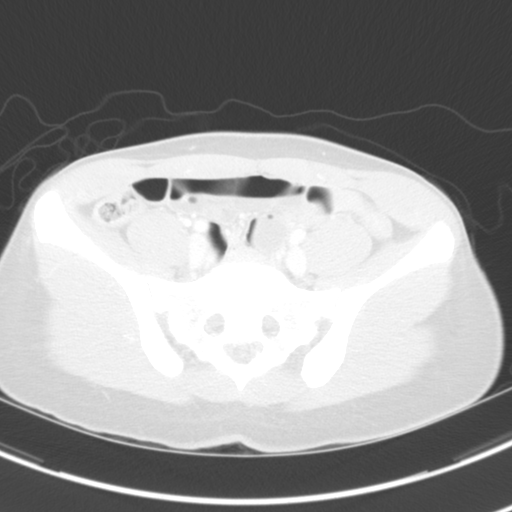
[im 50/83  soft-tissue]
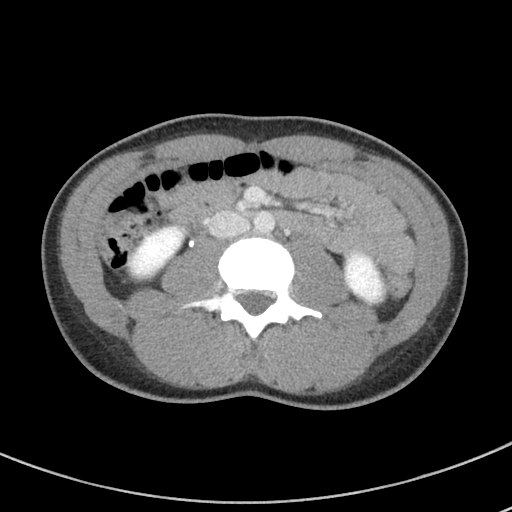
[im 50/83  lung]
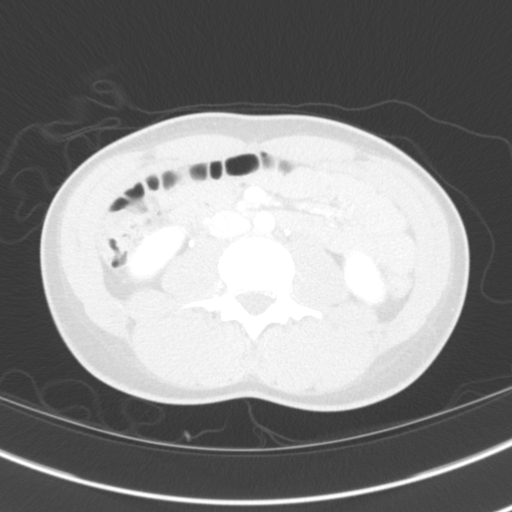
[im 66/83  soft-tissue]
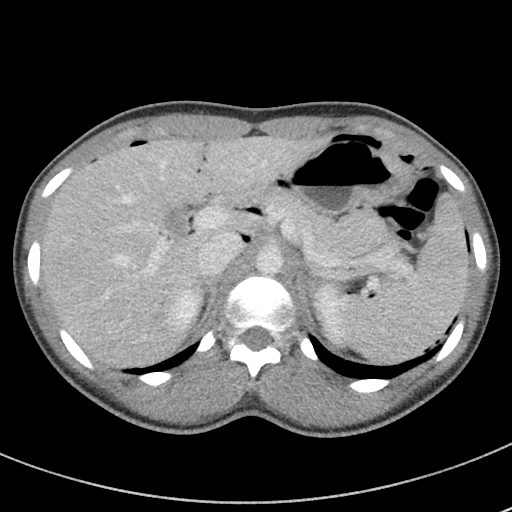
[im 66/83  lung]
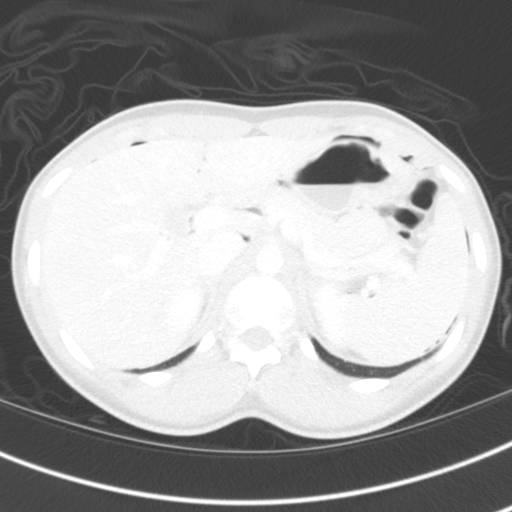

[Series 15: a/p w/ cor · coronal · 0.64mm/px · 2 of 118 slices shown, 3 images]
[im 40/118  soft-tissue]
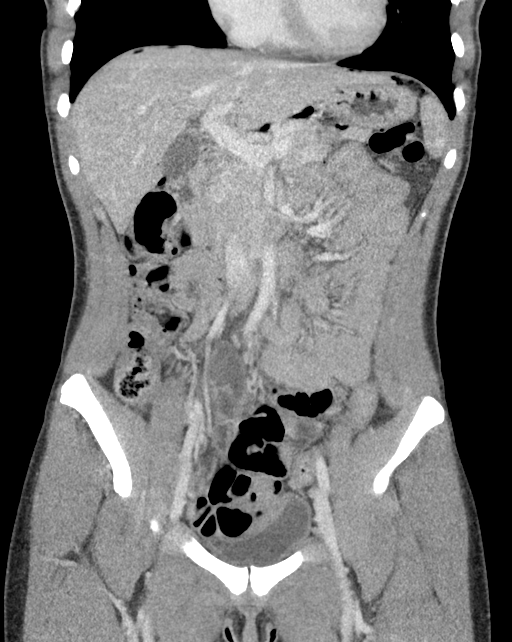
[im 40/118  bone]
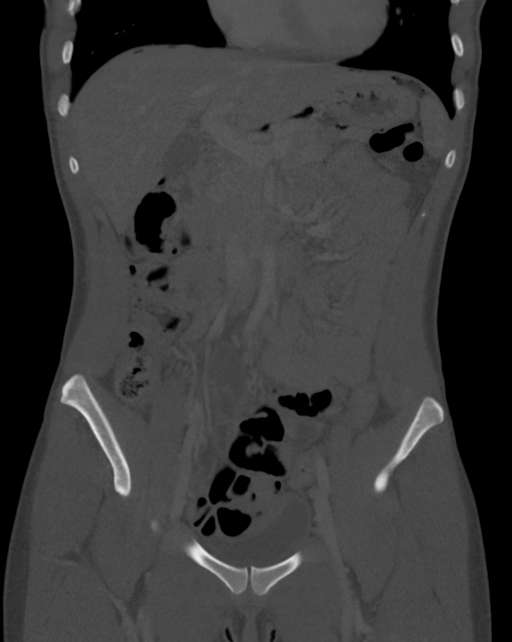
[im 79/118  soft-tissue]
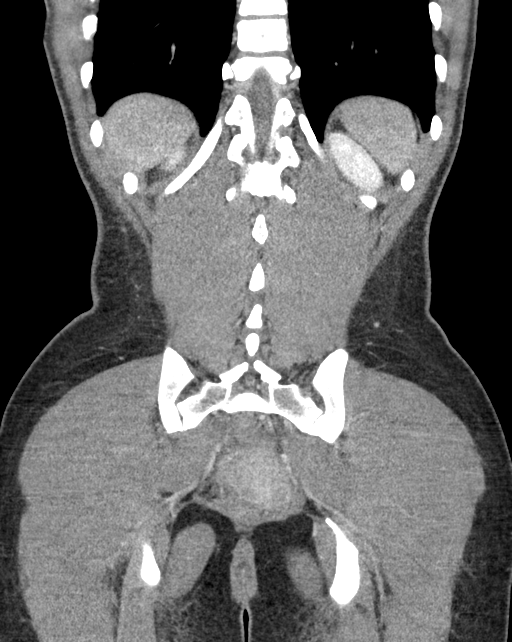

[10 of 46 positions shown; findings below may reference images not displayed]

FINDINGS: CTA CHEST FINDINGS

Cardiovascular: Intrathoracic aorta of normal caliber without
aneurysm or other acute finding. Visualized great vessels within
normal limits. Heart size normal. No pericardial effusion. Pulmonary
arterial tree adequately opacified for evaluation. Main pulmonary
artery within normal limits for caliber. No filling defect to
suggest acute pulmonary embolism. Re-formatted imaging confirms
these findings.

Mediastinum/Nodes: Partially visualized thyroid within normal
limits. No enlarged mediastinal, hilar, or axillary lymph nodes
identified. Esophagus within normal limits.

Lungs/Pleura: Tracheobronchial tree widely patent and intact. Lungs
well inflated bilaterally. No focal infiltrates, pulmonary edema, or
pleural effusion. No pneumothorax. Single punctate 3 mm subpleural
nodule present at the posterior left lower lobe (series 6, image
71), of doubtful significance given patient age. No other pulmonary
nodule or mass.

Musculoskeletal: External soft tissues within normal limits. Osseous
structures demonstrate no acute finding. No discrete lytic or
blastic osseous lesions.

Review of the MIP images confirms the above findings.

CT ABDOMEN and PELVIS FINDINGS

Hepatobiliary: Liver demonstrates a normal contrast enhanced
appearance. Gallbladder within normal limits. No biliary dilatation.

Pancreas: Pancreas within normal limits.

Spleen: Spleen within normal limits.

Adrenals/Urinary Tract: Adrenal glands are normal. Kidneys equal in
size with symmetric enhancement. No nephrolithiasis, hydronephrosis,
or focal enhancing renal mass. No hydroureter. Partially distended
bladder demonstrates no acute finding. Secreted IV contrast material
noted within the posterior bladder lumen.

Stomach/Bowel: Stomach within normal limits. No evidence for bowel
obstruction. No findings to suggest acute appendicitis. Descending
colon is largely decompressed with associated mild circumferential
wall thickening. No other acute inflammatory changes seen elsewhere
about the bowels.

Vascular/Lymphatic: Normal intravascular enhancement seen throughout
the intra-abdominal aorta. Mesenteric vessels patent proximally. No
adenopathy.

Reproductive: Uterus within normal limits. Right ovary unremarkable.
2 cm left ovarian corpus luteal cyst noted.

Other: Scattered foci of free air seen throughout the upper abdomen,
likely postoperative in nature related to recent laparoscopy. No
significant free fluid. No postoperative collections or other
complication.

Musculoskeletal: Small area of focal skin thickening noted along the
midline of the lower mid abdomen/pelvis (series 12, image 62),
indeterminate. External soft tissues otherwise unremarkable. No
acute osseus abnormality. No discrete lytic or blastic osseous
lesions.

Review of the MIP images confirms the above findings.
IMPRESSION: 1. No CT evidence for acute pulmonary embolism. No other acute
cardiopulmonary abnormality identified within the chest.
2. Scattered foci of free intraperitoneal air scattered throughout
the upper abdomen, presumably related to recent laparoscopy. No
loculated intra-abdominal collections or other complication
identified status post surgery.
3. No other acute abnormality within the abdomen and pelvis.

Results were discussed by telephone at the time of interpretation on
10/22/2018 at [DATE] with the emergency room physician.

## 2019-10-22 ENCOUNTER — Ambulatory Visit: Payer: No Typology Code available for payment source | Admitting: Internal Medicine

## 2019-10-22 NOTE — Progress Notes (Deleted)
Subjective:    Patient ID: Stephanie [name], female    DOB: 1996-04-17, 22 y.o.   MRN: [id number]  HPI  Pt presents to the clinic today with c/o abdominal cramping. This started. Her LMP was.  She also c/o low back pain. This started. She describes the pain as. She reports associated. She denies. She has tried.  Review of Systems      Past Medical History:  Diagnosis Date  . Acute pancreatitis without infection or necrosis 04/21/2019  . Anemia   . Anxiety 01/28/2013   no meds  . Depression    no meds  . IBS (irritable bowel syndrome)   . Migraines    HA every other day - otc med prn  . Recurrent streptococcal tonsillitis   . Seasonal allergies    Seasonal environmental allergies  . Sebaceous cyst 08/04/2018   L mid back   . Vision abnormalities    Myopia; patient has history of devloping ulcers when she wore contacts.    Current Outpatient Medications  Medication Sig Dispense Refill  . acetaminophen (TYLENOL) 500 MG tablet Take 1,000 mg by mouth every 6 (six) hours as needed for mild pain.     Marland Kitchen etonogestrel (NEXPLANON) 68 MG IMPL implant 1 each by Subdermal route once.    . ondansetron (ZOFRAN ODT) 4 MG disintegrating tablet Take 1 tablet (4 mg total) by mouth every 8 (eight) hours as needed for nausea. 10 tablet 0   No current facility-administered medications for this visit.     Allergies  Allergen Reactions  . Coconut Flavor Other (See Comments)    Mouth itches, but no impaired breathing (per patient  . Septra [Sulfamethoxazole-Trimethoprim] Nausea Only and Other (See Comments)    Patient took a few courses of this and it ended up being stopped by a MD because it had started causing "bad stomach pain" (per the patient)    Family History  Problem Relation Age of Onset  . Depression Mother   . Migraines Mother   . Migraines Father     Social History   Socioeconomic History  . Marital status: Single    Spouse name: Not on file  . Number of children: Not  on file  . Years of education: Not on file  . Highest education level: Not on file  Occupational History  . Not on file  Social Needs  . Financial resource strain: Not on file  . Food insecurity    Worry: Not on file    Inability: Not on file  . Transportation needs    Medical: Not on file    Non-medical: Not on file  Tobacco Use  . Smoking status: Never Smoker  . Smokeless tobacco: Never Used  Substance and Sexual Activity  . Alcohol use: Never    Frequency: Never    Comment: socially  . Drug use: Yes    Types: Marijuana    Comment: Last use Thursday 10/16/18  . Sexual activity: Not Currently    Partners: Male    Birth control/protection: Implant    Comment: broke up with first sexual partner 1 month ago  Lifestyle  . Physical activity    Days per week: Not on file    Minutes per session: Not on file  . Stress: Not on file  Relationships  . Social Herbalist on phone: Not on file    Gets together: Not on file    Attends religious service: Not on file  Active member of club or organization: Not on file    Attends meetings of clubs or organizations: Not on file    Relationship status: Not on file  . Intimate partner violence    Fear of current or ex partner: Not on file    Emotionally abused: Not on file    Physically abused: Not on file    Forced sexual activity: Not on file  Other Topics Concern  . Not on file  Social History Narrative   Single.    Student at [employer].   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    Enjoys listening to music, hanging out with friends.      Constitutional: Denies fever, malaise, fatigue, headache or abrupt weight changes.  HEENT: Denies eye pain, eye redness, ear pain, ringing in the ears, wax buildup, runny nose, nasal congestion, bloody nose, or sore throat. Respiratory: Denies difficulty breathing, shortness of breath, cough or sputum production.   Cardiovascular: Denies chest pain, chest tightness,  palpitations or swelling in the hands or feet.  Gastrointestinal: Pt reports abdominal cramping. Denies bloating, constipation, diarrhea or blood in the stool.  GU: Denies urgency, frequency, pain with urination, burning sensation, blood in urine, odor or discharge. Musculoskeletal: Pt reports low back pain. Denies decrease in range of motion, difficulty with gait,  or joint swelling.  Skin: Denies redness, rashes, lesions or ulcercations.  Neurological: Denies dizziness, difficulty with memory, difficulty with speech or problems with balance and coordination.  Psych: Denies anxiety, depression, SI/HI.  No other specific complaints in a complete review of systems (except as listed in HPI above).  Objective:   Physical Exam        Assessment & Plan:

## 2019-10-23 MED FILL — FEMYNOR 0.25-35 MG-MCG TABS: 0.25-35 | 56 days supply | Qty: 56 | Fill #0

## 2019-10-26 ENCOUNTER — Encounter (HOSPITAL_COMMUNITY): Payer: Self-pay

## 2019-10-26 ENCOUNTER — Emergency Department (HOSPITAL_COMMUNITY)
Admission: EM | Admit: 2019-10-26 | Discharge: 2019-10-26 | Disposition: A | Discharge status: Home / Self Care | Payer: No Typology Code available for payment source | Attending: Emergency Medicine | Admitting: Emergency Medicine

## 2019-10-26 ENCOUNTER — Other Ambulatory Visit: Payer: Self-pay

## 2019-10-26 DIAGNOSIS — R112 Nausea with vomiting, unspecified: Secondary | ICD-10-CM | POA: Diagnosis not present

## 2019-10-26 DIAGNOSIS — R197 Diarrhea, unspecified: Secondary | ICD-10-CM | POA: Insufficient documentation

## 2019-10-26 DIAGNOSIS — Z79899 Other long term (current) drug therapy: Secondary | ICD-10-CM | POA: Insufficient documentation

## 2019-10-26 DIAGNOSIS — E860 Dehydration: Secondary | ICD-10-CM | POA: Diagnosis not present

## 2019-10-26 DIAGNOSIS — F419 Anxiety disorder, unspecified: Secondary | ICD-10-CM | POA: Diagnosis not present

## 2019-10-26 LAB — COMPREHENSIVE METABOLIC PANEL
ALT: 14 U/L (ref 0–44)
AST: 20 U/L (ref 15–41)
Albumin: 4.1 g/dL (ref 3.5–5.0)
Alkaline Phosphatase: 32 U/L — ABNORMAL LOW (ref 38–126)
Anion gap: 10 (ref 5–15)
BUN: 9 mg/dL (ref 6–20)
CO2: 21 mmol/L — ABNORMAL LOW (ref 22–32)
Calcium: 9 mg/dL (ref 8.9–10.3)
Chloride: 107 mmol/L (ref 98–111)
Creatinine, Ser: 0.75 mg/dL (ref 0.44–1.00)
GFR calc Af Amer: >60 mL/min (ref >60)
GFR calc non Af Amer: >60 mL/min (ref >60)
Glucose, Bld: 88 mg/dL (ref 70–99)
Potassium: 3.9 mmol/L (ref 3.5–5.1)
Sodium: 138 mmol/L (ref 135–145)
Total Bilirubin: 0.9 mg/dL (ref 0.3–1.2)
Total Protein: 7.1 g/dL (ref 6.5–8.1)

## 2019-10-26 LAB — URINALYSIS, ROUTINE W REFLEX MICROSCOPIC
Bilirubin Urine: NEGATIVE
Glucose, UA: NEGATIVE mg/dL
Ketones, ur: 80 mg/dL — AB
Leukocytes,Ua: NEGATIVE
Nitrite: NEGATIVE
Protein, ur: 30 mg/dL — AB
Specific Gravity, Urine: 1.025 (ref 1.005–1.030)
pH: 6 (ref 5.0–8.0)

## 2019-10-26 LAB — CBC WITH DIFFERENTIAL/PLATELET
Abs Immature Granulocytes: 0.01 10*3/uL (ref 0.00–0.07)
Basophils Absolute: 0 10*3/uL (ref 0.0–0.1)
Basophils Relative: 0 %
Eosinophils Absolute: 0 10*3/uL (ref 0.0–0.5)
Eosinophils Relative: 0 %
HCT: 37.4 % (ref 36.0–46.0)
Hemoglobin: 12.4 g/dL (ref 12.0–15.0)
Immature Granulocytes: 0 %
Lymphocytes Relative: 18 %
Lymphs Abs: 1.3 10*3/uL (ref 0.7–4.0)
MCH: 30.1 pg (ref 26.0–34.0)
MCHC: 33.2 g/dL (ref 30.0–36.0)
MCV: 90.8 fL (ref 80.0–100.0)
Monocytes Absolute: 0.3 10*3/uL (ref 0.1–1.0)
Monocytes Relative: 4 %
Neutro Abs: 5.4 10*3/uL (ref 1.7–7.7)
Neutrophils Relative %: 78 %
Platelets: 234 10*3/uL (ref 150–400)
RBC: 4.12 MIL/uL (ref 3.87–5.11)
RDW: 13.3 % (ref 11.5–15.5)
WBC: 7 10*3/uL (ref 4.0–10.5)
nRBC: 0 % (ref 0.0–0.2)

## 2019-10-26 LAB — I-STAT BETA HCG BLOOD, ED (MC, WL, AP ONLY): I-stat hCG, quantitative: <5 m[IU]/mL (ref <5)

## 2019-10-26 LAB — LIPASE, BLOOD: Lipase: 20 U/L (ref 11–51)

## 2019-10-26 MED ORDER — ONDANSETRON HCL 4 MG/2ML IJ SOLN
4.0000 mg | Freq: Once | INTRAMUSCULAR | Status: AC
  Administered 2019-10-26: 4 mg via INTRAVENOUS
  Filled 2019-10-26: qty 2

## 2019-10-26 MED ORDER — DICYCLOMINE HCL 10 MG PO CAPS
20.0000 mg | ORAL_CAPSULE | Freq: Once | ORAL | Status: AC
  Administered 2019-10-26: 20 mg via ORAL
  Filled 2019-10-26: qty 2

## 2019-10-26 MED ORDER — ONDANSETRON 8 MG PO TBDP: 8.0000 mg | ORAL_TABLET | Freq: Three times a day (TID) | ORAL | 0 refills | Status: DC | PRN

## 2019-10-26 MED ORDER — SODIUM CHLORIDE 0.9 % IV BOLUS
1000.0000 mL | Freq: Once | INTRAVENOUS | Status: AC
  Administered 2019-10-26: 1000 mL via INTRAVENOUS

## 2019-10-26 MED ORDER — DICYCLOMINE HCL 20 MG PO TABS: 20.0000 mg | ORAL_TABLET | Freq: Two times a day (BID) | ORAL | 0 refills | Status: DC

## 2019-10-26 NOTE — ED Notes (Signed)
Pt tolerated drinking and eating

## 2019-10-26 NOTE — Discharge Instructions (Addendum)
Nausea, Vomiting, and Diarrhea  Hand washing: Wash your hands throughout the day, but especially before and after touching the face, using the restroom, sneezing, coughing, or touching surfaces that have been coughed or sneezed upon. Hydration: Symptoms will be intensified and complicated by dehydration. Dehydration can also extend the duration of symptoms. Drink plenty of fluids and get plenty of rest. You should be drinking at least half a liter of water an hour to stay hydrated. Electrolyte drinks (ex. Gatorade, Powerade, Pedialyte) are also encouraged. You should be drinking enough fluids to make your urine light yellow, almost clear. If this is not the case, you are not drinking enough water. Please note that some of the treatments indicated below will not be effective if you are not adequately hydrated. Diet: Please concentrate on hydration, however, you may introduce food slowly.  Start with a clear liquid diet, progressed to a full liquid diet, and then bland solids as you are able. Pain or fever: Ibuprofen, Naproxen, or Tylenol for pain or fever.  Nausea/vomiting: Use the ondansetron (generic for Zofran) for nausea or vomiting.  This medication may not prevent all vomiting or nausea, but can help facilitate better hydration. Things that can help with nausea/vomiting also include peppermint/menthol candies, vitamin B12, and ginger. Diarrhea: May use medications such as loperamide (Imodium) or Bismuth subsalicylate (Pepto-Bismol). Bentyl: This medication is what is known as an antispasmodic and is intended to help reduce abdominal discomfort. Follow-up: Follow-up with a primary care provider on this matter. Return: Return should you develop a fever, bloody diarrhea, increased abdominal pain, uncontrolled vomiting, or any other major concerns.  For prescription assistance, may try using prescription discount sites or apps, such as goodrx.com 

## 2019-10-26 NOTE — ED Provider Notes (Signed)
[facility]-EMERGENCY DEPT Provider Note   CSN: [id number] Arrival date & time: 10/26/19  1916     History   Chief Complaint Chief Complaint  Patient presents with  . Emesis    HPI Stephanie [name] is a 23 y.o. female.     HPI   Stephanie [name] is a 23 y.o. female, with a history of IBS, anxiety, presenting to the ED with nausea and vomiting for the past 5 days. Has been experiencing these symptoms with her menstrual cycles.  Contacted her OBGYN December 3, who prescribed her "an extra birth control pill to help with the nausea and vomiting." Negative pregnancy test at that time. Her cycle ended yesterday, but she continues to have nausea and vomiting. Diarrhea starting yesterday with 2-3 soft stools a day. She has been experiencing generalized abdominal cramping, mild, relieved after vomiting or having a bowel movement.  Denies fever/chills, urinary symptoms, chest pain, vaginal discharge, hematochezia/melena, or any other complaints.     Past Medical History:  Diagnosis Date  . Acute pancreatitis without infection or necrosis 04/21/2019  . Anemia   . Anxiety 01/28/2013   no meds  . Depression    no meds  . IBS (irritable bowel syndrome)   . Migraines    HA every other day - otc med prn  . Recurrent streptococcal tonsillitis   . Seasonal allergies    Seasonal environmental allergies  . Sebaceous cyst 08/04/2018   L mid back   . Vision abnormalities    Myopia; patient has history of devloping ulcers when she wore contacts.    Patient Active Problem List   Diagnosis Date Noted  . Flank pain 07/10/2019  . Near syncope 06/05/2019  . Axillary hidradenitis suppurativa 03/05/2019  . Recurrent infection of skin 02/04/2019  . Preventative health care 11/20/2017  . Menorrhagia with regular cycle 09/24/2014  . Migraines 08/04/2014  . Allergic rhinitis 04/03/2013  . MDD (major depressive disorder), single episode, severe (HCC) 01/28/2013   Class: Chronic  . Generalized anxiety disorder 01/28/2013    Past Surgical History:  Procedure Laterality Date  . COLONOSCOPY     IBS  . COLONOSCOPY     IBS  . LAPAROSCOPY N/A 10/20/2018   Procedure: LAPAROSCOPY DIAGNOSTIC possible removal of endometriosis;  Surgeon: Mitchel Honour, DO;  Location: [facility];  Service: Gynecology;  Laterality: N/A;     OB History    Gravida  0   Para  0   Term  0   Preterm  0   AB  0   Living  0     SAB  0   TAB  0   Ectopic  0   Multiple  0   Live Births  0        Obstetric Comments  Menstrual age: 70  Age 1st Pregnancy: N/A          Home Medications    Prior to Admission medications   Medication Sig Start Date End Date Taking? Authorizing Provider  acetaminophen (TYLENOL) 500 MG tablet Take 1,000 mg by mouth every 6 (six) hours as needed for mild pain.    Yes [provider]  etonogestrel (NEXPLANON) 68 MG IMPL implant 1 each by Subdermal route once.   Yes [provider]  ibuprofen (ADVIL) 200 MG tablet Take 200 mg by mouth every 6 (six) hours as needed for moderate pain.   Yes [provider]  norgestimate-ethinyl estradiol [facility]) 0.25-35 MG-MCG tablet Take 1 tablet by  mouth daily.   Yes [provider]  dicyclomine (BENTYL) 20 MG tablet Take 1 tablet (20 mg total) by mouth 2 (two) times daily. 10/26/19   Shaniya Tashiro C, PA-C  ondansetron (ZOFRAN ODT) 8 MG disintegrating tablet Take 1 tablet (8 mg total) by mouth every 8 (eight) hours as needed for nausea or vomiting. 10/26/19   Mitchell Iwanicki C, PA-C  metoCLOPramide (REGLAN) 10 MG tablet Take 1 tablet (10 mg total) by mouth every 6 (six) hours as needed for nausea or vomiting. Patient not taking: Reported on 09/19/2019 06/19/19 09/19/19  Alveria Apley, PA-C    Family History Family History  Problem Relation Age of Onset  . Depression Mother   . Migraines Mother   . Migraines Father     Social History Social History   Tobacco  Use  . Smoking status: Never Smoker  . Smokeless tobacco: Never Used  Substance Use Topics  . Alcohol use: Never    Frequency: Never    Comment: socially  . Drug use: Yes    Types: Marijuana    Comment: Last use Thursday 10/16/18     Allergies   Coconut flavor and Septra [sulfamethoxazole-trimethoprim]   Review of Systems Review of Systems  Constitutional: Negative for chills and fever.  Respiratory: Negative for cough and shortness of breath.   Cardiovascular: Negative for chest pain and leg swelling.  Gastrointestinal: Positive for abdominal pain, diarrhea, nausea and vomiting. Negative for blood in stool.  Genitourinary: Negative for dysuria, flank pain, frequency, vaginal bleeding and vaginal discharge.  Musculoskeletal: Negative for back pain.  Neurological: Negative for syncope and weakness.  All other systems reviewed and are negative.    Physical Exam Updated Vital Signs BP 120/87 (BP Location: Right Arm)   Pulse 100   Temp 99.1 F (37.3 C) (Oral)   Resp 16   Ht 5\' 2"  (1.575 m)   Wt 52.2 kg   LMP 10/25/2019   SpO2 100%   BMI 21.03 kg/m   Physical Exam Vitals signs and nursing note reviewed.  Constitutional:      General: She is not in acute distress.    Appearance: She is well-developed. She is not diaphoretic.     Comments: Moves around without noted pain or hesitation.  Switches positions when asked to do so, sits up, lays back down, and turns from her side to her back, all without noted signs of pain or hesitation.  HENT:     Head: Normocephalic and atraumatic.     Mouth/Throat:     Mouth: Mucous membranes are moist.     Pharynx: Oropharynx is clear.  Eyes:     Conjunctiva/sclera: Conjunctivae normal.  Neck:     Musculoskeletal: Neck supple.  Cardiovascular:     Rate and Rhythm: Normal rate and regular rhythm.     Pulses: Normal pulses.          Radial pulses are 2+ on the right side and 2+ on the left side.       Posterior tibial pulses are  2+ on the right side and 2+ on the left side.     Heart sounds: Normal heart sounds.     Comments: Tactile temperature in the extremities appropriate and equal bilaterally. Pulmonary:     Effort: Pulmonary effort is normal. No respiratory distress.     Breath sounds: Normal breath sounds.  Abdominal:     Palpations: Abdomen is soft.     Tenderness: There is abdominal tenderness. There is no  guarding.     Comments: Patient initially indicates generalized abdominal tenderness, but shows no reaction during palpation. When her abdomen was palpated again, she again showed no reaction and when asked if the palpation was painful, she shrugged and said, "not really."  Musculoskeletal:     Right lower leg: No edema.     Left lower leg: No edema.  Lymphadenopathy:     Cervical: No cervical adenopathy.  Skin:    General: Skin is warm and dry.  Neurological:     Mental Status: She is alert.  Psychiatric:        Mood and Affect: Mood and affect normal.        Speech: Speech normal.        Behavior: Behavior normal.      ED Treatments / Results  Labs (all labs ordered are listed, but only abnormal results are displayed) Labs Reviewed  COMPREHENSIVE METABOLIC PANEL - Abnormal; Notable for the following components:      Result Value   CO2 21 (*)    Alkaline Phosphatase 32 (*)    All other components within normal limits  URINALYSIS, ROUTINE W REFLEX MICROSCOPIC - Abnormal; Notable for the following components:   APPearance HAZY (*)    Hgb urine dipstick SMALL (*)    Ketones, ur 80 (*)    Protein, ur 30 (*)    Bacteria, UA RARE (*)    All other components within normal limits  URINE CULTURE  LIPASE, BLOOD  CBC WITH DIFFERENTIAL/PLATELET  I-STAT BETA HCG BLOOD, ED ([facility], [facility], [facility] ONLY)    EKG None  Radiology No results found.  Procedures Procedures (including critical care time)  Medications Ordered in ED Medications  sodium chloride 0.9 % bolus 1,000 mL (0 mLs Intravenous  Stopped 10/26/19 2202)  ondansetron (ZOFRAN) injection 4 mg (4 mg Intravenous Given 10/26/19 2116)  dicyclomine (BENTYL) capsule 20 mg (20 mg Oral Given 10/26/19 2118)     Initial Impression / Assessment and Plan / ED Course  I have reviewed the triage vital signs and the nursing notes.  Pertinent labs & imaging results that were available during my care of the patient were reviewed by me and considered in my medical decision making (see chart for details).  Clinical Course as of Oct 26 108  Mon Oct 26, 2019  2134 Patient reevaluated.  She states she feels a little bit better.   [SJ]  2320 Patient states she feels completely better.  Repeat abdominal exam benign.  She states she is ready for discharge.   [SJ]    Clinical Course User Index [SJ] Andrey Mccaskill C, PA-C       Patient presents with nausea, vomiting, and loose stools. Patient is nontoxic appearing, afebrile, not tachycardic, not tachypneic, not hypotensive, maintains excellent SPO2 on room air, and is in no apparent distress.  No leukocytosis.  My suspicion for surgical abdomen is low. She had no instances of vomiting or diarrhea during her ED course. Ketonuria and mild decrease in CO2 suggest dehydration.  Shared decision making was had during the initial part of the patient's visit regarding abdominal imaging and further evaluation.  She states if she has any concerns in the pelvic region, she will follow-up with OB/GYN on the matter. We discussed abdominal imaging and its purpose, including specifically discussing appendicitis.  We decided upon a plan that does not include repeat abdominal imaging.  She understands there are some pathologies that cannot be ruled out without abdominal imaging. Advised  the patient to follow-up with gastroenterology.  The patient was given instructions for home care as well as return precautions. Patient voices understanding of these instructions, accepts the plan, and is comfortable with  discharge.  Findings and plan of care discussed with Lennice Sites, DO.   Vitals:   10/26/19 1924 10/26/19 1926 10/26/19 2100 10/26/19 2300  BP:  120/87 113/67 113/69  Pulse:  100 81 97  Resp:  16 17 18   Temp:  99.1 F (37.3 C)    TempSrc:  Oral    SpO2:  100% 100% 99%  Weight: 52.2 kg     Height: 5\' 2"  (1.575 m)        Final Clinical Impressions(s) / ED Diagnoses   Final diagnoses:  Nausea vomiting and diarrhea  Dehydration    ED Discharge Orders         Ordered    dicyclomine (BENTYL) 20 MG tablet  2 times daily     10/26/19 2330    ondansetron (ZOFRAN ODT) 8 MG disintegrating tablet  Every 8 hours PRN     10/26/19 2330           Lorayne Bender, PA-C 10/27/19 0113    Lennice Sites, DO 10/27/19 1853

## 2019-10-26 NOTE — ED Triage Notes (Signed)
Patient reports starting her menstrual cycle a week ago and about every other month during this time she begins vomiting and diarrhea. Saturday started having NVD. Also having intermittent right sided flank pain. Last dose of Zofran 10am today.

## 2019-10-27 ENCOUNTER — Telehealth: Payer: Self-pay

## 2019-10-27 DIAGNOSIS — R197 Diarrhea, unspecified: Secondary | ICD-10-CM

## 2019-10-27 DIAGNOSIS — R112 Nausea with vomiting, unspecified: Secondary | ICD-10-CM

## 2019-10-27 MED FILL — ONDANSETRON ODT 8 MG TABLET: 8 | 7 days supply | Qty: 20 | Fill #0

## 2019-10-27 MED FILL — DICYCLOMINE 20 MG TABLET: 20 | 10 days supply | Qty: 20 | Fill #0

## 2019-10-27 NOTE — Telephone Encounter (Signed)
Per chart review tab pt went to [facility] ED on 10/26/19.

## 2019-10-27 NOTE — Telephone Encounter (Signed)
Noted, referral placed.  

## 2019-10-27 NOTE — Telephone Encounter (Signed)
Patient called her phone number [phone].  Patient went to [facility] ER last night.  The ER wanted her to go to her GI doctor, Dr.Vincent Michail Sermon.  Patient has the Focus Plan and she needs a "referral" from her PCP to go to GI.

## 2019-10-27 NOTE — Telephone Encounter (Signed)
[facility] - Client TELEPHONE ADVICE RECORD [facility] Patient Name: Stephanie [name] Gender: Female DOB: May 27, 1996 Age: 23 Y 11 M 2 D Return Phone Number: [phone] (Primary) Address: [name] [address] Client [facility] [facility] - Client Client Site [facility] Physician Alma Friendly - NP Contact Type Call Who Is Calling Patient / Member / Family / Caregiver Call Type Triage / Clinical Relationship To Patient Self Return Phone Number [phone] (Primary) Chief Complaint Vomiting Reason for Call Symptomatic / Request for Health Information Initial Comment Pt started their menstrual cycle last wk and is on a new bc pill - has been vomiting (grey color). Translation No Nurse Assessment Nurse: Stephanie Jews, RN, Stephanie [name] Date/Time Stephanie [name] Time): 10/26/2019 6:01:30 PM Confirm and document reason for call. If symptomatic, describe symptoms. ---Pt started their menstrual cycle last wk and is on a new bc pill (Femynor) - has been vomiting (grey color). She states she started the new oral birth control pill on Thursday and started vomiting a greenish grey substance today. She also had diarrhea and abdominal cramping yesterday evening. Caller denies any other symptoms, being around anyone else that may have been sick. She states she is also having occasional cramping in her lower abdomen. Has the patient had close contact with a person known or suspected to have the novel coronavirus illness OR traveled / lives in area with major community spread (including international travel) in the last 14 days from the onset of symptoms? * If Asymptomatic, screen for exposure and travel within the last 14 days. ---No Does the patient have any new or worsening symptoms? ---Yes Will a triage be completed? ---Yes Related visit to physician within the last 2 weeks? ---Yes Does the PT have any  chronic conditions? (i.e. diabetes, asthma, this includes High risk factors for pregnancy, etc.) ---No Is the patient pregnant or possibly pregnant? (Ask all females between the ages of 75-55) ---No Is this a behavioral health or substance abuse call? ---No PLEASE NOTE: All timestamps contained within this report are represented as [place] Standard Time. CONFIDENTIALTY NOTICE: This fax transmission is intended only for the addressee. It contains information that is legally privileged, confidential or otherwise protected from use or disclosure. If you are not the intended recipient, you are strictly prohibited from reviewing, disclosing, copying using or disseminating any of this information or taking any action in reliance on or regarding this information. If you have received this fax in error, please notify us immediately by telephone so that we can arrange for its return to [place]. Phone: [phone], Toll-Free: [phone], Fax: [phone] Page: 2 of 2 Call Id: [id number] Guidelines Guideline Title Affirmed Question Affirmed Notes Nurse Date/Time Stephanie [name] Time) Abdominal Pain - Female [1] Vomiting AND [2] contains bile (green color) Stephanie Jews, RN, Stephanie [name] 10/26/2019 6:08:18 PM Disp. Time Stephanie [name] Time) Disposition Final User 10/26/2019 6:20:57 PM See HCP within 4 Hours (or PCP triage) Yes Stephanie Jews, RN, Stephanie [name] Disagree/Comply Comply Caller Understands Yes PreDisposition Call a family member Care Advice Given Per Guideline CARE ADVICE given per Abdominal Pain, Female (Adult) guideline SEE HCP WITHIN 4 HOURS (OR PCP TRIAGE): REST: Lie down and rest until seen. * IF OFFICE WILL BE CLOSED AND NO PCP (PRIMARY CARE PROVIDER) SECOND-LEVEL TRIAGE: You need to be seen within the next 3 or 4 hours. A nearby Urgent Care Center [facility]) is often a good source of care. Another choice is to go to the ED. Go sooner  if you become worse. NOTHING BY MOUTH: Do not eat or drink anything for now. CALL BACK IF: *  You become worse. CARE ADVICE given per Abdominal Pain, Female (Adult) guideline. Referrals [facility] - UC

## 2019-10-28 LAB — URINE CULTURE: Culture: <10000 — AB

## 2019-10-30 ENCOUNTER — Telehealth: Payer: Self-pay | Admitting: Primary Care

## 2019-10-30 DIAGNOSIS — R197 Diarrhea, unspecified: Secondary | ICD-10-CM

## 2019-10-30 NOTE — Telephone Encounter (Signed)
Spoke with the patient and she asked if you would refer her to [facility] that is what she prefers. Please place New GI Referral to [facility] .

## 2019-10-30 NOTE — Telephone Encounter (Signed)
Noted, referral placed.  

## 2019-11-10 ENCOUNTER — Other Ambulatory Visit: Payer: Self-pay

## 2019-11-10 ENCOUNTER — Emergency Department (HOSPITAL_COMMUNITY)
Admission: EM | Admit: 2019-11-10 | Discharge: 2019-11-10 | Discharge status: Absent without leave | Payer: No Typology Code available for payment source

## 2019-11-10 ENCOUNTER — Telehealth: Payer: Self-pay

## 2019-11-10 MED FILL — ONDANSETRON ODT 8 MG TABLET: 8 | 7 days supply | Qty: 20 | Fill #0

## 2019-11-10 NOTE — Telephone Encounter (Signed)
[facility] - Client TELEPHONE ADVICE RECORD [facility] Patient Name: [facility] Gender: Female DOB: 1996/01/28 Age: 23 Y 11 M 17 D Return Phone Number: [phone] (Primary) Address: [place] [address] Client [place] [facility] - Client Client Site [place] - Night Physician Alma Friendly - NP Contact Type Call Who Is Calling Patient / Member / Family / Caregiver Call Type Triage / Clinical Relationship To Patient Self Return Phone Number [phone] (Primary) Chief Complaint Vomiting Reason for Call Symptomatic / Request for [place] states that she is vomiting. Translation No Nurse Assessment Nurse: Waymon Budge, RN, Vaughan Basta Date/Time (Eastern Time): 11/10/2019 1:11:47 AM Confirm and document reason for call. If symptomatic, describe symptoms. ---Caller states that she is vomiting since Saturday. Has not been able to keep anything down. No urination since early this morning. Reports headache and sore throat. Reports tight pain in upper stomach with chest pains right before she vomits, goes away once she vomits. Also reports diarrhea, once today. No fever. Has the patient had close contact with a person known or suspected to have the novel coronavirus illness OR traveled / lives in area with major community spread (including international travel) in the last 14 days from the onset of symptoms? * If Asymptomatic, screen for exposure and travel within the last 14 days. ---No Does the patient have any new or worsening symptoms? ---Yes Will a triage be completed? ---Yes Related visit to physician within the last 2 weeks? ---No Does the PT have any chronic conditions? (i.e. diabetes, asthma, this includes High risk factors for pregnancy, etc.) ---Yes List chronic conditions. ---IBS Is the patient pregnant or possibly pregnant? (Ask all  females between the ages of 55-55) ---No Is this a behavioral health or substance abuse call? ---No Guidelines Guideline Title Affirmed Question Affirmed Notes Nurse Date/Time (Eastern Time) Vomiting [1] Drinking very little AND [2] dehydration suspected (e.g., no urine > Waymon Budge, RN, Vaughan Basta 11/10/2019 1:17:09 AM PLEASE NOTE: All timestamps contained within this report are represented as [place] Standard Time. CONFIDENTIALTY NOTICE: This fax transmission is intended only for the addressee. It contains information that is legally privileged, confidential or otherwise protected from use or disclosure. If you are not the intended recipient, you are strictly prohibited from reviewing, disclosing, copying using or disseminating any of this information or taking any action in reliance on or regarding this information. If you have received this fax in error, please notify us immediately by telephone so that we can arrange for its return to [place]. Phone: [phone], Toll-Free: [phone], Fax: [phone] Page: 2 of 2 Call Id: [id number] Guidelines Guideline Title Affirmed Question Affirmed Notes Nurse Date/Time Eilene Ghazi Time) 12 hours, very dry mouth, very lightheaded) Disp. Time Eilene Ghazi Time) Disposition Final User 11/10/2019 1:19:53 AM Go to ED Now (or PCP triage) Yes Waymon Budge, RN, Phineas Semen Disagree/Comply Comply Caller Understands Yes PreDisposition InappropriateToAsk Care Advice Given Per Guideline GO TO ED NOW (OR PCP TRIAGE): CARE ADVICE per Vomiting (Adult) guideline. Referrals [facility] - ED

## 2019-11-10 NOTE — Telephone Encounter (Signed)
Per chart review tab pt is at [facility]. 

## 2019-11-10 NOTE — Telephone Encounter (Signed)
Noted  

## 2019-11-11 ENCOUNTER — Encounter: Payer: Self-pay | Admitting: Primary Care

## 2019-11-11 ENCOUNTER — Telehealth: Payer: Self-pay

## 2019-11-11 ENCOUNTER — Ambulatory Visit (INDEPENDENT_AMBULATORY_CARE_PROVIDER_SITE_OTHER): Payer: No Typology Code available for payment source | Admitting: Primary Care

## 2019-11-11 DIAGNOSIS — R112 Nausea with vomiting, unspecified: Secondary | ICD-10-CM

## 2019-11-11 NOTE — Assessment & Plan Note (Addendum)
Chronic and recurrent, recent bout last week, she is recovering well overall. She is getting set up with GI for evaluation.   Low suspicion for Covid 19 given recurrent events and rapid improvement. Will hold off on Covid testing unless her employer is positive.

## 2019-11-11 NOTE — Telephone Encounter (Signed)
Patient's employer tested negative for Covid-19 today with rapid testing, she is pending another test. Given negative initial Covid-19 test we will wait to have the patient tested at this time. After speaking with patient her risk is low. She will update once her employer's second test returns.

## 2019-11-11 NOTE — Progress Notes (Signed)
Subjective:    Patient ID: Stephanie [name], female    DOB: [birth year], 23 y.o.   MRN: [id number]  HPI  Virtual Visit via Video Note  I connected with Stephanie [name] on 11/11/19 at  2:00 PM EST by a video enabled telemedicine application and verified that I am speaking with the correct person using two identifiers.  Location: Patient: Car, drive through line at [employer]  Provider: Office   I discussed the limitations of evaluation and management by telemedicine and the availability of in person appointments. The patient expressed understanding and agreed to proceed.  History of Present Illness:  Ms. Fabry is a 23 year old female with a history of near syncope, GAD, MDD, recurrent nausea and vomiting, pancreatitis who presents today with questions about Covid-19 testing.  She endorses vomiting over the last two days, unable to keep any food down, feeling much better now, has a mild headache from dehydration but is eating and drinking well. She is in the process of connecting with GI.  She was notified by her employer (she works as a Surveyor, minerals) yesterday that three people in the employer's office tested positive for Covid-19 (tested 6 days ago). Her employer went for testing yesterday, rapid Covid test negative, pending regular Covid testing now. The patient limits contact with her employer, doesn't wear a mask in the house when watching her employers son. The employer and patient practice social distancing when together and limit face to face contact.   She denies cough, shortness of breath, fevers, etc.    Observations/Objective:  Alert and oriented. Appears well, not sickly. No distress. Speaking in complete sentences.   Assessment and Plan:  Covid-19 Questions:  No interaction with positive cases, may have had indirect contact through her employer who has thus far tested negative. Given negative rapid Covid testing, coupled with no symptoms, and what  seems like lower risk for patient, we will defer testing until patient's employer's final result returns. If patient's employer is positive then will send patient for testing. Patient agrees.  Follow Up Instructions:  Please call me if your employer tests positive for Covid-19. Please call me if you develop symptoms of cough, loss of taste/smell, fevers, chills, body aches.   It was a pleasure to see you today! Allie Bossier, NP-C    I discussed the assessment and treatment plan with the patient. The patient was provided an opportunity to ask questions and all were answered. The patient agreed with the plan and demonstrated an understanding of the instructions.   The patient was advised to call back or seek an in-person evaluation if the symptoms worsen or if the condition fails to improve as anticipated.    Pleas Koch, NP    Review of Systems  Constitutional: Negative for chills, fatigue and fever.  Respiratory: Negative for cough and shortness of breath.   Neurological: Negative for dizziness and headaches.       Past Medical History:  Diagnosis Date  . Acute pancreatitis without infection or necrosis 04/21/2019  . Anemia   . Anxiety 01/28/2013   no meds  . Depression    no meds  . IBS (irritable bowel syndrome)   . Migraines    HA every other day - otc med prn  . Recurrent streptococcal tonsillitis   . Seasonal allergies    Seasonal environmental allergies  . Sebaceous cyst 08/04/2018   L mid back   . Vision abnormalities    Myopia; patient has history of  devloping ulcers when she wore contacts.     Social History   Socioeconomic History  . Marital status: Single    Spouse name: Not on file  . Number of children: Not on file  . Years of education: Not on file  . Highest education level: Not on file  Occupational History  . Not on file  Tobacco Use  . Smoking status: Never Smoker  . Smokeless tobacco: Never Used  Substance and Sexual Activity  .  Alcohol use: Never    Comment: socially  . Drug use: Yes    Types: Marijuana    Comment: Last use Thursday 10/16/18  . Sexual activity: Not Currently    Partners: Male    Birth control/protection: Implant    Comment: broke up with first sexual partner 1 month ago  Other Topics Concern  . Not on file  Social History Narrative   Single.    Student at [employer].   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    Enjoys listening to music, hanging out with friends.    Social Determinants of Health   Financial Resource Strain:   . Difficulty of Paying Living Expenses: Not on file  Food Insecurity:   . Worried About Programme researcher, broadcasting/film/video in the Last Year: Not on file  . Ran Out of Food in the Last Year: Not on file  Transportation Needs:   . Lack of Transportation (Medical): Not on file  . Lack of Transportation (Non-Medical): Not on file  Physical Activity:   . Days of Exercise per Week: Not on file  . Minutes of Exercise per Session: Not on file  Stress:   . Feeling of Stress : Not on file  Social Connections:   . Frequency of Communication with Friends and Family: Not on file  . Frequency of Social Gatherings with Friends and Family: Not on file  . Attends Religious Services: Not on file  . Active Member of Clubs or Organizations: Not on file  . Attends Banker Meetings: Not on file  . Marital Status: Not on file  Intimate Partner Violence:   . Fear of Current or Ex-Partner: Not on file  . Emotionally Abused: Not on file  . Physically Abused: Not on file  . Sexually Abused: Not on file    Past Surgical History:  Procedure Laterality Date  . COLONOSCOPY     IBS  . COLONOSCOPY     IBS  . LAPAROSCOPY N/A 10/20/2018   Procedure: LAPAROSCOPY DIAGNOSTIC possible removal of endometriosis;  Surgeon: Mitchel Honour, DO;  Location: [facility];  Service: Gynecology;  Laterality: N/A;    Family History  Problem Relation Age of Onset  . Depression Mother   . Migraines  Mother   . Migraines Father     Allergies  Allergen Reactions  . Coconut Flavor Other (See Comments)    Mouth itches, but no impaired breathing (per patient  . Septra [Sulfamethoxazole-Trimethoprim] Nausea Only and Other (See Comments)    Patient took a few courses of this and it ended up being stopped by a MD because it had started causing "bad stomach pain" (per the patient)    Current Outpatient Medications on File Prior to Visit  Medication Sig Dispense Refill  . acetaminophen (TYLENOL) 500 MG tablet Take 1,000 mg by mouth every 6 (six) hours as needed for mild pain.     Marland Kitchen dicyclomine (BENTYL) 20 MG tablet Take 1 tablet (20 mg total) by mouth 2 (two)  times daily. 20 tablet 0  . etonogestrel (NEXPLANON) 68 MG IMPL implant 1 each by Subdermal route once.    Marland Kitchen. ibuprofen (ADVIL) 200 MG tablet Take 200 mg by mouth every 6 (six) hours as needed for moderate pain.    . norgestimate-ethinyl estradiol (FEMYNOR) 0.25-35 MG-MCG tablet Take 1 tablet by mouth daily.    . ondansetron (ZOFRAN ODT) 8 MG disintegrating tablet Take 1 tablet (8 mg total) by mouth every 8 (eight) hours as needed for nausea or vomiting. (Patient not taking: Reported on 11/11/2019) 20 tablet 0  . [DISCONTINUED] metoCLOPramide (REGLAN) 10 MG tablet Take 1 tablet (10 mg total) by mouth every 6 (six) hours as needed for nausea or vomiting. (Patient not taking: Reported on 09/19/2019) 30 tablet 0   No current facility-administered medications on file prior to visit.    LMP 10/21/2019    Objective:   Physical Exam  Constitutional: She is oriented to person, place, and time. She appears well-nourished.  Respiratory: Effort normal.  No cough  Neurological: She is alert and oriented to person, place, and time.  Psychiatric: She has a normal mood and affect.           Assessment & Plan:

## 2019-11-11 NOTE — Telephone Encounter (Signed)
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 Pt wants to be tested for covid..Pt has no covid symptoms, no travel and no known exposure to + covid. Pt scheduled virtual visit with Gentry Fitz NP on 11/11/19 at 2 PM. Pt does not have a way to ck vitals. UC & ED precautions given and pt voiced understanding.

## 2019-11-11 NOTE — Patient Instructions (Signed)
  Please call me if your employer tests positive for Covid-19. Please call me if you develop symptoms of cough, loss of taste/smell, fevers, chills, body aches.   It was a pleasure to see you today! Allie Bossier, NP-C

## 2019-11-28 ENCOUNTER — Other Ambulatory Visit: Payer: Self-pay

## 2019-11-28 DIAGNOSIS — Z20822 Contact with and (suspected) exposure to covid-19: Secondary | ICD-10-CM

## 2019-11-29 LAB — NOVEL CORONAVIRUS, NAA: SARS-CoV-2, NAA: NOT DETECTED

## 2019-12-03 MED FILL — FEMYNOR 0.25-35 MG-MCG TABS: 0.25-35 | 28 days supply | Qty: 28 | Fill #0

## 2019-12-04 ENCOUNTER — Emergency Department (HOSPITAL_COMMUNITY)
Admission: EM | Admit: 2019-12-04 | Discharge: 2019-12-05 | Disposition: A | Discharge status: Home / Self Care | Payer: No Typology Code available for payment source | Attending: Emergency Medicine | Admitting: Emergency Medicine

## 2019-12-04 ENCOUNTER — Other Ambulatory Visit: Payer: Self-pay

## 2019-12-04 ENCOUNTER — Encounter (HOSPITAL_COMMUNITY): Payer: Self-pay | Admitting: Emergency Medicine

## 2019-12-04 DIAGNOSIS — R1084 Generalized abdominal pain: Secondary | ICD-10-CM | POA: Insufficient documentation

## 2019-12-04 DIAGNOSIS — N946 Dysmenorrhea, unspecified: Secondary | ICD-10-CM | POA: Insufficient documentation

## 2019-12-04 DIAGNOSIS — Z793 Long term (current) use of hormonal contraceptives: Secondary | ICD-10-CM | POA: Diagnosis not present

## 2019-12-04 DIAGNOSIS — Z79899 Other long term (current) drug therapy: Secondary | ICD-10-CM | POA: Diagnosis not present

## 2019-12-04 DIAGNOSIS — R112 Nausea with vomiting, unspecified: Secondary | ICD-10-CM | POA: Insufficient documentation

## 2019-12-04 MED ORDER — FAMOTIDINE IN NACL 20-0.9 MG/50ML-% IV SOLN
20.0000 mg | Freq: Once | INTRAVENOUS | Status: AC
  Administered 2019-12-05: 20 mg via INTRAVENOUS
  Filled 2019-12-04: qty 50

## 2019-12-04 MED ORDER — SODIUM CHLORIDE 0.9% FLUSH
3.0000 mL | Freq: Once | INTRAVENOUS | Status: AC
  Administered 2019-12-05: 3 mL via INTRAVENOUS

## 2019-12-04 MED ORDER — ONDANSETRON 4 MG PO TBDP: 4.0000 mg | ORAL_TABLET | Freq: Once | ORAL | Status: DC | PRN

## 2019-12-04 MED ORDER — SODIUM CHLORIDE 0.9 % IV BOLUS
1000.0000 mL | Freq: Once | INTRAVENOUS | Status: AC
  Administered 2019-12-05: 1000 mL via INTRAVENOUS

## 2019-12-04 MED ORDER — ONDANSETRON HCL 4 MG/2ML IJ SOLN
4.0000 mg | Freq: Once | INTRAMUSCULAR | Status: AC
  Administered 2019-12-05: 4 mg via INTRAVENOUS
  Filled 2019-12-04: qty 2

## 2019-12-04 MED ORDER — KETOROLAC TROMETHAMINE 30 MG/ML IJ SOLN
30.0000 mg | Freq: Once | INTRAMUSCULAR | Status: AC
  Administered 2019-12-05: 30 mg via INTRAVENOUS
  Filled 2019-12-04: qty 1

## 2019-12-04 NOTE — ED Provider Notes (Signed)
[facility] DEPT Provider Note   CSN: [id number] Arrival date & time: 12/04/19  2251     History Chief Complaint  Patient presents with  . Abdominal Pain    Stephanie [name] is a 24 y.o. female.   24 year old female with a history of IBS presents to the emergency department for complaints of abdominal pain and nausea/vomiting.  Her symptoms have been ongoing over the past 2 days.  She has a history of vomiting during her menses.  Reports that she is currently on her menstrual cycle which began on Sunday.  It usually lasts about a week.  Her abdominal pain is generalized, cramping, intermittent.  Her vomiting has worsened over the last 24 hours with more than 6 episodes of green emesis today.  She has been taking Midol, Tylenol, Zofran without relief.  Reports some associated lightheadedness and generalized weakness.  Dnies fever, bowel changes, urinary symptoms, syncope.  Abdominal surgical history significant for diagnostic laparoscopy.  The history is provided by the patient. No language interpreter was used.  Abdominal Pain      Past Medical History:  Diagnosis Date  . Acute pancreatitis without infection or necrosis 04/21/2019  . Anemia   . Anxiety 01/28/2013   no meds  . Depression    no meds  . IBS (irritable bowel syndrome)   . Migraines    HA every other day - otc med prn  . Recurrent streptococcal tonsillitis   . Seasonal allergies    Seasonal environmental allergies  . Sebaceous cyst 08/04/2018   L mid back   . Vision abnormalities    Myopia; patient has history of devloping ulcers when she wore contacts.    Patient Active Problem List   Diagnosis Date Noted  . Flank pain 07/10/2019  . Near syncope 06/05/2019  . Nausea and vomiting 04/20/2019  . Axillary hidradenitis suppurativa 03/05/2019  . Recurrent infection of skin 02/04/2019  . Preventative health care 11/20/2017  . Menorrhagia with regular cycle 09/24/2014  .  Migraines 08/04/2014  . Allergic rhinitis 04/03/2013  . MDD (major depressive disorder), single episode, severe ([place]) 01/28/2013    Class: Chronic  . Generalized anxiety disorder 01/28/2013    Past Surgical History:  Procedure Laterality Date  . COLONOSCOPY     IBS  . COLONOSCOPY     IBS  . LAPAROSCOPY N/A 10/20/2018   Procedure: LAPAROSCOPY DIAGNOSTIC possible removal of endometriosis;  Surgeon: Linda Hedges, DO;  Location: [facility];  Service: Gynecology;  Laterality: N/A;     OB History    Gravida  0   Para  0   Term  0   Preterm  0   AB  0   Living  0     SAB  0   TAB  0   Ectopic  0   Multiple  0   Live Births  0        Obstetric Comments  Menstrual age: 49  Age 1st Pregnancy: N/A         Family History  Problem Relation Age of Onset  . Depression Mother   . Migraines Mother   . Migraines Father     Social History   Tobacco Use  . Smoking status: Never Smoker  . Smokeless tobacco: Never Used  Substance Use Topics  . Alcohol use: Never    Comment: socially  . Drug use: Yes    Types: Marijuana    Comment: Last use Thursday 10/16/18  Home Medications Prior to Admission medications   Medication Sig Start Date End Date Taking? Authorizing Provider  acetaminophen (TYLENOL) 500 MG tablet Take 1,000 mg by mouth every 6 (six) hours as needed for mild pain.    Yes [provider]  etonogestrel (NEXPLANON) 68 MG IMPL implant 1 each by Subdermal route once. 09/30/18  Yes [provider]  ibuprofen (ADVIL) 200 MG tablet Take 200-400 mg by mouth every 6 (six) hours as needed for moderate pain.    Yes [provider]  norgestimate-ethinyl estradiol [facility]) 0.25-35 MG-MCG tablet Take 1 tablet by mouth daily.   Yes [provider]  ondansetron (ZOFRAN ODT) 8 MG disintegrating tablet Take 1 tablet (8 mg total) by mouth every 8 (eight) hours as needed for nausea or vomiting. 10/26/19  Yes Joy, Shawn C, PA-C    dicyclomine (BENTYL) 20 MG tablet Take 1 tablet (20 mg total) by mouth 2 (two) times daily. Patient not taking: Reported on 12/05/2019 10/26/19   Anselm Pancoast, PA-C  promethazine (PHENERGAN) 25 MG suppository Place 1 suppository (25 mg total) rectally every 6 (six) hours as needed for nausea or vomiting. 12/05/19   Antony Madura, PA-C  metoCLOPramide (REGLAN) 10 MG tablet Take 1 tablet (10 mg total) by mouth every 6 (six) hours as needed for nausea or vomiting. Patient not taking: Reported on 09/19/2019 06/19/19 09/19/19  Caccavale, Sophia, PA-C    Allergies    Coconut flavor and Septra [sulfamethoxazole-trimethoprim]  Review of Systems   Review of Systems  Gastrointestinal: Positive for abdominal pain.  Ten systems reviewed and are negative for acute change, except as noted in the HPI.    Physical Exam Updated Vital Signs BP 119/79 (BP Location: Left Arm)   Pulse 96   Temp 99 F (37.2 C) (Oral)   Resp 16   Ht 5' 2.5" (1.588 m)   Wt 55.8 kg   LMP 11/29/2019   SpO2 100%   BMI 22.14 kg/m   Physical Exam Vitals and nursing note reviewed.  Constitutional:      General: She is not in acute distress.    Appearance: She is well-developed. She is not diaphoretic.     Comments: Nontoxic appearing and in NAD  HENT:     Head: Normocephalic and atraumatic.  Eyes:     General: No scleral icterus.    Conjunctiva/sclera: Conjunctivae normal.  Cardiovascular:     Rate and Rhythm: Normal rate and regular rhythm.     Pulses: Normal pulses.  Pulmonary:     Effort: Pulmonary effort is normal. No respiratory distress.     Comments: Respirations even and unlabored Abdominal:     Palpations: Abdomen is soft. There is no mass.     Tenderness: There is abdominal tenderness.     Comments: Mild, generalized tenderness.  No guarding or palpable masses.  No peritoneal signs.  Musculoskeletal:        General: Normal range of motion.     Cervical back: Normal range of motion.  Skin:    General:  Skin is warm and dry.     Coloration: Skin is not pale.     Findings: No erythema or rash.  Neurological:     General: No focal deficit present.     Mental Status: She is alert and oriented to person, place, and time.     Coordination: Coordination normal.  Psychiatric:        Behavior: Behavior normal.     ED Results / Procedures /  Treatments   Labs (all labs ordered are listed, but only abnormal results are displayed) Labs Reviewed  COMPREHENSIVE METABOLIC PANEL - Abnormal; Notable for the following components:      Result Value   Glucose, Bld 116 (*)    Total Protein 8.9 (*)    Alkaline Phosphatase 37 (*)    All other components within normal limits  LIPASE, BLOOD  CBC  I-STAT BETA HCG BLOOD, ED ([facility], [facility], [facility] ONLY)    EKG None  Radiology No results found.  Procedures Procedures (including critical care time)  Medications Ordered in ED Medications  sodium chloride flush (NS) 0.9 % injection 3 mL (3 mLs Intravenous Given 12/05/19 0015)  sodium chloride 0.9 % bolus 1,000 mL (0 mLs Intravenous Stopped 12/05/19 0123)  famotidine (PEPCID) IVPB 20 mg premix (0 mg Intravenous Stopped 12/05/19 0044)  ondansetron (ZOFRAN) injection 4 mg (4 mg Intravenous Given 12/05/19 0014)  ketorolac (TORADOL) 30 MG/ML injection 30 mg (30 mg Intravenous Given 12/05/19 0014)  promethazine (PHENERGAN) injection 12.5 mg (12.5 mg Intravenous Given 12/05/19 0129)    ED Course  I have reviewed the triage vital signs and the nursing notes.  Pertinent labs & imaging results that were available during my care of the patient were reviewed by me and considered in my medical decision making (see chart for details).   1:14 AM Patient noting mild nausea, but overall stating she feels "way better". Will give IV phenergan and PO challenge.     MDM Rules/Calculators/A&P                      24 year old female presents to the emergency department for abdominal cramping with nausea and vomiting.  She  reports similar symptoms in the setting of her menstrual cycle.  Menses began 5 days ago and she has had worsening emesis x48 hours, uncontrolled with home Zofran.  The patient has presented to the ED multiple times in the past for these complaints.  Within the past 6 months she has had 2 unremarkable abdominal CT scans.  Her laboratory evaluation is at baseline.  Symptoms have improved with IV fluids as well as IV antiemetics.  She is now tolerating ice chips.  Repeat exam remains stable.  Do not feel further emergent work-up or imaging is presently indicated.  Will discharge with short course of Phenergan suppositories to use PRN.  Return precautions discussed and provided. Patient discharged in stable condition with no unaddressed concerns.   Final Clinical Impression(s) / ED Diagnoses Final diagnoses:  Non-intractable vomiting with nausea, unspecified vomiting type  Dysmenorrhea    Rx / DC Orders ED Discharge Orders         Ordered    promethazine (PHENERGAN) 25 MG suppository  Every 6 hours PRN     12/05/19 0211           Antony Madura, PA-C 12/05/19 0224    Zadie Rhine, MD 12/05/19 [phone]

## 2019-12-04 NOTE — ED Triage Notes (Signed)
Patient is complaining of upper abdominal pain and vomiting. Patient states this started this morning. Patient states she is having hot flashes and dizziness.

## 2019-12-05 LAB — COMPREHENSIVE METABOLIC PANEL
ALT: 16 U/L (ref 0–44)
AST: 24 U/L (ref 15–41)
Albumin: 4.7 g/dL (ref 3.5–5.0)
Alkaline Phosphatase: 37 U/L — ABNORMAL LOW (ref 38–126)
Anion gap: 12 (ref 5–15)
BUN: 14 mg/dL (ref 6–20)
CO2: 23 mmol/L (ref 22–32)
Calcium: 9.6 mg/dL (ref 8.9–10.3)
Chloride: 104 mmol/L (ref 98–111)
Creatinine, Ser: 0.78 mg/dL (ref 0.44–1.00)
GFR calc Af Amer: >60 mL/min (ref >60)
GFR calc non Af Amer: >60 mL/min (ref >60)
Glucose, Bld: 116 mg/dL — ABNORMAL HIGH (ref 70–99)
Potassium: 3.9 mmol/L (ref 3.5–5.1)
Sodium: 139 mmol/L (ref 135–145)
Total Bilirubin: 0.8 mg/dL (ref 0.3–1.2)
Total Protein: 8.9 g/dL — ABNORMAL HIGH (ref 6.5–8.1)

## 2019-12-05 LAB — CBC
HCT: 42.5 % (ref 36.0–46.0)
Hemoglobin: 14 g/dL (ref 12.0–15.0)
MCH: 29.5 pg (ref 26.0–34.0)
MCHC: 32.9 g/dL (ref 30.0–36.0)
MCV: 89.7 fL (ref 80.0–100.0)
Platelets: 319 10*3/uL (ref 150–400)
RBC: 4.74 MIL/uL (ref 3.87–5.11)
RDW: 12.7 % (ref 11.5–15.5)
WBC: 5.9 10*3/uL (ref 4.0–10.5)
nRBC: 0 % (ref 0.0–0.2)

## 2019-12-05 LAB — I-STAT BETA HCG BLOOD, ED (MC, WL, AP ONLY): I-stat hCG, quantitative: <5 m[IU]/mL (ref <5)

## 2019-12-05 LAB — LIPASE, BLOOD: Lipase: 41 U/L (ref 11–51)

## 2019-12-05 MED ORDER — PROMETHAZINE HCL 25 MG/ML IJ SOLN
12.5000 mg | INTRAMUSCULAR | Status: AC
  Administered 2019-12-05: 12.5 mg via INTRAVENOUS
  Filled 2019-12-05: qty 1

## 2019-12-05 MED ORDER — PROMETHAZINE HCL 25 MG RE SUPP: 25.0000 mg | Freq: Four times a day (QID) | RECTAL | 0 refills | Status: DC | PRN

## 2019-12-05 NOTE — Discharge Instructions (Signed)
Continue a clear liquid diet until nausea and vomiting subsides.  You may continue taking Zofran as previously prescribed by your doctor.  You were given an additional prescription for Phenergan suppositories to use when unable to tolerate fluids or medications by mouth.  We advise continued follow-up with your primary care doctor and OB/GYN.  Return for any new or concerning symptoms.

## 2019-12-06 ENCOUNTER — Emergency Department (HOSPITAL_COMMUNITY)
Admission: EM | Admit: 2019-12-06 | Discharge: 2019-12-06 | Disposition: A | Discharge status: Home / Self Care | Payer: No Typology Code available for payment source | Attending: Emergency Medicine | Admitting: Emergency Medicine

## 2019-12-06 ENCOUNTER — Other Ambulatory Visit: Payer: Self-pay

## 2019-12-06 ENCOUNTER — Encounter (HOSPITAL_COMMUNITY): Payer: Self-pay | Admitting: Emergency Medicine

## 2019-12-06 DIAGNOSIS — K859 Acute pancreatitis without necrosis or infection, unspecified: Secondary | ICD-10-CM | POA: Diagnosis not present

## 2019-12-06 DIAGNOSIS — R112 Nausea with vomiting, unspecified: Secondary | ICD-10-CM | POA: Diagnosis present

## 2019-12-06 DIAGNOSIS — R1013 Epigastric pain: Secondary | ICD-10-CM | POA: Insufficient documentation

## 2019-12-06 DIAGNOSIS — Z79899 Other long term (current) drug therapy: Secondary | ICD-10-CM | POA: Insufficient documentation

## 2019-12-06 LAB — COMPREHENSIVE METABOLIC PANEL
ALT: 15 U/L (ref 0–44)
AST: 20 U/L (ref 15–41)
Albumin: 3.6 g/dL (ref 3.5–5.0)
Alkaline Phosphatase: 32 U/L — ABNORMAL LOW (ref 38–126)
Anion gap: 8 (ref 5–15)
BUN: 11 mg/dL (ref 6–20)
CO2: 25 mmol/L (ref 22–32)
Calcium: 8.8 mg/dL — ABNORMAL LOW (ref 8.9–10.3)
Chloride: 107 mmol/L (ref 98–111)
Creatinine, Ser: 0.92 mg/dL (ref 0.44–1.00)
GFR calc Af Amer: >60 mL/min (ref >60)
GFR calc non Af Amer: >60 mL/min (ref >60)
Glucose, Bld: 95 mg/dL (ref 70–99)
Potassium: 3.7 mmol/L (ref 3.5–5.1)
Sodium: 140 mmol/L (ref 135–145)
Total Bilirubin: 0.5 mg/dL (ref 0.3–1.2)
Total Protein: 7 g/dL (ref 6.5–8.1)

## 2019-12-06 LAB — CBC WITH DIFFERENTIAL/PLATELET
Abs Immature Granulocytes: 0.01 10*3/uL (ref 0.00–0.07)
Basophils Absolute: 0 10*3/uL (ref 0.0–0.1)
Basophils Relative: 0 %
Eosinophils Absolute: 0 10*3/uL (ref 0.0–0.5)
Eosinophils Relative: 0 %
HCT: 39.4 % (ref 36.0–46.0)
Hemoglobin: 12.6 g/dL (ref 12.0–15.0)
Immature Granulocytes: 0 %
Lymphocytes Relative: 21 %
Lymphs Abs: 1.1 10*3/uL (ref 0.7–4.0)
MCH: 29.2 pg (ref 26.0–34.0)
MCHC: 32 g/dL (ref 30.0–36.0)
MCV: 91.2 fL (ref 80.0–100.0)
Monocytes Absolute: 0.2 10*3/uL (ref 0.1–1.0)
Monocytes Relative: 5 %
Neutro Abs: 3.7 10*3/uL (ref 1.7–7.7)
Neutrophils Relative %: 74 %
Platelets: 282 10*3/uL (ref 150–400)
RBC: 4.32 MIL/uL (ref 3.87–5.11)
RDW: 12.9 % (ref 11.5–15.5)
WBC: 5 10*3/uL (ref 4.0–10.5)
nRBC: 0 % (ref 0.0–0.2)

## 2019-12-06 LAB — URINALYSIS, ROUTINE W REFLEX MICROSCOPIC
Bacteria, UA: NONE SEEN
Bilirubin Urine: NEGATIVE
Glucose, UA: NEGATIVE mg/dL
Ketones, ur: 20 mg/dL — AB
Leukocytes,Ua: NEGATIVE
Nitrite: NEGATIVE
Protein, ur: NEGATIVE mg/dL
Specific Gravity, Urine: 1.02 (ref 1.005–1.030)
pH: 6 (ref 5.0–8.0)

## 2019-12-06 LAB — LIPASE, BLOOD: Lipase: 163 U/L — ABNORMAL HIGH (ref 11–51)

## 2019-12-06 LAB — I-STAT BETA HCG BLOOD, ED (MC, WL, AP ONLY): I-stat hCG, quantitative: <5 m[IU]/mL (ref <5)

## 2019-12-06 MED ORDER — SODIUM CHLORIDE 0.9 % IV BOLUS
500.0000 mL | Freq: Once | INTRAVENOUS | Status: AC
  Administered 2019-12-06: 500 mL via INTRAVENOUS

## 2019-12-06 MED ORDER — ALUM & MAG HYDROXIDE-SIMETH 200-200-20 MG/5ML PO SUSP
30.0000 mL | Freq: Once | ORAL | Status: AC
  Administered 2019-12-06: 12:00:00 30 mL via ORAL
  Filled 2019-12-06: qty 30

## 2019-12-06 MED ORDER — FAMOTIDINE IN NACL 20-0.9 MG/50ML-% IV SOLN
20.0000 mg | Freq: Once | INTRAVENOUS | Status: AC
  Administered 2019-12-06: 12:00:00 20 mg via INTRAVENOUS
  Filled 2019-12-06: qty 50

## 2019-12-06 MED ORDER — PROMETHAZINE HCL 25 MG RE SUPP: 25.0000 mg | Freq: Three times a day (TID) | RECTAL | 0 refills | Status: DC | PRN

## 2019-12-06 MED ORDER — METOCLOPRAMIDE HCL 5 MG/ML IJ SOLN
10.0000 mg | Freq: Once | INTRAMUSCULAR | Status: AC
  Administered 2019-12-06: 10 mg via INTRAVENOUS
  Filled 2019-12-06: qty 2

## 2019-12-06 MED ORDER — PROMETHAZINE HCL 25 MG/ML IJ SOLN
12.5000 mg | Freq: Once | INTRAMUSCULAR | Status: AC
  Administered 2019-12-06: 12.5 mg via INTRAVENOUS
  Filled 2019-12-06: qty 1

## 2019-12-06 MED ORDER — SUCRALFATE 1 GM/10ML PO SUSP
1.0000 g | Freq: Three times a day (TID) | ORAL | Status: DC
  Administered 2019-12-06: 13:00:00 1 g via ORAL
  Filled 2019-12-06 (×3): qty 10

## 2019-12-06 NOTE — ED Triage Notes (Signed)
Pt reports abdominal pain and vomiting, seen at [facility] a few days ago for the same. Hx of vomiting during menstrual cycle.

## 2019-12-06 NOTE — ED Notes (Signed)
Had labs done yesterday morning at [facility].

## 2019-12-06 NOTE — ED Notes (Signed)
Provided pt water for fluid challenge

## 2019-12-06 NOTE — ED Provider Notes (Signed)
[facility] EMERGENCY DEPARTMENT Provider Note   CSN: [id number] Arrival date & time: 12/06/19  0940     History Chief Complaint  Patient presents with  . Emesis    Stephanie [name] is a 24 y.o. female.  HPI   Pt is a 24 y/o female with a h/o pancreatitis, anemia, anxiety/depression, who presents to the ED today for eval of nausea and vomiting that started about 4 days ago.  Today she has vomited about 6 times. She had diarrhea a few days ago that has since resolved. Denies blood stools.  She is c/o abd pain in the epigastrium. Pain is constant and rated at 8/10. States pain feels like a squeezing pain. Denies fevers, dysuria, frequency, or urgency.   States that she seems to have similar sxs when she is on her menstrual cycle but this month sxs seemed worse so she came to the ED. She has zofran at home which has not resolved her symptoms. Denies h/o ETOH use or tobacco use.     Past Medical History:  Diagnosis Date  . Acute pancreatitis without infection or necrosis 04/21/2019  . Anemia   . Anxiety 01/28/2013   no meds  . Depression    no meds  . IBS (irritable bowel syndrome)   . Migraines    HA every other day - otc med prn  . Recurrent streptococcal tonsillitis   . Seasonal allergies    Seasonal environmental allergies  . Sebaceous cyst 08/04/2018   L mid back   . Vision abnormalities    Myopia; patient has history of devloping ulcers when she wore contacts.    Patient Active Problem List   Diagnosis Date Noted  . Flank pain 07/10/2019  . Near syncope 06/05/2019  . Nausea and vomiting 04/20/2019  . Axillary hidradenitis suppurativa 03/05/2019  . Recurrent infection of skin 02/04/2019  . Preventative health care 11/20/2017  . Menorrhagia with regular cycle 09/24/2014  . Migraines 08/04/2014  . Allergic rhinitis 04/03/2013  . MDD (major depressive disorder), single episode, severe ([facility]) 01/28/2013    Class: Chronic  . Generalized anxiety  disorder 01/28/2013    Past Surgical History:  Procedure Laterality Date  . COLONOSCOPY     IBS  . COLONOSCOPY     IBS  . LAPAROSCOPY N/A 10/20/2018   Procedure: LAPAROSCOPY DIAGNOSTIC possible removal of endometriosis;  Surgeon: Mitchel Honour, DO;  Location: [facility];  Service: Gynecology;  Laterality: N/A;     OB History    Gravida  0   Para  0   Term  0   Preterm  0   AB  0   Living  0     SAB  0   TAB  0   Ectopic  0   Multiple  0   Live Births  0        Obstetric Comments  Menstrual age: 40  Age 1st Pregnancy: N/A         Family History  Problem Relation Age of Onset  . Depression Mother   . Migraines Mother   . Migraines Father     Social History   Tobacco Use  . Smoking status: Never Smoker  . Smokeless tobacco: Never Used  Substance Use Topics  . Alcohol use: Never    Comment: socially  . Drug use: Yes    Types: Marijuana    Comment: Last use Thursday 10/16/18    Home Medications Prior to Admission medications   Medication  Sig Start Date End Date Taking? Authorizing Provider  acetaminophen (TYLENOL) 500 MG tablet Take 1,000 mg by mouth every 6 (six) hours as needed for mild pain.     [provider]  dicyclomine (BENTYL) 20 MG tablet Take 1 tablet (20 mg total) by mouth 2 (two) times daily. Patient not taking: Reported on 12/05/2019 10/26/19   Lorayne Bender, PA-C  etonogestrel (NEXPLANON) 68 MG IMPL implant 1 each by Subdermal route once. 09/30/18   [provider]  ibuprofen (ADVIL) 200 MG tablet Take 200-400 mg by mouth every 6 (six) hours as needed for moderate pain.     [provider]  norgestimate-ethinyl estradiol [facility]) 0.25-35 MG-MCG tablet Take 1 tablet by mouth daily.    [provider]  ondansetron (ZOFRAN ODT) 8 MG disintegrating tablet Take 1 tablet (8 mg total) by mouth every 8 (eight) hours as needed for nausea or vomiting. 10/26/19   Joy, Shawn C, PA-C  promethazine (PHENERGAN) 25 MG  suppository Place 1 suppository (25 mg total) rectally every 8 (eight) hours as needed for nausea or vomiting. 12/06/19   Donold Marotto S, PA-C  metoCLOPramide (REGLAN) 10 MG tablet Take 1 tablet (10 mg total) by mouth every 6 (six) hours as needed for nausea or vomiting. Patient not taking: Reported on 09/19/2019 06/19/19 09/19/19  Caccavale, Sophia, PA-C    Allergies    Coconut flavor and Septra [sulfamethoxazole-trimethoprim]  Review of Systems   Review of Systems  Constitutional: Negative for fever.  HENT: Negative for ear pain and sore throat.   Eyes: Negative for visual disturbance.  Respiratory: Negative for cough and shortness of breath.   Cardiovascular: Negative for chest pain.  Gastrointestinal: Positive for abdominal pain, nausea and vomiting. Negative for constipation and diarrhea.  Genitourinary: Negative for dysuria and hematuria.  Musculoskeletal: Negative for back pain.  Skin: Negative for rash.  Neurological: Negative for headaches.  All other systems reviewed and are negative.   Physical Exam Updated Vital Signs BP 128/66 (BP Location: Left Arm)   Pulse (!) 106   Temp 98.7 F (37.1 C) (Oral)   Resp 18   LMP 11/29/2019   SpO2 100%   Physical Exam Vitals and nursing note reviewed.  Constitutional:      General: She is not in acute distress.    Appearance: She is well-developed.  HENT:     Head: Normocephalic and atraumatic.  Eyes:     Conjunctiva/sclera: Conjunctivae normal.  Cardiovascular:     Rate and Rhythm: Normal rate and regular rhythm.     Heart sounds: Normal heart sounds. No murmur.  Pulmonary:     Effort: Pulmonary effort is normal. No respiratory distress.     Breath sounds: Normal breath sounds. No wheezing, rhonchi or rales.  Abdominal:     General: Bowel sounds are normal.     Palpations: Abdomen is soft.     Tenderness: There is abdominal tenderness in the epigastric area. There is no right CVA tenderness, left CVA tenderness,  guarding or rebound.  Musculoskeletal:     Cervical back: Neck supple.  Skin:    General: Skin is warm and dry.  Neurological:     Mental Status: She is alert.     ED Results / Procedures / Treatments   Labs (all labs ordered are listed, but only abnormal results are displayed) Labs Reviewed  COMPREHENSIVE METABOLIC PANEL - Abnormal; Notable for the following components:      Result Value   Calcium 8.8 (*)  Alkaline Phosphatase 32 (*)    All other components within normal limits  LIPASE, BLOOD - Abnormal; Notable for the following components:   Lipase 163 (*)    All other components within normal limits  URINALYSIS, ROUTINE W REFLEX MICROSCOPIC - Abnormal; Notable for the following components:   Hgb urine dipstick SMALL (*)    Ketones, ur 20 (*)    All other components within normal limits  CBC WITH DIFFERENTIAL/PLATELET  I-STAT BETA HCG BLOOD, ED ([facility], [facility], [facility] ONLY)    EKG None  Radiology No results found.  Procedures Procedures (including critical care time)  Medications Ordered in ED Medications  sucralfate (CARAFATE) 1 GM/10ML suspension 1 g (1 g Oral Given 12/06/19 1239)  promethazine (PHENERGAN) injection 12.5 mg (has no administration in time range)  sodium chloride 0.9 % bolus 500 mL (500 mLs Intravenous New Bag/Given 12/06/19 1153)  metoCLOPramide (REGLAN) injection 10 mg (10 mg Intravenous Given 12/06/19 1140)  alum & mag hydroxide-simeth (MAALOX/MYLANTA) 200-200-20 MG/5ML suspension 30 mL (30 mLs Oral Given 12/06/19 1143)  famotidine (PEPCID) IVPB 20 mg premix (0 mg Intravenous Stopped 12/06/19 1238)    ED Course  I have reviewed the triage vital signs and the nursing notes.  Pertinent labs & imaging results that were available during my care of the patient were reviewed by me and considered in my medical decision making (see chart for details).    MDM Rules/Calculators/A&P                     24 year old female presenting for evaluation of epigastric  abdominal pain nausea and vomiting for the last several days.  Denies any fevers or urinary complaints.  No vaginal complaints.  She is initially marginally tachycardic but is afebrile and otherwise has normal vitals.  On exam, she does have some mild epigastric tenderness.  No peritoneal signs.  CMP is without leukocytosis or anemia CMP without gross electrolyte derangement.  Normal kidney and liver function Lipase is elevated at 163 which is increased from yesterday when it was normal at 41. Beta-hCG is negative UA is negative  Patient was given IV fluids, antiemetics, Carafate, Pepcid and a GI cocktail.  On reassessment she states that her pain is improved.  She has been able to tolerate p.o.  Suspect symptoms related to pancreatitis, low suspicion for other emergent etiology at this time.  Will give Rx for Entereg and suppositories at home.  Advised Tylenol/Motrin for pain.  Also advised on dietary recommendations.  Advised PCP follow-up and return precautions.  She voices understanding of the plan and reasons to return.  All questions answered.  Patient stable for discharge.   Final Clinical Impression(s) / ED Diagnoses Final diagnoses:  Acute pancreatitis, unspecified complication status, unspecified pancreatitis type    Rx / DC Orders ED Discharge Orders         Ordered    promethazine (PHENERGAN) 25 MG suppository  Every 8 hours PRN     12/06/19 1419           Catalina Salasar S, PA-C 12/06/19 1423    Virgina Norfolk, DO 12/06/19 1512

## 2019-12-06 NOTE — Discharge Instructions (Addendum)
Take nausea medication as directed. Do not drive after taking this medication as it can make you sleeping.   Please follow up with your primary care provider within 5-7 days for re-evaluation of your symptoms. If you do not have a primary care provider, information for a healthcare clinic has been provided for you to make arrangements for follow up care. Please return to the emergency department for any new or worsening symptoms.

## 2019-12-07 ENCOUNTER — Telehealth: Payer: Self-pay

## 2019-12-07 MED FILL — BLISOVI FE 1/20 1-20 MG-MCG: 1-20 | 84 days supply | Qty: 84 | Fill #0

## 2019-12-07 MED FILL — PROMETHAZINE HCL 25 MG SUPP: 25 | 2 days supply | Qty: 6 | Fill #0

## 2019-12-07 NOTE — Telephone Encounter (Signed)
Per chart review pt went to [facility] ED on 12/04/19 and [place] on 12/06/19. Allayne Gitelman NP out of office 12/07/19. Will send to Mayra Reel NP as Lorain Childes and to Harlin Heys FNP who is in office and [place] CMA.

## 2019-12-07 NOTE — Telephone Encounter (Signed)
[facility] Night - Client TELEPHONE ADVICE RECORD [facility] Patient Name: Stephanie [name] NE Gender: Female DOB: 06/22/1996 Age: 24 Y 11 D Return Phone Number: [phone] (Primary), [phone] (Secondary) Address[name] [address] Client Stephanie [name] Physician Stephanie [name] - NP Contact Type Call Who Is Calling Patient / Member / Family / Caregiver Call Type Triage / Clinical Relationship To Patient Self Return Phone Number [phone] (Primary) Chief Complaint ABDOMINAL PAIN - Severe and only in abdomen Reason for Call Symptomatic / Request for Mena states she has been sick today. She is having nausea, vomiting, & severe stomach pain/ discomfort. No blood in vomit. She does not know if she has had urine output within the last 8 hours. Translation No Nurse Assessment Nurse: Stephanie Post, RN, Stephanie [name] Date/Time Stephanie [name] Time): 12/04/2019 10:21:08 PM Confirm and document reason for call. If symptomatic, describe symptoms. ---Caller states that she is having nausea, vomiting, and severe abdominal pain/discomfort. 8/10 on pain scale. No blood in vomit. Over six episodes of vomiting. 1-2 bouts of diarrhea. No fever. This has been happening every cycle for the past year. Has the patient had close contact with a person known or suspected to have the novel coronavirus illness OR traveled / lives in area with major community spread (including international travel) in the last 14 days from the onset of symptoms? * If Asymptomatic, screen for exposure and travel within the last 14 days. ---No Does the patient have any new or worsening symptoms? ---Yes Will a triage be completed? ---Yes Related visit to physician within the last 2 weeks? ---No Does the PT have any chronic conditions? (i.e. diabetes, asthma,  this includes High risk factors for pregnancy, etc.) ---Yes List chronic conditions. ---IBS Is the patient pregnant or possibly pregnant? (Ask all females between the ages of 69-55) ---No Is this a behavioral health or substance abuse call? ---No PLEASE NOTE: All timestamps contained within this report are represented as [place] Standard Time. CONFIDENTIALTY NOTICE: This fax transmission is intended only for the addressee. It contains information that is legally privileged, confidential or otherwise protected from use or disclosure. If you are not the intended recipient, you are strictly prohibited from reviewing, disclosing, copying using or disseminating any of this information or taking any action in reliance on or regarding this information. If you have received this fax in error, please notify us immediately by telephone so that we can arrange for its return to [place]. Phone: [phone], Toll-Free: [phone], Fax: [phone] Page: 2 of 2 Call Id: [id number] Guidelines Guideline Title Affirmed Question Affirmed Notes Nurse Date/Time Stephanie [name] Time) Abdominal Pain - Female [1] SEVERE pain (e.g., excruciating) AND [2] present > 1 hour Stephanie [name] 12/04/2019 10:25:47 PM Disp. Time Stephanie [name] Time) Disposition Final User 12/04/2019 10:19:39 PM Send to Urgent Stephanie [name], Stephanie [name] 12/04/2019 10:28:36 PM Go to ED Now Yes Stephanie Post, RN, Stephanie [name] Disagree/Comply Comply Caller Understands Yes PreDisposition Go to ED Care Advice Given Per Guideline GO TO ED NOW: * You need to be seen in the Emergency Department. * Go to the ED at ___________ [place]: * It is better and safer if another adult drives instead of you. BRING MEDICINES: * Please bring a list of your current medicines when you go to the Emergency Department (ER). * It is also a good idea to bring the pill bottles too. This will help  the doctor to make certain you are taking the right medicines and  the right dose. NOTHING BY MOUTH: Do not eat or drink anything for now. (Reason: condition may need surgery and general anesthesia.) CARE ADVICE given per Abdominal Pain, Female (Adult) guideline. Referrals [facility] - ED

## 2019-12-07 NOTE — Telephone Encounter (Signed)
Noted  

## 2019-12-28 ENCOUNTER — Encounter: Payer: Self-pay | Admitting: Primary Care

## 2020-02-07 ENCOUNTER — Encounter (HOSPITAL_COMMUNITY): Payer: Self-pay

## 2020-02-07 ENCOUNTER — Emergency Department (HOSPITAL_COMMUNITY): Payer: No Typology Code available for payment source

## 2020-02-07 ENCOUNTER — Emergency Department (HOSPITAL_COMMUNITY)
Admission: EM | Admit: 2020-02-07 | Discharge: 2020-02-07 | Disposition: A | Discharge status: Home / Self Care | Payer: No Typology Code available for payment source | Attending: Emergency Medicine | Admitting: Emergency Medicine

## 2020-02-07 ENCOUNTER — Other Ambulatory Visit: Payer: Self-pay

## 2020-02-07 DIAGNOSIS — Z79899 Other long term (current) drug therapy: Secondary | ICD-10-CM | POA: Insufficient documentation

## 2020-02-07 DIAGNOSIS — R112 Nausea with vomiting, unspecified: Secondary | ICD-10-CM | POA: Insufficient documentation

## 2020-02-07 DIAGNOSIS — R748 Abnormal levels of other serum enzymes: Secondary | ICD-10-CM | POA: Diagnosis not present

## 2020-02-07 DIAGNOSIS — R1013 Epigastric pain: Secondary | ICD-10-CM | POA: Insufficient documentation

## 2020-02-07 DIAGNOSIS — R197 Diarrhea, unspecified: Secondary | ICD-10-CM | POA: Diagnosis not present

## 2020-02-07 LAB — COMPREHENSIVE METABOLIC PANEL
ALT: 33 U/L (ref 0–44)
AST: 36 U/L (ref 15–41)
Albumin: 4.7 g/dL (ref 3.5–5.0)
Alkaline Phosphatase: 42 U/L (ref 38–126)
Anion gap: 11 (ref 5–15)
BUN: 13 mg/dL (ref 6–20)
CO2: 26 mmol/L (ref 22–32)
Calcium: 9.5 mg/dL (ref 8.9–10.3)
Chloride: 100 mmol/L (ref 98–111)
Creatinine, Ser: 0.77 mg/dL (ref 0.44–1.00)
GFR calc Af Amer: >60 mL/min (ref >60)
GFR calc non Af Amer: >60 mL/min (ref >60)
Glucose, Bld: 120 mg/dL — ABNORMAL HIGH (ref 70–99)
Potassium: 3.4 mmol/L — ABNORMAL LOW (ref 3.5–5.1)
Sodium: 137 mmol/L (ref 135–145)
Total Bilirubin: 0.6 mg/dL (ref 0.3–1.2)
Total Protein: 8.7 g/dL — ABNORMAL HIGH (ref 6.5–8.1)

## 2020-02-07 LAB — URINALYSIS, ROUTINE W REFLEX MICROSCOPIC
Bacteria, UA: NONE SEEN
Bilirubin Urine: NEGATIVE
Glucose, UA: NEGATIVE mg/dL
Ketones, ur: 5 mg/dL — AB
Leukocytes,Ua: NEGATIVE
Nitrite: NEGATIVE
Protein, ur: NEGATIVE mg/dL
Specific Gravity, Urine: >1.046 — ABNORMAL HIGH (ref 1.005–1.030)
pH: 7 (ref 5.0–8.0)

## 2020-02-07 LAB — CBC
HCT: 45.8 % (ref 36.0–46.0)
Hemoglobin: 15.1 g/dL — ABNORMAL HIGH (ref 12.0–15.0)
MCH: 29.5 pg (ref 26.0–34.0)
MCHC: 33 g/dL (ref 30.0–36.0)
MCV: 89.6 fL (ref 80.0–100.0)
Platelets: 232 10*3/uL (ref 150–400)
RBC: 5.11 MIL/uL (ref 3.87–5.11)
RDW: 13.4 % (ref 11.5–15.5)
WBC: 5.2 10*3/uL (ref 4.0–10.5)
nRBC: 0 % (ref 0.0–0.2)

## 2020-02-07 LAB — I-STAT BETA HCG BLOOD, ED (MC, WL, AP ONLY): I-stat hCG, quantitative: <5 m[IU]/mL (ref <5)

## 2020-02-07 LAB — LIPASE, BLOOD: Lipase: 218 U/L — ABNORMAL HIGH (ref 11–51)

## 2020-02-07 MED ORDER — PROMETHAZINE HCL 25 MG RE SUPP: 25.0000 mg | Freq: Four times a day (QID) | RECTAL | 0 refills | Status: DC | PRN

## 2020-02-07 MED ORDER — SUCRALFATE 1 GM/10ML PO SUSP: 1.0000 g | Freq: Three times a day (TID) | ORAL | 0 refills | Status: DC

## 2020-02-07 MED ORDER — SODIUM CHLORIDE (PF) 0.9 % IJ SOLN
INTRAMUSCULAR | Status: AC
  Filled 2020-02-07: qty 50

## 2020-02-07 MED ORDER — HYDROCODONE-ACETAMINOPHEN 5-325 MG PO TABS: 2.0000 | ORAL_TABLET | ORAL | 0 refills | Status: DC | PRN

## 2020-02-07 MED ORDER — SODIUM CHLORIDE 0.9 % IV BOLUS
1000.0000 mL | Freq: Once | INTRAVENOUS | Status: AC
  Administered 2020-02-07: 1000 mL via INTRAVENOUS

## 2020-02-07 MED ORDER — FAMOTIDINE 20 MG PO TABS: 20.0000 mg | ORAL_TABLET | Freq: Two times a day (BID) | ORAL | 0 refills | Status: DC

## 2020-02-07 MED ORDER — MORPHINE SULFATE (PF) 4 MG/ML IV SOLN
4.0000 mg | Freq: Once | INTRAVENOUS | Status: AC
  Administered 2020-02-07: 4 mg via INTRAVENOUS
  Filled 2020-02-07: qty 1

## 2020-02-07 MED ORDER — IOHEXOL 300 MG/ML  SOLN
100.0000 mL | Freq: Once | INTRAMUSCULAR | Status: AC | PRN
  Administered 2020-02-07: 80 mL via INTRAVENOUS

## 2020-02-07 MED ORDER — ONDANSETRON HCL 4 MG/2ML IJ SOLN
4.0000 mg | Freq: Once | INTRAMUSCULAR | Status: AC
  Administered 2020-02-07: 15:00:00 4 mg via INTRAVENOUS
  Filled 2020-02-07: qty 2

## 2020-02-07 NOTE — Discharge Instructions (Addendum)
Clear liquid diet as tolerated. Follow up with GI outpatient.

## 2020-02-07 NOTE — ED Provider Notes (Signed)
[facility]-EMERGENCY DEPT Provider Note   CSN: [id number] Arrival date & time: 02/07/20  1403     History Chief Complaint  Patient presents with  . Abdominal Pain  . Emesis  . Diarrhea    Stephanie [name] is a 24 y.o. female with past medical history significant for pancreatitis, IBS, anemia who presents for evaluation of abd pain.  Nominal pain x2 days.  Located to her diffuse upper abdomen.  Has had multiple episodes of NBNB emesis.  Patient also with diarrhea melena bright red blood per rectum.  Describes her pain as cramping and aching.  Does not radiate into her back.  Does not drink alcohol, NSAID use.  No marijuana use.  Took Zofran at home without relief of her emesis.  Denies fever, chills, chest pain, shortness of breath, flank pain, pelvic pain, vaginal discharge concerns for STDs.  Has noticed dark urine however no dysuria.  Did start her menstrual cycle today.  Rates her pain an 8/10.  Denies additional aggravating or alleviating factors.  History obtained from patient and past medical record.  No interpreter was used.  HPI     Past Medical History:  Diagnosis Date  . Acute pancreatitis without infection or necrosis 04/21/2019  . Anemia   . Anxiety 01/28/2013   no meds  . Depression    no meds  . IBS (irritable bowel syndrome)   . Migraines    HA every other day - otc med prn  . Recurrent streptococcal tonsillitis   . Seasonal allergies    Seasonal environmental allergies  . Sebaceous cyst 08/04/2018   L mid back   . Vision abnormalities    Myopia; patient has history of devloping ulcers when she wore contacts.    Patient Active Problem List   Diagnosis Date Noted  . Flank pain 07/10/2019  . Near syncope 06/05/2019  . Nausea and vomiting 04/20/2019  . Axillary hidradenitis suppurativa 03/05/2019  . Recurrent infection of skin 02/04/2019  . Preventative health care 11/20/2017  . Menorrhagia with regular cycle 09/24/2014  .  Migraines 08/04/2014  . Allergic rhinitis 04/03/2013  . MDD (major depressive disorder), single episode, severe (HCC) 01/28/2013    Class: Chronic  . Generalized anxiety disorder 01/28/2013    Past Surgical History:  Procedure Laterality Date  . COLONOSCOPY     IBS  . COLONOSCOPY     IBS  . LAPAROSCOPY N/A 10/20/2018   Procedure: LAPAROSCOPY DIAGNOSTIC possible removal of endometriosis;  Surgeon: Mitchel Honour, DO;  Location: [facility];  Service: Gynecology;  Laterality: N/A;     OB History    Gravida  0   Para  0   Term  0   Preterm  0   AB  0   Living  0     SAB  0   TAB  0   Ectopic  0   Multiple  0   Live Births  0        Obstetric Comments  Menstrual age: 53  Age 1st Pregnancy: N/A         Family History  Problem Relation Age of Onset  . Depression Mother   . Migraines Mother   . Migraines Father     Social History   Tobacco Use  . Smoking status: Never Smoker  . Smokeless tobacco: Never Used  Substance Use Topics  . Alcohol use: Never    Comment: socially  . Drug use: Yes    Types: Marijuana  Comment: Last use Thursday 10/16/18    Home Medications Prior to Admission medications   Medication Sig Start Date End Date Taking? Authorizing Provider  etonogestrel (NEXPLANON) 68 MG IMPL implant 1 each by Subdermal route once. 09/30/18  Yes [provider]  ondansetron (ZOFRAN ODT) 8 MG disintegrating tablet Take 1 tablet (8 mg total) by mouth every 8 (eight) hours as needed for nausea or vomiting. 10/26/19  Yes Joy, Shawn C, PA-C  dicyclomine (BENTYL) 20 MG tablet Take 1 tablet (20 mg total) by mouth 2 (two) times daily. Patient not taking: Reported on 12/05/2019 10/26/19   Joy, Hillard Danker, PA-C  famotidine (PEPCID) 20 MG tablet Take 1 tablet (20 mg total) by mouth 2 (two) times daily. 02/07/20   Davonne Baby A, PA-C  HYDROcodone-acetaminophen (NORCO/VICODIN) 5-325 MG tablet Take 2 tablets by mouth every 4 (four) hours as needed.  02/07/20   Zakhai Meisinger A, PA-C  promethazine (PHENERGAN) 25 MG suppository Place 1 suppository (25 mg total) rectally every 6 (six) hours as needed for nausea or vomiting. 02/07/20   Raunel Dimartino A, PA-C  sucralfate (CARAFATE) 1 GM/10ML suspension Take 10 mLs (1 g total) by mouth 4 (four) times daily -  with meals and at bedtime. 02/07/20   Jelani Trueba A, PA-C  metoCLOPramide (REGLAN) 10 MG tablet Take 1 tablet (10 mg total) by mouth every 6 (six) hours as needed for nausea or vomiting. Patient not taking: Reported on 09/19/2019 06/19/19 09/19/19  Caccavale, Sophia, PA-C    Allergies    Coconut flavor and Septra [sulfamethoxazole-trimethoprim]  Review of Systems   Review of Systems  Constitutional: Negative.   HENT: Negative.   Respiratory: Negative.   Cardiovascular: Negative.   Gastrointestinal: Positive for abdominal pain, diarrhea, nausea and vomiting. Negative for abdominal distention, anal bleeding, blood in stool, constipation and rectal pain.  Genitourinary: Negative.   Musculoskeletal: Negative.   Skin: Negative.   Neurological: Negative.   All other systems reviewed and are negative.   Physical Exam Updated Vital Signs BP 110/77   Pulse 89   Temp 98.7 F (37.1 C) (Oral)   Resp 18   LMP 02/07/2020 (Approximate)   SpO2 99%   Physical Exam Vitals and nursing note reviewed.  Constitutional:      General: She is not in acute distress.    Appearance: She is well-developed. She is not ill-appearing, toxic-appearing or diaphoretic.  HENT:     Head: Normocephalic and atraumatic.     Mouth/Throat:     Mouth: Mucous membranes are moist.  Eyes:     Pupils: Pupils are equal, round, and reactive to light.  Cardiovascular:     Rate and Rhythm: Normal rate.     Heart sounds: Normal heart sounds.  Pulmonary:     Effort: Pulmonary effort is normal. No respiratory distress.     Breath sounds: Normal breath sounds.  Abdominal:     General: There is no distension.       Palpations: Abdomen is soft.     Tenderness: There is generalized abdominal tenderness and tenderness in the right upper quadrant, epigastric area and left upper quadrant. There is no right CVA tenderness, left CVA tenderness, guarding or rebound. Negative signs include Murphy's sign and McBurney's sign.     Hernia: No hernia is present.  Musculoskeletal:        General: Normal range of motion.     Cervical back: Normal range of motion.  Skin:    General: Skin is warm and  dry.     Capillary Refill: Capillary refill takes 2 to 3 seconds.  Neurological:     Mental Status: She is alert.     ED Results / Procedures / Treatments   Labs (all labs ordered are listed, but only abnormal results are displayed) Labs Reviewed  LIPASE, BLOOD - Abnormal; Notable for the following components:      Result Value   Lipase 218 (*)    All other components within normal limits  COMPREHENSIVE METABOLIC PANEL - Abnormal; Notable for the following components:   Potassium 3.4 (*)    Glucose, Bld 120 (*)    Total Protein 8.7 (*)    All other components within normal limits  CBC - Abnormal; Notable for the following components:   Hemoglobin 15.1 (*)    All other components within normal limits  URINALYSIS, ROUTINE W REFLEX MICROSCOPIC - Abnormal; Notable for the following components:   Specific Gravity, Urine >1.046 (*)    Hgb urine dipstick SMALL (*)    Ketones, ur 5 (*)    All other components within normal limits  I-STAT BETA HCG BLOOD, ED ([facility], [facility], [facility] ONLY)    EKG None  Radiology CT ABDOMEN PELVIS W CONTRAST  Result Date: 02/07/2020 CLINICAL DATA:  Epigastric pain EXAM: CT ABDOMEN AND PELVIS WITH CONTRAST TECHNIQUE: Multidetector CT imaging of the abdomen and pelvis was performed using the standard protocol following bolus administration of intravenous contrast. CONTRAST:  4mL OMNIPAQUE IOHEXOL 300 MG/ML  SOLN COMPARISON:  09/20/2019 FINDINGS: Lower chest: No acute abnormality.  Hepatobiliary: No new focal liver lesion. Gallbladder is unremarkable. No biliary dilatation. Pancreas: Unremarkable. Spleen: Unremarkable. Adrenals/Urinary Tract: Adrenal glands are unremarkable. Kidneys are normal, without renal calculi, focal lesion, or hydronephrosis. Bladder is unremarkable. Stomach/Bowel: Stomach is within normal limits. Bowel is normal in caliber. The appendix is within normal limits Vascular/Lymphatic: No significant vascular findings are present. No enlarged abdominal or pelvic lymph nodes. Reproductive: Uterus and bilateral adnexa are unremarkable. Other: No abdominal wall hernia or abnormality. No abdominopelvic ascites. Musculoskeletal: No acute or significant osseous findings. IMPRESSION: No findings to account for reported symptoms. Electronically Signed   By: Guadlupe Spanish M.D.   On: 02/07/2020 16:36    Procedures Procedures (including critical care time)  Medications Ordered in ED Medications  sodium chloride (PF) 0.9 % injection (has no administration in time range)  sodium chloride 0.9 % bolus 1,000 mL (0 mLs Intravenous Stopped 02/07/20 1544)  ondansetron (ZOFRAN) injection 4 mg (4 mg Intravenous Given 02/07/20 1456)  morphine 4 MG/ML injection 4 mg (4 mg Intravenous Given 02/07/20 1456)  iohexol (OMNIPAQUE) 300 MG/ML solution 100 mL (80 mLs Intravenous Contrast Given 02/07/20 1612)  morphine 4 MG/ML injection 4 mg (4 mg Intravenous Given 02/07/20 1807)    ED Course  I have reviewed the triage vital signs and the nursing notes.  Pertinent labs & imaging results that were available during my care of the patient were reviewed by me and considered in my medical decision making (see chart for details).  24 year old presents for abdominal pain. Afebrile, non septic, non ill appearing.  Abdomen some generalized tenderness to upper region primarily epigastric however negative Murphy sign.  No rebound or guarding.  Looks clinically well-hydrated.  No concerns for STDs,  pelvic pain, vaginal discharge.  Does her menstrual cycle today.  Plan on labs, fluids, antiemetics and reevaluate.  Labs and imaging personally reviewed and interpreted: CBC without leukocytosis CMP mild hyperglycemia at 120, hypokalemia at 3.4  Preg negative UA negative for infection, blood however on menstrual cycle Lipase elevated at 218 CT [facility] without acute findings specifically no pancreatic inflammation, gallbladder pathology  Patient reassessed.  Tolerating p.o. intake without difficulty.  Pain controlled.  Discussed findings of elevated lipase.  She has had this with her prior ED visits however also discussed CT without any pancreatic inflammation.  She has negative Murphy sign.  I have low suspicion for obstructing stone as cause of elevated lipase.  Discussed clear liquid diet.  She denies any alcohol use, NSAIDs, Tylenol.  Discussed follow-up with GI given this has been a recurrent issue with 3 episodes over the last 6 months.  She is agreeable to plan.  Discussed return precautions.  Patient is nontoxic, nonseptic appearing, in no apparent distress.  Patient's pain and other symptoms adequately managed in emergency department.  Fluid bolus given.  Labs, imaging and vitals reviewed.  Patient does not meet the SIRS or Sepsis criteria.  On repeat exam patient does not have a surgical abdomin and there are no peritoneal signs.  No indication of appendicitis, bowel obstruction, bowel perforation, cholecystitis, diverticulitis, PID or ectopic pregnancy.  Patient discharged home with symptomatic treatment and given strict instructions for follow-up with their primary care physician.  I have also discussed reasons to return immediately to the ER.  Patient expresses understanding and agrees with plan.  The patient has been appropriately medically screened and/or stabilized in the ED. I have low suspicion for any other emergent medical condition which would require further screening, evaluation or  treatment in the ED or require inpatient management.    MDM Rules/Calculators/A&P                       Final Clinical Impression(s) / ED Diagnoses Final diagnoses:  Nausea vomiting and diarrhea  Epigastric pain  Elevated lipase    Rx / DC Orders ED Discharge Orders         Ordered    sucralfate (CARAFATE) 1 GM/10ML suspension  3 times daily with meals & bedtime     02/07/20 1856    famotidine (PEPCID) 20 MG tablet  2 times daily     02/07/20 1856    promethazine (PHENERGAN) 25 MG suppository  Every 6 hours PRN     02/07/20 1856    HYDROcodone-acetaminophen (NORCO/VICODIN) 5-325 MG tablet  Every 4 hours PRN     02/07/20 1902           Livana Yerian A, PA-C 02/07/20 Miki Kins, MD 02/08/20 2324

## 2020-02-07 NOTE — ED Triage Notes (Signed)
Pt presents with c/o abdominal pain, vomiting, and diarrhea since yesterday.

## 2020-02-07 NOTE — ED Notes (Signed)
An After Visit Summary was printed and given to the patient. Discharge instructions given and no further questions at this time.  

## 2020-02-08 MED FILL — SUCRALFATE 1 GM/10ML SUSP: 1 | 10 days supply | Qty: 420 | Fill #0

## 2020-02-08 MED FILL — PROMETHAZINE HCL 25 MG SUPP: 25 | 3 days supply | Qty: 12 | Fill #0

## 2020-02-08 MED FILL — HYDROCODON-APAP 5-325: 5-325 | 2 days supply | Qty: 10 | Fill #0

## 2020-02-08 MED FILL — FAMOTIDINE 20 MG TABS: 20 | 15 days supply | Qty: 30 | Fill #0

## 2020-02-17 ENCOUNTER — Ambulatory Visit: Payer: No Typology Code available for payment source | Admitting: Physician Assistant

## 2020-02-17 ENCOUNTER — Encounter: Payer: Self-pay | Admitting: Physician Assistant

## 2020-02-17 VITALS — BP 100/72 | HR 92 | Temp 98.4°F | Ht 62.75 in | Wt 122.2 lb

## 2020-02-17 DIAGNOSIS — R194 Change in bowel habit: Secondary | ICD-10-CM | POA: Diagnosis not present

## 2020-02-17 DIAGNOSIS — R1013 Epigastric pain: Secondary | ICD-10-CM | POA: Diagnosis not present

## 2020-02-17 DIAGNOSIS — R112 Nausea with vomiting, unspecified: Secondary | ICD-10-CM

## 2020-02-17 DIAGNOSIS — R748 Abnormal levels of other serum enzymes: Secondary | ICD-10-CM | POA: Diagnosis not present

## 2020-02-17 MED ORDER — PANTOPRAZOLE SODIUM 40 MG PO TBEC: 40.0000 mg | DELAYED_RELEASE_TABLET | Freq: Every day | ORAL | 3 refills | Status: DC

## 2020-02-17 NOTE — Progress Notes (Addendum)
Chief Complaint: Nausea, vomiting, diarrhea  HPI:    Stephanie [name] is a 24 year old African-American female with a past medical history of pancreatitis, IBS and anemia, it appears she previously followed with Dr. Bosie Clos for her diarrhea, who was referred to me by Henderly, Britni A, PA-C for a complaint of nausea, vomiting and diarrhea.      Per chart review it appears patient has been to the ED for the symptoms 7 times over the past 9 months, most recently 02/07/2020.  On that day patient presented with abdominal pain for 2 days in her upper abdomen as well as multiple episodes of vomiting and some diarrhea with blood per the patient.  Described pain as cramping and aching.  Lipase noted to 18, CMP with a potassium of 3.4 and otherwise normal.  Hemoglobin elevated at 15.1.  CT abdomen pelvis with no findings to account for reported symptoms.  Pancreas was unremarkable.  Patient improved in ED and was given Carafate suspension 3 times daily, Pepcid 20 mg twice a day, Phenergan 25 mg suppository every 6 hours and hydrocodone.  She was told to follow-up with [place].    Today, the patient presents to clinic and explains that back in 2018 she saw a gastroenterologist because she kept having episodes of diarrhea.  At that time she had a colonoscopy and was told that she had IBS.  Since then she has continued with alternating bowel habits and tells me occasionally she will have diarrhea and other times will go 3 days in between stools and sometimes when she is going to the bathroom she gets sweaty and hot and feels like she is going to faint.  Over the past couple of days tells me that her stools have been black in color.  This happens occasionally.    Most concerning to the patient is that she has been to the ER multiple times for nausea/vomiting and epigastric pain.  Most recently as above.  Tells me that she has had stomach issues forever and always thought that she was lactose intolerant.  She then started  getting severely ill with vomiting for 3 days straight when her periods would start and then started getting the symptoms in between her periods.  Saw an OB/GYN who started her on oral contraceptive pills in addition to her Nexplanon but this did not change her symptoms.  Last year in June after using a lot of ibuprofen at the beginning of one of her periods, she went to the ER for epigastric pain and vomiting and was told she had pancreatitis.  Tells she anytime she would eat she would vomit.      Now the patient tells me that she remains nauseous pretty much every day and does pressure points and peppermint oil to help with this and the occasional Zofran.  She does not take any chronic medications.  Often when she eats she will vomit her food shortly after or just does not feel like eating at all.  Along with this experiences an epigastric pain and tightness which seems to come and go and is definitely worse after eating.  This is rated as a 6-7/10.    Denies fever or chills.  Past Medical History:  Diagnosis Date  . Acute pancreatitis without infection or necrosis 04/21/2019  . Anemia   . Anxiety 01/28/2013   no meds  . Depression    no meds  . IBS (irritable bowel syndrome)   . Migraines    HA every other  day - otc med prn  . Recurrent streptococcal tonsillitis   . Seasonal allergies    Seasonal environmental allergies  . Sebaceous cyst 08/04/2018   L mid back   . Vision abnormalities    Myopia; patient has history of devloping ulcers when she wore contacts.    Past Surgical History:  Procedure Laterality Date  . COLONOSCOPY     IBS  . COLONOSCOPY     IBS  . LAPAROSCOPY N/A 10/20/2018   Procedure: LAPAROSCOPY DIAGNOSTIC possible removal of endometriosis;  Surgeon: Mitchel Honour, DO;  Location: [facility];  Service: Gynecology;  Laterality: N/A;    Current Outpatient Medications  Medication Sig Dispense Refill  . dicyclomine (BENTYL) 20 MG tablet Take 1 tablet (20 mg total) by mouth 2  (two) times daily. (Patient not taking: Reported on 12/05/2019) 20 tablet 0  . etonogestrel (NEXPLANON) 68 MG IMPL implant 1 each by Subdermal route once.    . famotidine (PEPCID) 20 MG tablet Take 1 tablet (20 mg total) by mouth 2 (two) times daily. 30 tablet 0  . HYDROcodone-acetaminophen (NORCO/VICODIN) 5-325 MG tablet Take 2 tablets by mouth every 4 (four) hours as needed. 10 tablet 0  . ondansetron (ZOFRAN ODT) 8 MG disintegrating tablet Take 1 tablet (8 mg total) by mouth every 8 (eight) hours as needed for nausea or vomiting. 20 tablet 0  . promethazine (PHENERGAN) 25 MG suppository Place 1 suppository (25 mg total) rectally every 6 (six) hours as needed for nausea or vomiting. 12 each 0  . sucralfate (CARAFATE) 1 GM/10ML suspension Take 10 mLs (1 g total) by mouth 4 (four) times daily -  with meals and at bedtime. 420 mL 0   No current facility-administered medications for this visit.    Allergies as of 02/17/2020 - Review Complete 02/07/2020  Allergen Reaction Noted  . Coconut flavor Other (See Comments) 06/02/2019  . Septra [sulfamethoxazole-trimethoprim] Nausea Only and Other (See Comments) 02/21/2019    Family History  Problem Relation Age of Onset  . Depression Mother   . Migraines Mother   . Migraines Father     Social History   Socioeconomic History  . Marital status: Single    Spouse name: Not on file  . Number of children: Not on file  . Years of education: Not on file  . Highest education level: Not on file  Occupational History  . Not on file  Tobacco Use  . Smoking status: Never Smoker  . Smokeless tobacco: Never Used  Substance and Sexual Activity  . Alcohol use: Never    Comment: socially  . Drug use: Yes    Types: Marijuana    Comment: Last use Thursday 10/16/18  . Sexual activity: Not Currently    Partners: Male    Birth control/protection: Implant    Comment: broke up with first sexual partner 1 month ago  Other Topics Concern  . Not on file    Social History Narrative   Single.    Student at [employer].   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    Enjoys listening to music, hanging out with friends.    Social Determinants of Health   Financial Resource Strain:   . Difficulty of Paying Living Expenses:   Food Insecurity:   . Worried About Programme researcher, broadcasting/film/video in the Last Year:   . Barista in the Last Year:   Transportation Needs:   . Freight forwarder (Medical):   Marland Kitchen Lack of Transportation (  Non-Medical):   Physical Activity:   . Days of Exercise per Week:   . Minutes of Exercise per Session:   Stress:   . Feeling of Stress :   Social Connections:   . Frequency of Communication with Friends and Family:   . Frequency of Social Gatherings with Friends and Family:   . Attends Religious Services:   . Active Member of Clubs or Organizations:   . Attends Banker Meetings:   Marland Kitchen Marital Status:   Intimate Partner Violence:   . Fear of Current or Ex-Partner:   . Emotionally Abused:   Marland Kitchen Physically Abused:   . Sexually Abused:     Review of Systems:    Constitutional: No weight loss, fever or chills Skin: No rash  Cardiovascular: No chest pain Respiratory: No SOB Gastrointestinal: See HPI and otherwise negative Genitourinary: No dysuria  Neurological: No headache, dizziness or syncope Musculoskeletal: No new muscle or joint pain Hematologic: No bleeding Psychiatric: No history of depression or anxiety   Physical Exam:  Vital signs: BP 100/72 (BP Location: Left Arm, Patient Position: Sitting, Cuff Size: Normal)   Pulse 92   Temp 98.4 F (36.9 C)   Ht 5' 2.75" (1.594 m) Comment: height measured without shoes  Wt 122 lb 4 oz (55.5 kg)   LMP 02/07/2020 (Approximate)   BMI 21.83 kg/m   Constitutional:   Pleasant AA female appears to be in NAD, Well developed, Well nourished, alert and cooperative Head:  Normocephalic and atraumatic. Eyes:   PEERL, EOMI. No icterus. Conjunctiva  pink. Ears:  Normal auditory acuity. Neck:  Supple Throat: Oral cavity and pharynx without inflammation, swelling or lesion.  Respiratory: Respirations even and unlabored. Lungs clear to auscultation bilaterally.   No wheezes, crackles, or rhonchi.  Cardiovascular: Normal S1, S2. No MRG. Regular rate and rhythm. No peripheral edema, cyanosis or pallor.  Gastrointestinal:  Soft, nondistended, mild epigastric ttp. No rebound or guarding. Normal bowel sounds. No appreciable masses or hepatomegaly. Rectal:  Not performed.  Msk:  Symmetrical without gross deformities. Without edema, no deformity or joint abnormality.  Neurologic:  Alert and  oriented x4;  grossly normal neurologically.  Skin: Damp and intact without significant lesions or rashes. Psychiatric:  Demonstrates good judgement and reason without abnormal affect or behaviors.  RELEVANT LABS AND IMAGING: CBC    Component Value Date/Time   WBC 5.2 02/07/2020 1426   RBC 5.11 02/07/2020 1426   HGB 15.1 (H) 02/07/2020 1426   HCT 45.8 02/07/2020 1426   PLT 232 02/07/2020 1426   MCV 89.6 02/07/2020 1426   MCH 29.5 02/07/2020 1426   MCHC 33.0 02/07/2020 1426   RDW 13.4 02/07/2020 1426   LYMPHSABS 1.1 12/06/2019 1137   MONOABS 0.2 12/06/2019 1137   EOSABS 0.0 12/06/2019 1137   BASOSABS 0.0 12/06/2019 1137    CMP     Component Value Date/Time   NA 137 02/07/2020 1426   K 3.4 (L) 02/07/2020 1426   CL 100 02/07/2020 1426   CO2 26 02/07/2020 1426   GLUCOSE 120 (H) 02/07/2020 1426   BUN 13 02/07/2020 1426   CREATININE 0.77 02/07/2020 1426   CALCIUM 9.5 02/07/2020 1426   PROT 8.7 (H) 02/07/2020 1426   ALBUMIN 4.7 02/07/2020 1426   AST 36 02/07/2020 1426   ALT 33 02/07/2020 1426   ALKPHOS 42 02/07/2020 1426   BILITOT 0.6 02/07/2020 1426   GFRNONAA >60 02/07/2020 1426   GFRAA >60 02/07/2020 1426    Assessment: 1.  Epigastric pain: Comes in waves throughout the day associated with nausea, lipase has been elevated in the past  but no radiographic evidence of pancreatitis; consider gastric origin 2.  Nausea and vomiting: With above, though nausea is constant now, sometimes worse around periods; consider endometriosis versus gastric origin versus other 3.  Alternating bowel habits: Likely IBS is diagnosed back in 2018, requesting records  Plan: 1.  After extensive review of chart I cannot find a CT of the abdomen and pelvis which shows pancreatitis.  She does have history of elevated lipase going back to 04/20/2019.  Need to consider gastric origin instead. 2.  Scheduled patient for an EGD in the [place] with Dr. Bryan Lemma as he had availability.  Did discuss risks, benefits, limitations and alternatives and patient agrees to proceed.  Patient will be Covid tested 2 days prior to time of procedure.  Patient will continue to follow with Dr. Bryan Lemma after her procedure. 3.  Started the patient on Pantoprazole 40 mg daily, 30 to 60 minutes before eating breakfast.  Prescribed #30 with 3 refills. 4.  Continue to use Zofran as needed for nausea 5.  Requested records from Dr. Michail Sermon from EGD in 2018 6.  Patient to follow in clinic per recommendations after time of procedure.  Ellouise Newer, PA-C [facility] 02/17/2020, 9:41 AM  Cc: Henderly, Britni A, PA-C   Addendum: 02/24/2020 830  01/21/2017 colonoscopy with Dr. Michail Sermon was normal to the cecum.  There were some internal hemorrhoids.  Pathology revealed small bowel mucosa with no significant pathologic changes and colonic mucosa with no significant pathologic changes.  No change to plans  Ellouise Newer, PA-C

## 2020-02-17 NOTE — Progress Notes (Signed)
Agree with the assessment and plan as outlined by Jennifer Lemmon, PA-C. ? ?Chiniqua Kilcrease, DO, FACG ? ?

## 2020-02-17 NOTE — Patient Instructions (Addendum)
If you are age 23 or older, your body mass index should be between 23-30. Your Body mass index is 21.83 kg/m. If this is out of the aforementioned range listed, please consider follow up with your Primary Care Provider.  If you are age 49 or younger, your body mass index should be between 19-25. Your Body mass index is 21.83 kg/m. If this is out of the aformentioned range listed, please consider follow up with your Primary Care Provider.   We have sent the following medications to your pharmacy for you to pick up at your convenience: Pantoprazole 40 mg daily 30-60 minutes before breakfast.  You have been scheduled for an endoscopy. Please follow written instructions given to you at your visit today. If you use inhalers (even only as needed), please bring them with you on the day of your procedure.

## 2020-02-17 NOTE — Addendum Note (Signed)
Addended by: Mariane Duval on: 02/17/2020 12:08 PM   Modules accepted: Orders

## 2020-02-17 NOTE — Addendum Note (Signed)
Addended by: Mariane Duval on: 02/17/2020 10:32 AM   Modules accepted: Orders

## 2020-02-23 ENCOUNTER — Encounter: Payer: No Typology Code available for payment source | Admitting: Gastroenterology

## 2020-03-02 ENCOUNTER — Ambulatory Visit (INDEPENDENT_AMBULATORY_CARE_PROVIDER_SITE_OTHER): Payer: No Typology Code available for payment source

## 2020-03-02 ENCOUNTER — Other Ambulatory Visit: Payer: Self-pay | Admitting: Gastroenterology

## 2020-03-02 DIAGNOSIS — Z1159 Encounter for screening for other viral diseases: Secondary | ICD-10-CM

## 2020-03-03 LAB — SARS CORONAVIRUS 2 (TAT 6-24 HRS): SARS Coronavirus 2: NEGATIVE

## 2020-03-04 ENCOUNTER — Encounter: Payer: Self-pay | Admitting: Gastroenterology

## 2020-03-04 ENCOUNTER — Other Ambulatory Visit: Payer: Self-pay

## 2020-03-04 ENCOUNTER — Ambulatory Visit (AMBULATORY_SURGERY_CENTER): Payer: No Typology Code available for payment source | Admitting: Gastroenterology

## 2020-03-04 VITALS — BP 118/73 | HR 96 | Temp 98.2°F | Resp 18 | Ht 62.75 in | Wt 122.0 lb

## 2020-03-04 DIAGNOSIS — K251 Acute gastric ulcer with perforation: Secondary | ICD-10-CM | POA: Diagnosis not present

## 2020-03-04 DIAGNOSIS — K2970 Gastritis, unspecified, without bleeding: Secondary | ICD-10-CM | POA: Diagnosis not present

## 2020-03-04 DIAGNOSIS — B9681 Helicobacter pylori [H. pylori] as the cause of diseases classified elsewhere: Secondary | ICD-10-CM

## 2020-03-04 DIAGNOSIS — K259 Gastric ulcer, unspecified as acute or chronic, without hemorrhage or perforation: Secondary | ICD-10-CM

## 2020-03-04 DIAGNOSIS — K2950 Unspecified chronic gastritis without bleeding: Secondary | ICD-10-CM | POA: Diagnosis not present

## 2020-03-04 DIAGNOSIS — R1013 Epigastric pain: Secondary | ICD-10-CM

## 2020-03-04 DIAGNOSIS — K2980 Duodenitis without bleeding: Secondary | ICD-10-CM | POA: Diagnosis not present

## 2020-03-04 DIAGNOSIS — K269 Duodenal ulcer, unspecified as acute or chronic, without hemorrhage or perforation: Secondary | ICD-10-CM | POA: Diagnosis not present

## 2020-03-04 DIAGNOSIS — R112 Nausea with vomiting, unspecified: Secondary | ICD-10-CM

## 2020-03-04 HISTORY — PX: UPPER GASTROINTESTINAL ENDOSCOPY: SHX188

## 2020-03-04 MED ORDER — PANTOPRAZOLE SODIUM 40 MG PO TBEC: DELAYED_RELEASE_TABLET | ORAL | 1 refills | Status: DC

## 2020-03-04 MED ORDER — SODIUM CHLORIDE 0.9 % IV SOLN: 500.0000 mL | Freq: Once | INTRAVENOUS | Status: DC

## 2020-03-04 MED FILL — PANTOPRAZOLE SOD DR 40 MG T: 40 | 60 days supply | Qty: 120 | Fill #0

## 2020-03-04 NOTE — Progress Notes (Signed)
Pt's states no medical or surgical changes since previsit or office visit.  Temp JB Vitals SH

## 2020-03-04 NOTE — Patient Instructions (Addendum)
YOU HAD AN ENDOSCOPIC PROCEDURE TODAY AT THE [facility]:   Refer to the procedure report that was given to you for any specific questions about what was found during the examination.  If the procedure report does not answer your questions, please call your gastroenterologist to clarify.  If you requested that your care partner not be given the details of your procedure findings, then the procedure report has been included in a sealed envelope for you to review at your convenience later.  YOU SHOULD EXPECT: Some feelings of bloating in the abdomen. Passage of more gas than usual.  Walking can help get rid of the air that was put into your GI tract during the procedure and reduce the bloating. If you had a lower endoscopy (such as a colonoscopy or flexible sigmoidoscopy) you may notice spotting of blood in your stool or on the toilet paper. If you underwent a bowel prep for your procedure, you may not have a normal bowel movement for a few days.  Please Note:  You might notice some irritation and congestion in your nose or some drainage.  This is from the oxygen used during your procedure.  There is no need for concern and it should clear up in a day or so.  SYMPTOMS TO REPORT IMMEDIATELY:    Following upper endoscopy (EGD)  Vomiting of blood or coffee ground material  New chest pain or pain under the shoulder blades  Painful or persistently difficult swallowing  New shortness of breath  Fever of 100F or higher  Black, tarry-looking stools  For urgent or emergent issues, a gastroenterologist can be reached at any hour by calling [phone]. Do not use MyChart messaging for urgent concerns.    DIET:  We do recommend a small meal at first, but then you may proceed to your regular diet.  Drink plenty of fluids but you should avoid alcoholic beverages for 24 hours.  ACTIVITY:  You should plan to take it easy for the rest of today and you should NOT DRIVE or use heavy machinery  until tomorrow (because of the sedation medicines used during the test).    FOLLOW UP: Our staff will call the number listed on your records 48-72 hours following your procedure to check on you and address any questions or concerns that you may have regarding the information given to you following your procedure. If we do not reach you, we will leave a message.  We will attempt to reach you two times.  During this call, we will ask if you have developed any symptoms of COVID 19. If you develop any symptoms (ie: fever, flu-like symptoms, shortness of breath, cough etc.) before then, please call [phone].  If you test positive for Covid 19 in the 2 weeks post procedure, please call and report this information to us.    If any biopsies were taken you will be contacted by phone or by letter within the next 1-3 weeks.  Please call us at [phone] if you have not heard about the biopsies in 3 weeks.    SIGNATURES/CONFIDENTIALITY: You and/or your care partner have signed paperwork which will be entered into your electronic medical record.  These signatures attest to the fact that that the information above on your After Visit Summary has been reviewed and is understood.  Full responsibility of the confidentiality of this discharge information lies with you and/or your care-partner.   Resume medications. Information given on gastritis. 

## 2020-03-04 NOTE — Op Note (Signed)
[facility] Patient Name: Stephanie [name] Procedure Date: 03/04/2020 4:30 PM MRN: [id number] Endoscopist: Doristine Locks , MD Age: 24 Referring MD:  Date of Birth: 1996-07-18 Gender: Female Account #: [id number] Procedure:                Upper GI endoscopy Indications:              Epigastric abdominal pain, Upper abdominal pain,                            Diarrhea/Loose stools/Change in bowel habits,                            Nausea with vomiting, Prior history of elevated                            lipase. Medicines:                Monitored Anesthesia Care Procedure:                Pre-Anesthesia Assessment:                           - Prior to the procedure, a History and Physical                            was performed, and patient medications and                            allergies were reviewed. The patient's tolerance of                            previous anesthesia was also reviewed. The risks                            and benefits of the procedure and the sedation                            options and risks were discussed with the patient.                            All questions were answered, and informed consent                            was obtained. Prior Anticoagulants: The patient has                            taken no previous anticoagulant or antiplatelet                            agents. ASA Grade Assessment: II - A patient with                            mild systemic disease. After reviewing the risks  and benefits, the patient was deemed in                            satisfactory condition to undergo the procedure.                           After obtaining informed consent, the endoscope was                            passed under direct vision. Throughout the                            procedure, the patient's blood pressure, pulse, and                            oxygen saturations were monitored continuously.  The                            Endoscope was introduced through the mouth, and                            advanced to the second part of duodenum. The upper                            GI endoscopy was accomplished without difficulty.                            The patient tolerated the procedure well. Scope In: Scope Out: Findings:                 The examined esophagus was normal.                           The Z-line was regular and was found 36 cm from the                            incisors.                           Two non-bleeding superficial gastric ulcers with no                            stigmata of bleeding were found in the gastric                            antrum. The largest lesion was 4 mm in largest                            dimension. Biopsies were taken with a cold forceps                            for Helicobacter pylori testing. Estimated blood  loss was minimal.                           Scattered mild inflammation characterized by                            erythema was found in the gastric body. Biopsies                            were taken with a cold forceps for Helicobacter                            pylori testing. Estimated blood loss was minimal.                           A single erosion without bleeding was found in the                            second portion of the duodenum. Biopsies were taken                            with a cold forceps for histology. Estimated blood                            loss was minimal.                           Normal mucosa was found in the duodenal bulb, in                            the first portion of the duodenum and in the second                            portion of the duodenum. Biopsies for histology                            were taken with a cold forceps for evaluation of                            celiac disease. Estimated blood loss was minimal. Complications:            No  immediate complications. Estimated Blood Loss:     Estimated blood loss was minimal. Impression:               - Normal esophagus.                           - Z-line regular, 36 cm from the incisors.                           - Non-bleeding gastric ulcers with no stigmata of                            bleeding. Biopsied.                           -  Gastritis. Biopsied.                           - Duodenal erosion without bleeding. Biopsied.                           - Normal mucosa was found in the duodenal bulb, in                            the first portion of the duodenum and in the second                            portion of the duodenum. Biopsied. Recommendation:           - Patient has a contact number available for                            emergencies. The signs and symptoms of potential                            delayed complications were discussed with the                            patient. Return to normal activities tomorrow.                            Written discharge instructions were provided to the                            patient.                           - Resume previous diet.                           - Continue present medications.                           - Await pathology results.                           - Use Protonix (pantoprazole) 40 mg PO BID for 8                            weeks to promote mucosal healing.                           - Repeat upper endoscopy in 6-8 weeks to check                            healing. Doristine Locks, MD 03/04/2020 5:13:43 PM

## 2020-03-04 NOTE — Progress Notes (Signed)
PT taken to PACU. Monitors in place. VSS. Report given to RN. 

## 2020-03-08 ENCOUNTER — Telehealth: Payer: Self-pay | Admitting: *Deleted

## 2020-03-08 NOTE — Telephone Encounter (Signed)
  Follow up Call-  Call back number 03/04/2020  Post procedure Call Back phone  # [phone]  Permission to leave phone message Yes  Some recent data might be hidden     Patient questions:  Do you have a fever, pain , or abdominal swelling? No. Pain Score  0 *  Have you tolerated food without any problems? Yes.    Have you been able to return to your normal activities? Yes.    Do you have any questions about your discharge instructions: Diet   No. Medications  No. Follow up visit  No.  Do you have questions or concerns about your Care? No.  Actions: * If pain score is 4 or above: No action needed, pain <4.  1. Have you developed a fever since your procedure? no  2.   Have you had an respiratory symptoms (SOB or cough) since your procedure? no  3.   Have you tested positive for COVID 19 since your procedure no  4.   Have you had any family members/close contacts diagnosed with the COVID 19 since your procedure?  no   If yes to any of these questions please route to Laverna Peace, RN and Charlett Lango, RN

## 2020-03-11 ENCOUNTER — Other Ambulatory Visit: Payer: Self-pay | Admitting: Gastroenterology

## 2020-03-11 MED ORDER — METRONIDAZOLE 250 MG PO TABS: 250.0000 mg | ORAL_TABLET | Freq: Four times a day (QID) | ORAL | 0 refills | Status: AC

## 2020-03-11 MED ORDER — DOXYCYCLINE HYCLATE 100 MG PO TABS: 100.0000 mg | ORAL_TABLET | Freq: Two times a day (BID) | ORAL | 0 refills | Status: AC

## 2020-03-11 MED ORDER — BISMUTH SUBSALICYLATE 262 MG PO CHEW: 524.0000 mg | CHEWABLE_TABLET | Freq: Four times a day (QID) | ORAL | 0 refills | Status: AC

## 2020-03-11 MED FILL — DOXYCYCLINE HYCLATE 100 MG: 100 | 14 days supply | Qty: 28 | Fill #0

## 2020-03-11 MED FILL — metroNIDAZOLE 250 MG TABS: 250 | 14 days supply | Qty: 56 | Fill #0

## 2020-04-22 ENCOUNTER — Encounter: Payer: No Typology Code available for payment source | Admitting: Gastroenterology

## 2020-05-16 ENCOUNTER — Encounter: Payer: No Typology Code available for payment source | Admitting: Family Medicine

## 2020-05-16 ENCOUNTER — Telehealth: Payer: Self-pay

## 2020-05-16 NOTE — Telephone Encounter (Signed)
[facility] Night - Client TELEPHONE ADVICE RECORD [facility] Patient Name: Stephanie [name] Gender: Female DOB: 08/19/1996 Age: 24 Y 5 M 22 D Return Phone Number: [phone] (Primary), [phone] (Secondary) [place] [address] Client [facility] Night - Client Client Site [facility] - Night Physician Vernona Rieger - NP Contact Type Call Who Is Calling Patient / Member / Family / Caregiver Call Type Triage / Clinical Relationship To Patient Self Return Phone Number [phone] (Primary) Chief Complaint WHEEZING Reason for Call Symptomatic / Request for Health Information Initial Comment Caller states she a cough. Duration 6 days. It is worsening. She has some SOB and wheezing. Translation No Nurse Assessment Nurse: Terrence Dupont, RN, Holly Date/Time (Eastern Time): 05/15/2020 11:19:30 AM Confirm and document reason for call. If symptomatic, describe symptoms. ---Caller states she a cough. Duration 6 days. It is worsening. She has some SOB and wheezing. no history of asthma, no fever. states the cough is croupy. having some nasal congestion. medicated with cough drops and musinex. took nyquil last night. cough is getting painful. caller states she is having a pain in the middle of back when she urinates. Has the patient had close contact with a person known or suspected to have the novel coronavirus illness OR traveled / lives in area with major community spread (including international travel) in the last 14 days from the onset of symptoms? * If Asymptomatic, screen for exposure and travel within the last 14 days. ---No Does the patient have any new or worsening symptoms? ---Yes Will a triage be completed? ---Yes Related visit to physician within the last 2 weeks? ---No Does the PT have any chronic conditions? (i.e. diabetes, asthma, this includes High risk factors for  pregnancy, etc.) ---No Is the patient pregnant or possibly pregnant? (Ask all females between the ages of 67-55) ---No Is this a behavioral health or substance abuse call? ---No Guidelines Guideline Title Affirmed Question Affirmed Notes Nurse Date/Time (Eastern Time) Breathing Difficulty [1] MILD difficulty breathing (e.g., minimal/ no SOB at rest, SOB with McClarnon, RN, [facility] 05/15/2020 11:22:24 AM PLEASE NOTE: All timestamps contained within this report are represented as [place] Standard Time. CONFIDENTIALTY NOTICE: This fax transmission is intended only for the addressee. It contains information that is legally privileged, confidential or otherwise protected from use or disclosure. If you are not the intended recipient, you are strictly prohibited from reviewing, disclosing, copying using or disseminating any of this information or taking any action in reliance on or regarding this information. If you have received this fax in error, please notify us immediately by telephone so that we can arrange for its return to [place]. Phone: [phone], Toll-Free: [phone], Fax: [phone] Page: 2 of 2 Call Id: [id number] Guidelines Guideline Title Affirmed Question Affirmed Notes Nurse Date/Time Lamount Cohen Time) walking, pulse <100) AND [2] NEW-onset or WORSE than normal Disp. Time Lamount Cohen Time) Disposition Final User 05/15/2020 11:18:22 AM Send to Urgent Queue Osborne Casco 05/15/2020 11:24:44 AM See HCP within 4 Hours (or PCP triage) Yes McClarnon, RN, Sharene Butters Disagree/Comply Comply Caller Understands Yes PreDisposition Did not know what to do Care Advice Given Per Guideline SEE HCP WITHIN 4 HOURS (OR PCP TRIAGE): * IF OFFICE WILL BE CLOSED AND NO PCP (PRIMARY CARE PROVIDER) SECOND-LEVEL TRIAGE: You need to be seen within the next 3 or 4 hours. A nearby Urgent Care Center [facility]) is often a good source of care. Another choice is to go to the ED.  Go sooner if you become  worse. CARE ADVICE given per Breathing Difficulty (Adult) guideline. * You become worse. CALL BACK IF: Referrals [facility]

## 2020-05-16 NOTE — Telephone Encounter (Signed)
Please try to reach patient one additional time to see how she is doing.  Schedule a virtual/in office visit if needed. Thanks!

## 2020-05-16 NOTE — Telephone Encounter (Signed)
Unable to reach pt or pts mom to see if she went to an UC and update on pts condition this morning. Will fwd to [facility] CMA and PCP.

## 2020-05-17 ENCOUNTER — Ambulatory Visit (HOSPITAL_COMMUNITY)
Admission: EM | Admit: 2020-05-17 | Discharge: 2020-05-17 | Disposition: A | Discharge status: Home / Self Care | Payer: No Typology Code available for payment source | Attending: Internal Medicine | Admitting: Internal Medicine

## 2020-05-17 ENCOUNTER — Encounter (HOSPITAL_COMMUNITY): Payer: Self-pay | Admitting: Emergency Medicine

## 2020-05-17 ENCOUNTER — Other Ambulatory Visit: Payer: Self-pay

## 2020-05-17 DIAGNOSIS — R0602 Shortness of breath: Secondary | ICD-10-CM | POA: Insufficient documentation

## 2020-05-17 DIAGNOSIS — J069 Acute upper respiratory infection, unspecified: Secondary | ICD-10-CM | POA: Diagnosis not present

## 2020-05-17 DIAGNOSIS — Z20822 Contact with and (suspected) exposure to covid-19: Secondary | ICD-10-CM | POA: Diagnosis not present

## 2020-05-17 DIAGNOSIS — R0789 Other chest pain: Secondary | ICD-10-CM | POA: Insufficient documentation

## 2020-05-17 LAB — SARS CORONAVIRUS 2 (TAT 6-24 HRS): SARS Coronavirus 2: NEGATIVE

## 2020-05-17 MED ORDER — ALBUTEROL SULFATE HFA 108 (90 BASE) MCG/ACT IN AERS: 1.0000 | INHALATION_SPRAY | Freq: Four times a day (QID) | RESPIRATORY_TRACT | 0 refills | Status: DC | PRN

## 2020-05-17 MED ORDER — PSEUDOEPHEDRINE HCL 60 MG PO TABS
60.0000 mg | ORAL_TABLET | Freq: Three times a day (TID) | ORAL | 0 refills | Status: DC | PRN
Start: 2020-05-17 — End: 2020-05-27

## 2020-05-17 MED ORDER — BENZONATATE 100 MG PO CAPS: 100.0000 mg | ORAL_CAPSULE | Freq: Three times a day (TID) | ORAL | 0 refills | Status: DC | PRN

## 2020-05-17 MED ORDER — PROMETHAZINE-DM 6.25-15 MG/5ML PO SYRP: 5.0000 mL | ORAL_SOLUTION | Freq: Every evening | ORAL | 0 refills | Status: DC | PRN

## 2020-05-17 MED ORDER — CETIRIZINE HCL 10 MG PO TABS: 10.0000 mg | ORAL_TABLET | Freq: Every day | ORAL | 0 refills | Status: DC

## 2020-05-17 MED FILL — ALBUTEROL SULFATE HFA 108 (: 108 (90 BAS | 25 days supply | Qty: 18 | Fill #0

## 2020-05-17 MED FILL — BENZONATATE 100 MG CAPS: 100 | 10 days supply | Qty: 60 | Fill #0

## 2020-05-17 MED FILL — PROMETHAZINE W/DM SYRUP: 6.25-15 | 20 days supply | Qty: 100 | Fill #0

## 2020-05-17 NOTE — Telephone Encounter (Signed)
Spoken to patient and she stated that she went to UC yesterday. Patient was tested covid but results have not come back yet. She was given a a couple Rx which included an inhaler for wheezing. Patient is feeling about the same as yesterday but no worse. She will update if needed

## 2020-05-17 NOTE — Telephone Encounter (Signed)
Noted  

## 2020-05-17 NOTE — ED Provider Notes (Signed)
[facility]   MRN: [id number] DOB: [birth year]  Subjective:   Stephanie [name] is a 24 y.o. female presenting for 1 week hx of dry hacking cough that elicits chest pain and shortness of breath.  Patient states that she also has had really stuffy nose, nasal congestion at night and when she wakes up.  Denies history of smoking, asthma but states that a couple of members in her family do have trouble with asthma.  She has trouble with allergic rhinitis but is not taking anything consistently for this.  Has not had Covid vaccination.   Current Facility-Administered Medications:  .  0.9 %  sodium chloride infusion, 500 mL, Intravenous, Once, Cirigliano, Vito V, DO  Current Outpatient Medications:  .  dicyclomine (BENTYL) 20 MG tablet, Take 1 tablet (20 mg total) by mouth 2 (two) times daily., Disp: 20 tablet, Rfl: 0 .  etonogestrel (NEXPLANON) 68 MG IMPL implant, 1 each by Subdermal route once., Disp: , Rfl:  .  famotidine (PEPCID) 20 MG tablet, Take 1 tablet (20 mg total) by mouth 2 (two) times daily., Disp: 30 tablet, Rfl: 0 .  ondansetron (ZOFRAN ODT) 8 MG disintegrating tablet, Take 1 tablet (8 mg total) by mouth every 8 (eight) hours as needed for nausea or vomiting., Disp: 20 tablet, Rfl: 0 .  pantoprazole (PROTONIX) 40 MG tablet, Take 40 mg twice a day for 8 weeks, Disp: 120 tablet, Rfl: 1 .  promethazine (PHENERGAN) 25 MG suppository, Place 1 suppository (25 mg total) rectally every 6 (six) hours as needed for nausea or vomiting. (Patient not taking: Reported on 03/04/2020), Disp: 12 each, Rfl: 0 .  sucralfate (CARAFATE) 1 GM/10ML suspension, Take 10 mLs (1 g total) by mouth 4 (four) times daily -  with meals and at bedtime., Disp: 420 mL, Rfl: 0   Allergies  Allergen Reactions  . Coconut Flavor Other (See Comments)    Mouth itches, but no impaired breathing (per patient  . Septra [Sulfamethoxazole-Trimethoprim] Nausea Only and Other (See Comments)    Patient took a few  courses of this and it ended up being stopped by a MD because it had started causing "bad stomach pain" (per the patient)    Past Medical History:  Diagnosis Date  . Acute pancreatitis without infection or necrosis 04/21/2019  . Anemia   . Anxiety 01/28/2013   no meds  . Depression    no meds  . IBS (irritable bowel syndrome)   . Migraines    HA every other day - otc med prn  . Recurrent streptococcal tonsillitis   . Seasonal allergies    Seasonal environmental allergies  . Sebaceous cyst 08/04/2018   L mid back   . Vision abnormalities    Myopia; patient has history of devloping ulcers when she wore contacts.     Past Surgical History:  Procedure Laterality Date  . COLONOSCOPY     IBS  . COLONOSCOPY     IBS  . LAPAROSCOPY N/A 10/20/2018   Procedure: LAPAROSCOPY DIAGNOSTIC possible removal of endometriosis;  Surgeon: Mitchel Honour, DO;  Location: [facility];  Service: Gynecology;  Laterality: N/A;  . UPPER GASTROINTESTINAL ENDOSCOPY  03/04/2020    Family History  Problem Relation Age of Onset  . Depression Mother   . Migraines Mother   . Irritable bowel syndrome Mother   . Hypertension Mother   . Migraines Father   . Diabetes Father   . Diabetes Maternal Grandmother   . Hypertension Maternal Grandmother   .  Diabetes Sister   . Stomach cancer Sister 30  . Colon cancer Neg Hx   . Esophageal cancer Neg Hx   . Rectal cancer Neg Hx     Social History   Tobacco Use  . Smoking status: Never Smoker  . Smokeless tobacco: Never Used  Vaping Use  . Vaping Use: Never used  Substance Use Topics  . Alcohol use: Yes    Comment: socially  . Drug use: Yes    Types: Marijuana    Comment: Last use Thursday 10/16/18    ROS   Objective:   Vitals: BP 120/82 (BP Location: Left Arm)   Pulse 98   Temp 98.6 F (37 C) (Oral)   Resp 18   LMP 04/14/2020   SpO2 100%   Physical Exam Constitutional:      General: She is not in acute distress.    Appearance: Normal  appearance. She is well-developed. She is not ill-appearing, toxic-appearing or diaphoretic.  HENT:     Head: Normocephalic and atraumatic.     Nose: Congestion present.     Mouth/Throat:     Mouth: Mucous membranes are moist.     Pharynx: No oropharyngeal exudate or posterior oropharyngeal erythema.     Comments: Thick streaks of postnasal drainage overlying pharynx. Eyes:     General: No scleral icterus.       Right eye: No discharge.        Left eye: No discharge.     Extraocular Movements: Extraocular movements intact.     Conjunctiva/sclera: Conjunctivae normal.     Pupils: Pupils are equal, round, and reactive to light.  Cardiovascular:     Rate and Rhythm: Normal rate and regular rhythm.     Pulses: Normal pulses.     Heart sounds: Normal heart sounds. No murmur heard.  No friction rub. No gallop.   Pulmonary:     Effort: Pulmonary effort is normal. No respiratory distress.     Breath sounds: Normal breath sounds. No stridor. No wheezing, rhonchi or rales.  Skin:    General: Skin is warm and dry.     Findings: No rash.  Neurological:     Mental Status: She is alert and oriented to person, place, and time.  Psychiatric:        Mood and Affect: Mood normal.        Behavior: Behavior normal.        Thought Content: Thought content normal.        Judgment: Judgment normal.      Assessment and Plan :   PDMP not reviewed this encounter.  1. Viral URI with cough   2. Atypical chest pain   3. Shortness of breath     Will manage for viral illness such as viral URI, viral syndrome, viral rhinitis, COVID-19. Counseled patient on nature of COVID-19 including modes of transmission, diagnostic testing, management and supportive care.  Offered scripts for symptomatic relief. COVID 19 testing is pending. Counseled patient on potential for adverse effects with medications prescribed/recommended today, ER and return-to-clinic precautions discussed, patient verbalized understanding.     Wallis Bamberg, PA-C 05/17/20 1322

## 2020-05-17 NOTE — Progress Notes (Signed)
Pt preferred in-person and canceled appointment

## 2020-05-17 NOTE — ED Triage Notes (Signed)
Pt cough x 1 week with chest pain after and SOB, she gets a pain in her mid back after coughing. Pt states that she feels worse in the morning upon waking. Pt states this is sometimes a productive cough but it is mostly dry.

## 2020-05-26 MED FILL — PROMETHAZINE W/DM SYRUP: 6.25-15 | 20 days supply | Qty: 100 | Fill #0

## 2020-05-26 MED FILL — BENZONATATE 100 MG CAPS: 100 | 10 days supply | Qty: 60 | Fill #0

## 2020-05-26 MED FILL — ALBUTEROL SULFATE HFA 108 (: 108 (90 BAS | 25 days supply | Qty: 18 | Fill #0

## 2020-05-27 ENCOUNTER — Telehealth: Payer: Self-pay | Admitting: Primary Care

## 2020-05-27 ENCOUNTER — Other Ambulatory Visit: Payer: Self-pay

## 2020-05-27 ENCOUNTER — Ambulatory Visit (HOSPITAL_COMMUNITY)
Admission: EM | Admit: 2020-05-27 | Discharge: 2020-05-27 | Disposition: A | Discharge status: Home / Self Care | Payer: No Typology Code available for payment source | Attending: Family Medicine | Admitting: Family Medicine

## 2020-05-27 DIAGNOSIS — N946 Dysmenorrhea, unspecified: Secondary | ICD-10-CM | POA: Diagnosis not present

## 2020-05-27 DIAGNOSIS — Z3202 Encounter for pregnancy test, result negative: Secondary | ICD-10-CM | POA: Diagnosis not present

## 2020-05-27 LAB — POCT URINALYSIS DIP (DEVICE)
Bilirubin Urine: NEGATIVE
Glucose, UA: NEGATIVE mg/dL
Ketones, ur: 15 mg/dL — AB
Leukocytes,Ua: NEGATIVE
Nitrite: NEGATIVE
Protein, ur: NEGATIVE mg/dL
Specific Gravity, Urine: >=1.03 (ref 1.005–1.030)
Urobilinogen, UA: 0.2 mg/dL (ref 0.0–1.0)
pH: 6 (ref 5.0–8.0)

## 2020-05-27 LAB — POC URINE PREG, ED: Preg Test, Ur: NEGATIVE

## 2020-05-27 MED ORDER — KETOROLAC TROMETHAMINE 30 MG/ML IJ SOLN
INTRAMUSCULAR | Status: AC
  Filled 2020-05-27: qty 1

## 2020-05-27 MED ORDER — KETOROLAC TROMETHAMINE 30 MG/ML IJ SOLN
30.0000 mg | Freq: Once | INTRAMUSCULAR | Status: AC
  Administered 2020-05-27: 30 mg via INTRAMUSCULAR

## 2020-05-27 MED ORDER — ONDANSETRON 4 MG PO TBDP: 4.0000 mg | ORAL_TABLET | Freq: Three times a day (TID) | ORAL | 0 refills | Status: DC | PRN

## 2020-05-27 MED ORDER — NAPROXEN 375 MG PO TABS
375.0000 mg | ORAL_TABLET | Freq: Three times a day (TID) | ORAL | 0 refills | Status: DC
Start: 2020-05-27 — End: 2020-08-30

## 2020-05-27 MED FILL — NAPROXEN 375 MG TABLET: 375 | 10 days supply | Qty: 30 | Fill #0

## 2020-05-27 MED FILL — ONDANSETRON ODT 4 MG TABLET: 4 | 7 days supply | Qty: 40 | Fill #0

## 2020-05-27 NOTE — Telephone Encounter (Signed)
Spoken and notified patient of Kate Clark's comments. Verbalized understanding. ° °

## 2020-05-27 NOTE — ED Provider Notes (Signed)
[facility]    CSN: [id number] Arrival date & time: 05/27/20  1037      History   Chief Complaint Chief Complaint  Patient presents with  . period cramps    HPI [name] is a 24 y.o. female.   HPI  Patient has history of dysmenorrhea With her menstrual period she gets vomiting and abdominal pain She took Zofran 4 mg this morning and is still having some vomiting She is unable to work today so is here for treatment and a work note She called her primary care doctor and has a referral in place to see GYN, she has a Nexplanon in place, and has had a trial of oral contraceptives She took acetaminophen 1000 mg today with little relief of pain   Past Medical History:  Diagnosis Date  . Acute pancreatitis without infection or necrosis 04/21/2019  . Anemia   . Anxiety 01/28/2013   no meds  . Depression    no meds  . IBS (irritable bowel syndrome)   . Migraines    HA every other day - otc med prn  . Recurrent streptococcal tonsillitis   . Seasonal allergies    Seasonal environmental allergies  . Sebaceous cyst 08/04/2018   L mid back   . Vision abnormalities    Myopia; patient has history of devloping ulcers when she wore contacts.    Patient Active Problem List   Diagnosis Date Noted  . Flank pain 07/10/2019  . Near syncope 06/05/2019  . Nausea and vomiting 04/20/2019  . Axillary hidradenitis suppurativa 03/05/2019  . Recurrent infection of skin 02/04/2019  . Preventative health care 11/20/2017  . Menorrhagia with regular cycle 09/24/2014  . Migraines 08/04/2014  . Allergic rhinitis 04/03/2013  . MDD (major depressive disorder), single episode, severe (HCC) 01/28/2013    Class: Chronic  . Generalized anxiety disorder 01/28/2013    Past Surgical History:  Procedure Laterality Date  . COLONOSCOPY     IBS  . COLONOSCOPY     IBS  . LAPAROSCOPY N/A 10/20/2018   Procedure: LAPAROSCOPY DIAGNOSTIC possible removal of endometriosis;  Surgeon:  Mitchel Honour, DO;  Location: [facility];  Service: Gynecology;  Laterality: N/A;  . UPPER GASTROINTESTINAL ENDOSCOPY  03/04/2020    OB History    Gravida  0   Para  0   Term  0   Preterm  0   AB  0   Living  0     SAB  0   TAB  0   Ectopic  0   Multiple  0   Live Births  0        Obstetric Comments  Menstrual age: 89  Age 1st Pregnancy: N/A          Home Medications    Prior to Admission medications   Medication Sig Start Date End Date Taking? Authorizing Provider  albuterol (VENTOLIN HFA) 108 (90 Base) MCG/ACT inhaler Inhale 1-2 puffs into the lungs every 6 (six) hours as needed for wheezing or shortness of breath. 05/17/20   Wallis Bamberg, PA-C  cetirizine (ZYRTEC ALLERGY) 10 MG tablet Take 1 tablet (10 mg total) by mouth daily. 05/17/20   Wallis Bamberg, PA-C  etonogestrel (NEXPLANON) 68 MG IMPL implant 1 each by Subdermal route once. 09/30/18   [provider]  naproxen (NAPROSYN) 375 MG tablet Take 1 tablet (375 mg total) by mouth 3 (three) times daily with meals. 05/27/20   Eustace Moore, MD  ondansetron [facility]  ODT) 4 MG disintegrating tablet Take 1-2 tablets (4-8 mg total) by mouth every 8 (eight) hours as needed for nausea or vomiting. 05/27/20   Eustace Moore, MD  dicyclomine (BENTYL) 20 MG tablet Take 1 tablet (20 mg total) by mouth 2 (two) times daily. 10/26/19 05/17/20  Joy, Shawn C, PA-C  famotidine (PEPCID) 20 MG tablet Take 1 tablet (20 mg total) by mouth 2 (two) times daily. 02/07/20 05/17/20  Henderly, Britni A, PA-C  metoCLOPramide (REGLAN) 10 MG tablet Take 1 tablet (10 mg total) by mouth every 6 (six) hours as needed for nausea or vomiting. Patient not taking: Reported on 09/19/2019 06/19/19 09/19/19  Caccavale, Sophia, PA-C  pantoprazole (PROTONIX) 40 MG tablet Take 40 mg twice a day for 8 weeks 03/04/20 05/17/20  Cirigliano, Verlin Dike, DO  promethazine (PHENERGAN) 25 MG suppository Place 1 suppository (25 mg total) rectally every 6 (six) hours  as needed for nausea or vomiting. Patient not taking: Reported on 03/04/2020 02/07/20 05/17/20  Henderly, Britni A, PA-C  sucralfate (CARAFATE) 1 GM/10ML suspension Take 10 mLs (1 g total) by mouth 4 (four) times daily -  with meals and at bedtime. 02/07/20 05/17/20  Henderly, Britni A, PA-C    Family History Family History  Problem Relation Age of Onset  . Depression Mother   . Migraines Mother   . Irritable bowel syndrome Mother   . Hypertension Mother   . Migraines Father   . Diabetes Father   . Diabetes Maternal Grandmother   . Hypertension Maternal Grandmother   . Diabetes Sister   . Stomach cancer Sister 78  . Colon cancer Neg Hx   . Esophageal cancer Neg Hx   . Rectal cancer Neg Hx     Social History Social History   Tobacco Use  . Smoking status: Never Smoker  . Smokeless tobacco: Never Used  Vaping Use  . Vaping Use: Never used  Substance Use Topics  . Alcohol use: Yes    Comment: socially  . Drug use: Yes    Types: Marijuana    Comment: Last use Thursday 10/16/18     Allergies   Coconut flavor and Septra [sulfamethoxazole-trimethoprim]   Review of Systems Review of Systems  Genitourinary: Positive for menstrual problem.     Physical Exam Triage Vital Signs ED Triage Vitals  Enc Vitals Group     BP 05/27/20 1109 (!) 125/91     Pulse Rate 05/27/20 1109 88     Resp 05/27/20 1109 16     Temp 05/27/20 1109 98.5 F (36.9 C)     Temp Source 05/27/20 1109 Oral     SpO2 05/27/20 1109 100 %     Weight --      Height --      Head Circumference --      Peak Flow --      Pain Score 05/27/20 1111 10     Pain Loc --      Pain Edu? --      Excl. in GC? --    No data found.  Updated Vital Signs BP (!) 125/91 (BP Location: Right Arm)   Pulse 88   Temp 98.5 F (36.9 C) (Oral)   Resp 16   LMP 05/27/2020 (Exact Date)   SpO2 100%      Physical Exam Constitutional:      General: She is not in acute distress.    Appearance: She is well-developed.    HENT:     Head: Normocephalic  and atraumatic.  Eyes:     Conjunctiva/sclera: Conjunctivae normal.     Pupils: Pupils are equal, round, and reactive to light.  Cardiovascular:     Rate and Rhythm: Normal rate.  Pulmonary:     Effort: Pulmonary effort is normal. No respiratory distress.  Abdominal:     General: Abdomen is flat. Bowel sounds are normal. There is no distension.     Palpations: Abdomen is soft.     Tenderness: There is abdominal tenderness.     Comments: Very mild tenderness lower abdomen  Musculoskeletal:        General: Normal range of motion.     Cervical back: Normal range of motion.  Skin:    General: Skin is warm and dry.  Neurological:     Mental Status: She is alert.      UC Treatments / Results  Labs (all labs ordered are listed, but only abnormal results are displayed) Labs Reviewed  POCT URINALYSIS DIP (DEVICE) - Abnormal; Notable for the following components:      Result Value   Ketones, ur 15 (*)    Hgb urine dipstick SMALL (*)    All other components within normal limits  POC URINE PREG, ED    EKG   Radiology No results found.  Procedures Procedures (including critical care time)  Medications Ordered in UC Medications  ketorolac (TORADOL) 30 MG/ML injection 30 mg (30 mg Intramuscular Given 05/27/20 1149)    Initial Impression / Assessment and Plan / UC Course  I have reviewed the triage vital signs and the nursing notes.  Pertinent labs & imaging results that were available during my care of the patient were reviewed by me and considered in my medical decision making (see chart for details).     Reviewed proper use of naproxen for dysmenorrhea Reviewed increasing Zofran if needed  Final Clinical Impressions(s) / UC Diagnoses   Final diagnoses:  Dysmenorrhea     Discharge Instructions     Take naproxen 2-3 times a day for menstrual cramps Try to start this at the first onset of your menstrual period Take Zofran as needed  for nausea See GYN in follow-up   ED Prescriptions    Medication Sig Dispense Auth. Provider   ondansetron (ZOFRAN ODT) 4 MG disintegrating tablet Take 1-2 tablets (4-8 mg total) by mouth every 8 (eight) hours as needed for nausea or vomiting. 40 tablet Eustace Moore, MD   naproxen (NAPROSYN) 375 MG tablet Take 1 tablet (375 mg total) by mouth 3 (three) times daily with meals. 30 tablet Eustace Moore, MD     PDMP not reviewed this encounter.   Eustace Moore, MD 05/27/20 [phone]

## 2020-05-27 NOTE — Telephone Encounter (Signed)
Patient called.  Patient's requesting a referral to a gynecologist.  Patient prefers [place].  Patient prefers a morning appointment.  Patient said she has discussed with Jae Dire that during menses patient has a stabbing pain in her back and hips and throwing up.  Patient said it's has gotten worse. Patient said it's hard to function at work.  Patient said if she can get someone to take her, she'll be going to an urgent care today.

## 2020-05-27 NOTE — Telephone Encounter (Signed)
Noted, referral placed to GYN. Please notify patient.

## 2020-05-27 NOTE — ED Triage Notes (Signed)
Pt presents with nausea, and stomach pain r/t menstruation. Pt states she has Zofran prescription for nausea, has been taking with out relief. Pt also states she has been treating pain with tylenol with out relief, last dose 1000 pt unsure if dose was thrown up. Pt denies  Fever, chills, body aches. Last emesis 1000 this morning.

## 2020-05-27 NOTE — Discharge Instructions (Addendum)
Take naproxen 2-3 times a day for menstrual cramps Try to start this at the first onset of your menstrual period Take Zofran as needed for nausea See GYN in follow-up

## 2020-06-16 ENCOUNTER — Ambulatory Visit: Payer: No Typology Code available for payment source | Admitting: Nurse Practitioner

## 2020-07-11 ENCOUNTER — Telehealth: Payer: Self-pay

## 2020-07-11 ENCOUNTER — Encounter: Payer: Self-pay | Admitting: Emergency Medicine

## 2020-07-11 ENCOUNTER — Other Ambulatory Visit: Payer: Self-pay

## 2020-07-11 ENCOUNTER — Ambulatory Visit
Admission: EM | Admit: 2020-07-11 | Discharge: 2020-07-11 | Disposition: A | Discharge status: Home / Self Care | Payer: No Typology Code available for payment source | Attending: Emergency Medicine | Admitting: Emergency Medicine

## 2020-07-11 DIAGNOSIS — L02411 Cutaneous abscess of right axilla: Secondary | ICD-10-CM

## 2020-07-11 MED ORDER — HYDROCODONE-ACETAMINOPHEN 5-325 MG PO TABS: 1.0000 | ORAL_TABLET | Freq: Four times a day (QID) | ORAL | 0 refills | Status: DC | PRN

## 2020-07-11 MED ORDER — AMOXICILLIN-POT CLAVULANATE 875-125 MG PO TABS
1.0000 | ORAL_TABLET | Freq: Two times a day (BID) | ORAL | 0 refills | Status: DC
Start: 2020-07-11 — End: 2020-08-30

## 2020-07-11 MED FILL — HYDROCODON-APAP 5-325: 5-325 | 2 days supply | Qty: 8 | Fill #0

## 2020-07-11 MED FILL — AMOX-CLAV 875-125 MG TABLET: 875-125 | 7 days supply | Qty: 14 | Fill #0

## 2020-07-11 NOTE — Telephone Encounter (Signed)
Pt called asking to speak to a nurse. Said she had called the nurse line several times but did not leave a message. Has been in pain for several days with swelling from axillary hidradenitis and thinks it may need to be drained or have an antibiotic. Asked what to do. I advised I was a CMA in the office. Kate's schedule was full. I offered an OV with Debbie at 4pm today. She did not want to wait. She is probably going to to to the [facility] at the [facility]. I told her not to go to the ER due to the long wait.

## 2020-07-11 NOTE — Telephone Encounter (Signed)
Noted, patient currently at [facility].

## 2020-07-11 NOTE — ED Triage Notes (Signed)
Pt here for abscess to right axillary area with hx of HS; pt sts increased pain

## 2020-07-11 NOTE — Discharge Instructions (Addendum)
Keep area(s) clean and dry. °Apply hot compress / towel for 5-10 minutes 3-5 times daily. °Take antibiotic as prescribed with food - important to complete course. °Return for worsening pain, redness, swelling, discharge, fever. ° °Helpful prevention tips: °Keep nails short to avoid secondary skin infections. °Use new, clean razors when shaving. °Avoid antiperspirants - look for deodorants without aluminum. °Avoid wearing underwire bras as this can irritate the area further.  °

## 2020-07-11 NOTE — ED Provider Notes (Addendum)
[facility]    CSN: [id number] Arrival date & time: 07/11/20  1144      History   Chief Complaint Chief Complaint  Patient presents with  . Abscess    HPI Stephanie [name] is a 24 y.o. female presenting for right axillary abscess.  Noticed this a few days ago, pain and swelling became worse over the last few days.  Unable to apply deodorant second pain.  No chest pain, palpitations, fever, discharge.   Past Medical History:  Diagnosis Date  . Acute pancreatitis without infection or necrosis 04/21/2019  . Anemia   . Anxiety 01/28/2013   no meds  . Depression    no meds  . IBS (irritable bowel syndrome)   . Migraines    HA every other day - otc med prn  . Recurrent streptococcal tonsillitis   . Seasonal allergies    Seasonal environmental allergies  . Sebaceous cyst 08/04/2018   L mid back   . Vision abnormalities    Myopia; patient has history of devloping ulcers when she wore contacts.    Patient Active Problem List   Diagnosis Date Noted  . Flank pain 07/10/2019  . Near syncope 06/05/2019  . Nausea and vomiting 04/20/2019  . Axillary hidradenitis suppurativa 03/05/2019  . Recurrent infection of skin 02/04/2019  . Preventative health care 11/20/2017  . Menorrhagia with regular cycle 09/24/2014  . Migraines 08/04/2014  . Allergic rhinitis 04/03/2013  . MDD (major depressive disorder), single episode, severe (HCC) 01/28/2013    Class: Chronic  . Generalized anxiety disorder 01/28/2013    Past Surgical History:  Procedure Laterality Date  . COLONOSCOPY     IBS  . COLONOSCOPY     IBS  . LAPAROSCOPY N/A 10/20/2018   Procedure: LAPAROSCOPY DIAGNOSTIC possible removal of endometriosis;  Surgeon: Mitchel Honour, DO;  Location: [facility];  Service: Gynecology;  Laterality: N/A;  . UPPER GASTROINTESTINAL ENDOSCOPY  03/04/2020    OB History    Gravida  0   Para  0   Term  0   Preterm  0   AB  0   Living  0     SAB  0   TAB  0    Ectopic  0   Multiple  0   Live Births  0        Obstetric Comments  Menstrual age: 74  Age 1st Pregnancy: N/A          Home Medications    Prior to Admission medications   Medication Sig Start Date End Date Taking? Authorizing Provider  albuterol (VENTOLIN HFA) 108 (90 Base) MCG/ACT inhaler Inhale 1-2 puffs into the lungs every 6 (six) hours as needed for wheezing or shortness of breath. 05/17/20   Wallis Bamberg, PA-C  amoxicillin-clavulanate (AUGMENTIN) 875-125 MG tablet Take 1 tablet by mouth every 12 (twelve) hours. 07/11/20   Hall-Potvin, [place], PA-C  cetirizine (ZYRTEC ALLERGY) 10 MG tablet Take 1 tablet (10 mg total) by mouth daily. 05/17/20   Wallis Bamberg, PA-C  etonogestrel (NEXPLANON) 68 MG IMPL implant 1 each by Subdermal route once. 09/30/18   [provider]  HYDROcodone-acetaminophen (NORCO/VICODIN) 5-325 MG tablet Take 1 tablet by mouth every 6 (six) hours as needed. 07/11/20   Hall-Potvin, [place], PA-C  naproxen (NAPROSYN) 375 MG tablet Take 1 tablet (375 mg total) by mouth 3 (three) times daily with meals. 05/27/20   Eustace Moore, MD  ondansetron (ZOFRAN ODT) 4 MG disintegrating tablet Take 1-2 tablets (  4-8 mg total) by mouth every 8 (eight) hours as needed for nausea or vomiting. 05/27/20   Eustace Moore, MD  dicyclomine (BENTYL) 20 MG tablet Take 1 tablet (20 mg total) by mouth 2 (two) times daily. 10/26/19 05/17/20  Joy, Shawn C, PA-C  famotidine (PEPCID) 20 MG tablet Take 1 tablet (20 mg total) by mouth 2 (two) times daily. 02/07/20 05/17/20  Henderly, Britni A, PA-C  metoCLOPramide (REGLAN) 10 MG tablet Take 1 tablet (10 mg total) by mouth every 6 (six) hours as needed for nausea or vomiting. Patient not taking: Reported on 09/19/2019 06/19/19 09/19/19  Caccavale, Sophia, PA-C  pantoprazole (PROTONIX) 40 MG tablet Take 40 mg twice a day for 8 weeks 03/04/20 05/17/20  Cirigliano, Verlin Dike, DO  promethazine (PHENERGAN) 25 MG suppository Place 1 suppository  (25 mg total) rectally every 6 (six) hours as needed for nausea or vomiting. Patient not taking: Reported on 03/04/2020 02/07/20 05/17/20  Henderly, Britni A, PA-C  sucralfate (CARAFATE) 1 GM/10ML suspension Take 10 mLs (1 g total) by mouth 4 (four) times daily -  with meals and at bedtime. 02/07/20 05/17/20  Henderly, Britni A, PA-C    Family History Family History  Problem Relation Age of Onset  . Depression Mother   . Migraines Mother   . Irritable bowel syndrome Mother   . Hypertension Mother   . Migraines Father   . Diabetes Father   . Diabetes Maternal Grandmother   . Hypertension Maternal Grandmother   . Diabetes Sister   . Stomach cancer Sister 93  . Colon cancer Neg Hx   . Esophageal cancer Neg Hx   . Rectal cancer Neg Hx     Social History Social History   Tobacco Use  . Smoking status: Never Smoker  . Smokeless tobacco: Never Used  Vaping Use  . Vaping Use: Never used  Substance Use Topics  . Alcohol use: Yes    Comment: socially  . Drug use: Yes    Types: Marijuana    Comment: Last use Thursday 10/16/18     Allergies   Coconut flavor and Septra [sulfamethoxazole-trimethoprim]   Review of Systems As per HPI  Physical Exam Triage Vital Signs ED Triage Vitals  Enc Vitals Group     BP      Pulse      Resp      Temp      Temp src      SpO2      Weight      Height      Head Circumference      Peak Flow      Pain Score      Pain Loc      Pain Edu?      Excl. in GC?    No data found.  Updated Vital Signs BP 124/89 (BP Location: Right Arm)   Pulse 100   Temp 99.5 F (37.5 C) (Oral)   Resp 18   SpO2 97%   Visual Acuity Right Eye Distance:   Left Eye Distance:   Bilateral Distance:    Right Eye Near:   Left Eye Near:    Bilateral Near:     Physical Exam Constitutional:      General: She is not in acute distress. HENT:     Head: Normocephalic and atraumatic.  Eyes:     General: No scleral icterus.    Pupils: Pupils are equal,  round, and reactive to light.  Cardiovascular:  Rate and Rhythm: Normal rate.  Pulmonary:     Effort: Pulmonary effort is normal.  Skin:    Coloration: Skin is not jaundiced or pale.     Comments: Right axilla with 2.5 cm axillary abscess.  Fluctuance present.  No discharge, streaking.  Exquisite TTP.  Neurological:     Mental Status: She is alert and oriented to person, place, and time.      UC Treatments / Results  Labs (all labs ordered are listed, but only abnormal results are displayed) Labs Reviewed - No data to display  EKG   Radiology No results found.  Procedures Incision and Drainage  Date/Time: 07/11/2020 1:19 PM Performed by: Shea Evans, PA-C Authorized by: Shea Evans, PA-C   Consent:    Consent obtained:  Verbal   Consent given by:  Patient   Risks discussed:  Bleeding, incomplete drainage, pain and damage to other organs   Alternatives discussed:  No treatment Universal protocol:    Patient identity confirmed:  Verbally with patient Location:    Type:  Abscess   Size:  2.5cm Pre-procedure details:    Skin preparation:  Betadine Anesthesia (see MAR for exact dosages):    Anesthesia method:  Local infiltration   Local anesthetic:  Lidocaine 2% w/o epi Procedure type:    Complexity:  Simple Procedure details:    Needle aspiration: no     Incision types:  Single straight   Incision depth:  Subcutaneous   Scalpel blade:  11   Wound management:  Probed and deloculated, irrigated with saline and extensive cleaning   Drainage:  Bloody   Drainage amount:  Moderate   Wound treatment:  Wound left open   Packing materials:  None Post-procedure details:    Patient tolerance of procedure:  Tolerated with difficulty   (including critical care time)  Medications Ordered in UC Medications - No data to display  Initial Impression / Assessment and Plan / UC Course  I have reviewed the triage vital signs and the nursing  notes.  Pertinent labs & imaging results that were available during my care of the patient were reviewed by me and considered in my medical decision making (see chart for details).     I&D attempted in office: Unsuccessful.  Will start a medicine, hydrocodone for pain.  Return precautions discussed, pt verbalized understanding and is agreeable to plan. Final Clinical Impressions(s) / UC Diagnoses   Final diagnoses:  Abscess of right axilla     Discharge Instructions     Keep area(s) clean and dry. Apply hot compress / towel for 5-10 minutes 3-5 times daily. Take antibiotic as prescribed with food - important to complete course. Return for worsening pain, redness, swelling, discharge, fever.  Helpful prevention tips: Keep nails short to avoid secondary skin infections. Use new, clean razors when shaving. Avoid antiperspirants - look for deodorants without aluminum. Avoid wearing underwire bras as this can irritate the area further.     ED Prescriptions    Medication Sig Dispense Auth. Provider   HYDROcodone-acetaminophen (NORCO/VICODIN) 5-325 MG tablet Take 1 tablet by mouth every 6 (six) hours as needed. 8 tablet Hall-Potvin, [place], PA-C   amoxicillin-clavulanate (AUGMENTIN) 875-125 MG tablet Take 1 tablet by mouth every 12 (twelve) hours. 14 tablet Hall-Potvin, [place], PA-C     I have reviewed the PDMP during this encounter.   Hall-Potvin, [place], PA-C 07/11/20 1318    Hall-Potvin, [place], [place] 07/11/20 1321

## 2020-07-21 ENCOUNTER — Ambulatory Visit: Payer: No Typology Code available for payment source | Admitting: Nurse Practitioner

## 2020-07-21 DIAGNOSIS — Z0289 Encounter for other administrative examinations: Secondary | ICD-10-CM

## 2020-08-17 ENCOUNTER — Other Ambulatory Visit: Payer: Self-pay | Admitting: Primary Care

## 2020-08-30 ENCOUNTER — Other Ambulatory Visit: Payer: Self-pay

## 2020-08-30 ENCOUNTER — Encounter: Payer: Self-pay | Admitting: Primary Care

## 2020-08-30 ENCOUNTER — Telehealth (INDEPENDENT_AMBULATORY_CARE_PROVIDER_SITE_OTHER): Payer: No Typology Code available for payment source | Admitting: Primary Care

## 2020-08-30 VITALS — Ht 62.75 in | Wt 115.0 lb

## 2020-08-30 DIAGNOSIS — N946 Dysmenorrhea, unspecified: Secondary | ICD-10-CM | POA: Diagnosis not present

## 2020-08-30 DIAGNOSIS — R112 Nausea with vomiting, unspecified: Secondary | ICD-10-CM

## 2020-08-30 MED ORDER — NAPROXEN 375 MG PO TABS: 375.0000 mg | ORAL_TABLET | Freq: Two times a day (BID) | ORAL | 0 refills | Status: DC

## 2020-08-30 MED ORDER — ONDANSETRON 4 MG PO TBDP: 4.0000 mg | ORAL_TABLET | Freq: Three times a day (TID) | ORAL | 0 refills | Status: DC | PRN

## 2020-08-30 MED FILL — ONDANSETRON ODT 4 MG TABLET: 4 | 5 days supply | Qty: 30 | Fill #0

## 2020-08-30 MED FILL — NAPROXEN 375 MG TABLET: 375 | 30 days supply | Qty: 60 | Fill #0

## 2020-08-30 NOTE — Progress Notes (Addendum)
Subjective:    Patient ID: Stephanie [name], female    DOB: April 25, 1996, 24 y.o.   MRN: [id number]  HPI  Virtual Visit via Video Note  I connected with Stephanie [name] on 08/30/20 at 10:20 AM EDT by a video enabled telemedicine application and verified that I am speaking with the correct person using two identifiers.  Location: Patient: Home Provider: Office Participants: Patient and myself   I discussed the limitations of evaluation and management by telemedicine and the availability of in person appointments. The patient expressed understanding and agreed to proceed.  History of Present Illness:  Stephanie [name] is a 24 year old female with a history of migraines, near syncope, allergic rhinitis, MDD, GAD who presents today for medication refill.  She is requesting refills of Zofran and Naproxen. She takes her Naproxen as needing during menstrual cycles. She will mostly take just before her cycle begins and for the first 3 days of menses.   Long history of chronic abdominal pain with nausea and vomiting. She was taking Zofran every day to every other day for symptoms, but she ran out a few weeks ago. She is nauseated everyday which occurs with and without eating. He's been out of her Zofran for a few weeks so she's been chewing mint gum or smelling peppermint oil or alcohol swabs. She will vomit someday's with nausea.   She is following with GI who is requesting she undergo repeat endoscopy after treatment of H pylori that was discovered after her endoscopy in APril 2021. She underwent complete treatment for H pylori including pepto bismol, metronidazole, doxycycline, and PPI.  She plans on calling GI to schedule another endoscopy but has had a hard time getting off work.  After treatment for H pylori she thinks she may have felt better for a short period of time, but now she's back to experiencing daily nausea, decreased appetite, generalized weakness, generalized abdominal  cramping.   Observations/Objective:  Alert and oriented. Appears well, not sickly. No distress. Speaking in complete sentences.   Assessment and Plan:  See problem based charting.  Follow Up Instructions:  Call GI to follow up regarding your constant nausea with vomiting.  Use the Zofran sparingly, this is not meant to be used daily.  Use the naproxen as needed for menses.   It was a pleasure to see you today! Mayra Reel, NP-C    I discussed the assessment and treatment plan with the patient. The patient was provided an opportunity to ask questions and all were answered. The patient agreed with the plan and demonstrated an understanding of the instructions.   The patient was advised to call back or seek an in-person evaluation if the symptoms worsen or if the condition fails to improve as anticipated.    Doreene Nest, NP    Review of Systems  Constitutional: Positive for fatigue. Negative for fever.  Gastrointestinal: Positive for abdominal pain, nausea and vomiting. Negative for blood in stool and diarrhea.       Past Medical History:  Diagnosis Date  . Acute pancreatitis without infection or necrosis 04/21/2019  . Anemia   . Anxiety 01/28/2013   no meds  . Depression    no meds  . IBS (irritable bowel syndrome)   . Migraines    HA every other day - otc med prn  . Recurrent streptococcal tonsillitis   . Seasonal allergies    Seasonal environmental allergies  . Sebaceous cyst 08/04/2018   L mid back   .  Vision abnormalities    Myopia; patient has history of devloping ulcers when she wore contacts.     Social History   Socioeconomic History  . Marital status: Single    Spouse name: Not on file  . Number of children: 0  . Years of education: Not on file  . Highest education level: Not on file  Occupational History  . Occupation: Nanny  Tobacco Use  . Smoking status: Never Smoker  . Smokeless tobacco: Never Used  Vaping Use  . Vaping Use:  Never used  Substance and Sexual Activity  . Alcohol use: Yes    Comment: socially  . Drug use: Yes    Types: Marijuana    Comment: Last use Thursday 10/16/18  . Sexual activity: Not Currently    Partners: Male    Birth control/protection: Implant    Comment: broke up with first sexual partner 1 month ago  Other Topics Concern  . Not on file  Social History Narrative   Single.    Student at [employer].   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    Enjoys listening to music, hanging out with friends.    Social Determinants of Health   Financial Resource Strain:   . Difficulty of Paying Living Expenses: Not on file  Food Insecurity:   . Worried About Programme researcher, broadcasting/film/video in the Last Year: Not on file  . Ran Out of Food in the Last Year: Not on file  Transportation Needs:   . Lack of Transportation (Medical): Not on file  . Lack of Transportation (Non-Medical): Not on file  Physical Activity:   . Days of Exercise per Week: Not on file  . Minutes of Exercise per Session: Not on file  Stress:   . Feeling of Stress : Not on file  Social Connections:   . Frequency of Communication with Friends and Family: Not on file  . Frequency of Social Gatherings with Friends and Family: Not on file  . Attends Religious Services: Not on file  . Active Member of Clubs or Organizations: Not on file  . Attends Banker Meetings: Not on file  . Marital Status: Not on file  Intimate Partner Violence:   . Fear of Current or Ex-Partner: Not on file  . Emotionally Abused: Not on file  . Physically Abused: Not on file  . Sexually Abused: Not on file    Past Surgical History:  Procedure Laterality Date  . COLONOSCOPY     IBS  . COLONOSCOPY     IBS  . LAPAROSCOPY N/A 10/20/2018   Procedure: LAPAROSCOPY DIAGNOSTIC possible removal of endometriosis;  Surgeon: Mitchel Honour, DO;  Location: [facility];  Service: Gynecology;  Laterality: N/A;  . UPPER GASTROINTESTINAL ENDOSCOPY   03/04/2020    Family History  Problem Relation Age of Onset  . Depression Mother   . Migraines Mother   . Irritable bowel syndrome Mother   . Hypertension Mother   . Migraines Father   . Diabetes Father   . Diabetes Maternal Grandmother   . Hypertension Maternal Grandmother   . Diabetes Sister   . Stomach cancer Sister 28  . Colon cancer Neg Hx   . Esophageal cancer Neg Hx   . Rectal cancer Neg Hx     Allergies  Allergen Reactions  . Coconut Flavor Other (See Comments)    Mouth itches, but no impaired breathing (per patient  . Septra [Sulfamethoxazole-Trimethoprim] Nausea Only and Other (See Comments)  Patient took a few courses of this and it ended up being stopped by a MD because it had started causing "bad stomach pain" (per the patient)    Current Outpatient Medications on File Prior to Visit  Medication Sig Dispense Refill  . etonogestrel (NEXPLANON) 68 MG IMPL implant 1 each by Subdermal route once.    . naproxen (NAPROSYN) 375 MG tablet Take 1 tablet (375 mg total) by mouth 3 (three) times daily with meals. 30 tablet 0  . ondansetron (ZOFRAN ODT) 4 MG disintegrating tablet Take 1-2 tablets (4-8 mg total) by mouth every 8 (eight) hours as needed for nausea or vomiting. 40 tablet 0  . [DISCONTINUED] dicyclomine (BENTYL) 20 MG tablet Take 1 tablet (20 mg total) by mouth 2 (two) times daily. 20 tablet 0  . [DISCONTINUED] famotidine (PEPCID) 20 MG tablet Take 1 tablet (20 mg total) by mouth 2 (two) times daily. 30 tablet 0  . [DISCONTINUED] metoCLOPramide (REGLAN) 10 MG tablet Take 1 tablet (10 mg total) by mouth every 6 (six) hours as needed for nausea or vomiting. (Patient not taking: Reported on 09/19/2019) 30 tablet 0  . [DISCONTINUED] pantoprazole (PROTONIX) 40 MG tablet Take 40 mg twice a day for 8 weeks 120 tablet 1  . [DISCONTINUED] promethazine (PHENERGAN) 25 MG suppository Place 1 suppository (25 mg total) rectally every 6 (six) hours as needed for nausea or  vomiting. (Patient not taking: Reported on 03/04/2020) 12 each 0  . [DISCONTINUED] sucralfate (CARAFATE) 1 GM/10ML suspension Take 10 mLs (1 g total) by mouth 4 (four) times daily -  with meals and at bedtime. 420 mL 0   Current Facility-Administered Medications on File Prior to Visit  Medication Dose Route Frequency Provider Last Rate Last Admin  . 0.9 %  sodium chloride infusion  500 mL Intravenous Once Cirigliano, Vito V, DO        Ht 5' 2.75" (1.594 m)   Wt 115 lb (52.2 kg)   BMI 20.53 kg/m    Objective:   Physical Exam Constitutional:      Appearance: She is not ill-appearing.  Pulmonary:     Effort: Pulmonary effort is normal.  Neurological:     Mental Status: She is alert and oriented to person, place, and time.  Psychiatric:        Mood and Affect: Mood normal.            Assessment & Plan:

## 2020-08-30 NOTE — Patient Instructions (Signed)
Call GI to follow up regarding your constant nausea with vomiting.  Use the Zofran sparingly, this is not meant to be used daily.  Use the naproxen as needed for menses.   It was a pleasure to see you today! Mayra Reel, NP-C

## 2020-08-30 NOTE — Assessment & Plan Note (Signed)
Ongoing, chronic for nearly one year.  I strongly advised that she follow back up with GI as we cannot continue to provide Zofran for frequent use. She understands and will set up an appointment.  Refill provided.

## 2020-08-30 NOTE — Assessment & Plan Note (Signed)
Doing well with PRN naproxen. Refill  Provided.

## 2020-09-13 ENCOUNTER — Emergency Department (HOSPITAL_COMMUNITY)
Admission: EM | Admit: 2020-09-13 | Discharge: 2020-09-13 | Disposition: A | Discharge status: Home / Self Care | Payer: No Typology Code available for payment source | Attending: Emergency Medicine | Admitting: Emergency Medicine

## 2020-09-13 ENCOUNTER — Encounter (HOSPITAL_COMMUNITY): Payer: Self-pay

## 2020-09-13 ENCOUNTER — Other Ambulatory Visit: Payer: Self-pay

## 2020-09-13 ENCOUNTER — Telehealth: Payer: Self-pay | Admitting: *Deleted

## 2020-09-13 DIAGNOSIS — R079 Chest pain, unspecified: Secondary | ICD-10-CM | POA: Diagnosis not present

## 2020-09-13 DIAGNOSIS — Z8669 Personal history of other diseases of the nervous system and sense organs: Secondary | ICD-10-CM | POA: Insufficient documentation

## 2020-09-13 DIAGNOSIS — R0602 Shortness of breath: Secondary | ICD-10-CM | POA: Insufficient documentation

## 2020-09-13 DIAGNOSIS — R519 Headache, unspecified: Secondary | ICD-10-CM | POA: Insufficient documentation

## 2020-09-13 DIAGNOSIS — R112 Nausea with vomiting, unspecified: Secondary | ICD-10-CM | POA: Diagnosis present

## 2020-09-13 DIAGNOSIS — N946 Dysmenorrhea, unspecified: Secondary | ICD-10-CM | POA: Diagnosis not present

## 2020-09-13 LAB — CBC
HCT: 43.2 % (ref 36.0–46.0)
Hemoglobin: 14 g/dL (ref 12.0–15.0)
MCH: 29.1 pg (ref 26.0–34.0)
MCHC: 32.4 g/dL (ref 30.0–36.0)
MCV: 89.8 fL (ref 80.0–100.0)
Platelets: 255 10*3/uL (ref 150–400)
RBC: 4.81 MIL/uL (ref 3.87–5.11)
RDW: 13.2 % (ref 11.5–15.5)
WBC: 5.6 10*3/uL (ref 4.0–10.5)
nRBC: 0 % (ref 0.0–0.2)

## 2020-09-13 LAB — COMPREHENSIVE METABOLIC PANEL
ALT: 20 U/L (ref 0–44)
AST: 27 U/L (ref 15–41)
Albumin: 4.5 g/dL (ref 3.5–5.0)
Alkaline Phosphatase: 35 U/L — ABNORMAL LOW (ref 38–126)
Anion gap: 14 (ref 5–15)
BUN: 10 mg/dL (ref 6–20)
CO2: 19 mmol/L — ABNORMAL LOW (ref 22–32)
Calcium: 9.7 mg/dL (ref 8.9–10.3)
Chloride: 105 mmol/L (ref 98–111)
Creatinine, Ser: 0.86 mg/dL (ref 0.44–1.00)
GFR, Estimated: >60 mL/min (ref >60)
Glucose, Bld: 77 mg/dL (ref 70–99)
Potassium: 3.8 mmol/L (ref 3.5–5.1)
Sodium: 138 mmol/L (ref 135–145)
Total Bilirubin: 1.1 mg/dL (ref 0.3–1.2)
Total Protein: 7.9 g/dL (ref 6.5–8.1)

## 2020-09-13 LAB — LIPASE, BLOOD: Lipase: 30 U/L (ref 11–51)

## 2020-09-13 LAB — CBG MONITORING, ED: Glucose-Capillary: 80 mg/dL (ref 70–99)

## 2020-09-13 LAB — I-STAT BETA HCG BLOOD, ED (MC, WL, AP ONLY): I-stat hCG, quantitative: <5 m[IU]/mL (ref <5)

## 2020-09-13 MED ORDER — SODIUM CHLORIDE 0.9 % IV BOLUS
1000.0000 mL | Freq: Once | INTRAVENOUS | Status: AC
  Administered 2020-09-13: 1000 mL via INTRAVENOUS

## 2020-09-13 MED ORDER — ONDANSETRON HCL 4 MG/2ML IJ SOLN
4.0000 mg | Freq: Once | INTRAMUSCULAR | Status: AC
  Administered 2020-09-13: 4 mg via INTRAVENOUS
  Filled 2020-09-13: qty 2

## 2020-09-13 MED ORDER — KETOROLAC TROMETHAMINE 15 MG/ML IJ SOLN
15.0000 mg | Freq: Once | INTRAMUSCULAR | Status: AC
  Administered 2020-09-13: 15 mg via INTRAVENOUS
  Filled 2020-09-13: qty 1

## 2020-09-13 MED FILL — ONDANSETRON ODT 4 MG TABLET: 4 | 5 days supply | Qty: 30 | Fill #0

## 2020-09-13 MED FILL — NAPROXEN 375 MG TABLET: 375 | 30 days supply | Qty: 60 | Fill #0

## 2020-09-13 NOTE — Telephone Encounter (Signed)
Noted, will be looking out for hospital notes.

## 2020-09-13 NOTE — ED Provider Notes (Signed)
[facility] EMERGENCY DEPARTMENT Provider Note   CSN: [id number] Arrival date & time: 09/13/20  1511     History Chief Complaint  Patient presents with  . Emesis  . Headache    Stephanie [name] is a 24 y.o. female.  Stephanie [name] is a 24y.o. AAF with a significant PMHx of recent H. Pylori infection in April, migraine headaches, and IBS who presents to the ED today with the chief complaints of nausea, vomiting, and headache. She states that symptoms started 2 days ago when she began her menstrual cycle and endorses symptoms like this during previous cycles since December 2019 (onset after exploratory laparoscopy). She states that she had several episodes of emesis this morning, without hematochezia and that her vomitus was green to yellow in color. She has bene unable to eat much and endorses significant anxiety around vomiting and states that she feels as if her vomit "gets stuck" in her chest before coming up. She also states that she had a headache when going to bed last night that has persisted today. She rates her current pain at an 8/10 in severity. She has tried naproxen in the past with relief however states that she ran out through the weekend and the pharmacy was not available/open for refill. She also endorses shortness of breath, chest pain, dizziness, and vision changes during her episodes of emesis this morning. She states she is not sure if she passed out this morning, states that she was laying on the floor with an emesis basin, later woke up laying on the floor but does not remember going to sleep on the floor. She denies any diarrhea and states that her last bowel movement was a couple days ago.  Patient is tearful, frustrated with her state of health, states that she does not feel well daily at baseline and has these episodes of nausea and vomiting with her menstrual cycles.  Patient's PCP last filled her Zofran on October 12, deferred future refills  to GI.        Past Medical History:  Diagnosis Date  . Acute pancreatitis without infection or necrosis 04/21/2019  . Anemia   . Anxiety 01/28/2013   no meds  . Depression    no meds  . IBS (irritable bowel syndrome)   . Migraines    HA every other day - otc med prn  . Recurrent streptococcal tonsillitis   . Seasonal allergies    Seasonal environmental allergies  . Sebaceous cyst 08/04/2018   L mid back   . Vision abnormalities    Myopia; patient has history of devloping ulcers when she wore contacts.    Patient Active Problem List   Diagnosis Date Noted  . Dysmenorrhea 08/30/2020  . Flank pain 07/10/2019  . Near syncope 06/05/2019  . Nausea and vomiting 04/20/2019  . Axillary hidradenitis suppurativa 03/05/2019  . Recurrent infection of skin 02/04/2019  . Preventative health care 11/20/2017  . Menorrhagia with regular cycle 09/24/2014  . Migraines 08/04/2014  . Allergic rhinitis 04/03/2013  . MDD (major depressive disorder), single episode, severe (HCC) 01/28/2013    Class: Chronic  . Generalized anxiety disorder 01/28/2013    Past Surgical History:  Procedure Laterality Date  . COLONOSCOPY     IBS  . COLONOSCOPY     IBS  . LAPAROSCOPY N/A 10/20/2018   Procedure: LAPAROSCOPY DIAGNOSTIC possible removal of endometriosis;  Surgeon: Mitchel Honour, DO;  Location: [facility];  Service: Gynecology;  Laterality: N/A;  . UPPER GASTROINTESTINAL  ENDOSCOPY  03/04/2020     OB History    Gravida  0   Para  0   Term  0   Preterm  0   AB  0   Living  0     SAB  0   TAB  0   Ectopic  0   Multiple  0   Live Births  0        Obstetric Comments  Menstrual age: 57  Age 1st Pregnancy: N/A         Family History  Problem Relation Age of Onset  . Depression Mother   . Migraines Mother   . Irritable bowel syndrome Mother   . Hypertension Mother   . Migraines Father   . Diabetes Father   . Diabetes Maternal Grandmother   . Hypertension Maternal  Grandmother   . Diabetes Sister   . Stomach cancer Sister 20  . Colon cancer Neg Hx   . Esophageal cancer Neg Hx   . Rectal cancer Neg Hx     Social History   Tobacco Use  . Smoking status: Never Smoker  . Smokeless tobacco: Never Used  Vaping Use  . Vaping Use: Never used  Substance Use Topics  . Alcohol use: Yes    Comment: socially  . Drug use: Yes    Types: Marijuana    Comment: Last use Thursday 10/16/18    Home Medications Prior to Admission medications   Medication Sig Start Date End Date Taking? Authorizing Provider  etonogestrel (NEXPLANON) 68 MG IMPL implant 1 each by Subdermal route once. 09/30/18   [provider]  naproxen (NAPROSYN) 375 MG tablet Take 1 tablet (375 mg total) by mouth 2 (two) times daily with a meal. As needed for menstrual cramping. 08/30/20   Doreene Nest, NP  ondansetron (ZOFRAN ODT) 4 MG disintegrating tablet Take 1-2 tablets (4-8 mg total) by mouth every 8 (eight) hours as needed for nausea or vomiting. 08/30/20   Doreene Nest, NP  dicyclomine (BENTYL) 20 MG tablet Take 1 tablet (20 mg total) by mouth 2 (two) times daily. 10/26/19 05/17/20  Joy, Shawn C, PA-C  famotidine (PEPCID) 20 MG tablet Take 1 tablet (20 mg total) by mouth 2 (two) times daily. 02/07/20 05/17/20  Henderly, Britni A, PA-C  metoCLOPramide (REGLAN) 10 MG tablet Take 1 tablet (10 mg total) by mouth every 6 (six) hours as needed for nausea or vomiting. Patient not taking: Reported on 09/19/2019 06/19/19 09/19/19  Caccavale, Sophia, PA-C  pantoprazole (PROTONIX) 40 MG tablet Take 40 mg twice a day for 8 weeks 03/04/20 05/17/20  Cirigliano, Verlin Dike, DO  promethazine (PHENERGAN) 25 MG suppository Place 1 suppository (25 mg total) rectally every 6 (six) hours as needed for nausea or vomiting. Patient not taking: Reported on 03/04/2020 02/07/20 05/17/20  Henderly, Britni A, PA-C  sucralfate (CARAFATE) 1 GM/10ML suspension Take 10 mLs (1 g total) by mouth 4 (four) times daily  -  with meals and at bedtime. 02/07/20 05/17/20  Henderly, Britni A, PA-C    Allergies    Coconut flavor and Septra [sulfamethoxazole-trimethoprim]  Review of Systems   Review of Systems  Constitutional: Negative for fever.  Respiratory: Negative for shortness of breath.   Cardiovascular: Negative for chest pain.  Gastrointestinal: Positive for abdominal pain, nausea and vomiting. Negative for constipation and diarrhea.  Genitourinary: Positive for vaginal bleeding.  Musculoskeletal: Negative for arthralgias and myalgias.  Skin: Negative for rash and wound.  Allergic/Immunologic: Negative  for immunocompromised state.  Neurological: Negative for weakness.  Psychiatric/Behavioral: Negative for confusion.  All other systems reviewed and are negative.   Physical Exam Updated Vital Signs BP 120/65 (BP Location: Right Arm)   Pulse 99   Temp 98.5 F (36.9 C) (Oral)   Resp 20   Ht 5\' 3"  (1.6 m)   LMP 09/11/2020   SpO2 100%   BMI 20.37 kg/m   Physical Exam Vitals and nursing note reviewed.  Constitutional:      General: She is not in acute distress.    Appearance: She is well-developed. She is not diaphoretic.  HENT:     Head: Normocephalic and atraumatic.     Mouth/Throat:     Mouth: Mucous membranes are moist.  Eyes:     Extraocular Movements: Extraocular movements intact.     Pupils: Pupils are equal, round, and reactive to light.  Cardiovascular:     Rate and Rhythm: Normal rate and regular rhythm.     Heart sounds: Normal heart sounds. No murmur heard.   Pulmonary:     Effort: Pulmonary effort is normal.     Breath sounds: Normal breath sounds.  Abdominal:     Palpations: Abdomen is soft.     Tenderness: There is abdominal tenderness in the right lower quadrant, suprapubic area and left lower quadrant. There is no right CVA tenderness or left CVA tenderness.  Musculoskeletal:     Right lower leg: No edema.     Left lower leg: No edema.  Skin:    General: Skin is  warm and dry.     Findings: No erythema or rash.  Neurological:     Mental Status: She is alert and oriented to person, place, and time.  Psychiatric:        Behavior: Behavior normal.     ED Results / Procedures / Treatments   Labs (all labs ordered are listed, but only abnormal results are displayed) Labs Reviewed  COMPREHENSIVE METABOLIC PANEL - Abnormal; Notable for the following components:      Result Value   CO2 19 (*)    Alkaline Phosphatase 35 (*)    All other components within normal limits  LIPASE, BLOOD  CBC  URINALYSIS, ROUTINE W REFLEX MICROSCOPIC  I-STAT BETA HCG BLOOD, ED ([facility], [facility], [facility] ONLY)  CBG MONITORING, ED    EKG None  Radiology No results found.  Procedures Procedures (including critical care time)  Medications Ordered in ED Medications  ondansetron (ZOFRAN) injection 4 mg (4 mg Intravenous Given 09/13/20 1723)  sodium chloride 0.9 % bolus 1,000 mL (1,000 mLs Intravenous New Bag/Given (Non-Interop) 09/13/20 1731)  ketorolac (TORADOL) 15 MG/ML injection 15 mg (15 mg Intravenous Given 09/13/20 1722)    ED Course  I have reviewed the triage vital signs and the nursing notes.  Pertinent labs & imaging results that were available during my care of the patient were reviewed by me and considered in my medical decision making (see chart for details).  Clinical Course as of Sep 13 1857  Tue Sep 13, 2020  [id number] 24 year old female presents with complaint of nausea and vomiting with pelvic cramps.  Episodes similar to prior events that she suffers with monthly with her menstrual cycle. Labs are reassuring including CBC, CMP, lipase and negative hCG. As per EKG EDD patient's uncertainty about syncopal episode although doubt true syncope. Patient was given Toradol and Zofran, report significant improvement in pain, no longer nauseous. Prescription for Zofran sent to patient's pharmacy.  Patient  will pick up her naproxen although advised this is Aleve if she  cannot get her prescription she could take over-the-counter.   [LM]    Clinical Course User Index [LM] Alden Hipp   MDM Rules/Calculators/A&P                          Final Clinical Impression(s) / ED Diagnoses Final diagnoses:  Non-intractable vomiting with nausea, unspecified vomiting type  Dysmenorrhea    Rx / DC Orders ED Discharge Orders    None       Jeannie Fend, PA-C 09/13/20 Rondel Baton, MD 09/15/20 2201

## 2020-09-13 NOTE — Discharge Instructions (Addendum)
Take your Naproxen as discussed. Zofran ODT as needed as prescribed for nausea and vomiting. Follow up with GI and GYN as discussed.    Consider these options for your care in the future:   Walk-ins for certain complaints available at:   [facility] [address]  [phone]  See hours at [email or web address]   [facility] [employer] [facility] [employer] [facility]  [address]  [phone]   [facility] [employer] [facility] [employer]  [phone]   You can make an appointment to see a GYN provider:   [facility] at [facility]  [address]  [phone]   [facility] for [facility] at [facility]  [address] [employer]  [phone]   [facility] for [facility] at [facility]  [phone]   [facility] for [facility] at [facility]  [address], [address]  [phone]   If you already have an established OB/GYN provider in the area you can make an appointment with them as well.

## 2020-09-13 NOTE — ED Triage Notes (Signed)
Pt has multiple symptoms: States she just started her menstrual cycle 2 days ago and is having severe abd pain, emesis, dizziness, blurred vision and had a near syncopal episode this morning.

## 2020-09-13 NOTE — ED Notes (Signed)
Discharge instructions reviewed with patient and patient verbalized understanding. Patient vss and nad. Patient ambulatory for discharge.

## 2020-09-13 NOTE — Telephone Encounter (Signed)
Patient left a voicemail requesting a call back.  Called patient back and was advised that she is at [facility] ER getting checked in now. Patient stated that she is on her cycle and has been vomiting a lot and having a lot of pain. Patient stated that she has felt dizzy and having some SOB, Advised patient she is probably where she needs to be and this message will go back to Mayra Reel NP for her to know that she is at the ER.

## 2020-10-11 ENCOUNTER — Encounter: Payer: Self-pay | Admitting: Obstetrics & Gynecology

## 2020-10-11 ENCOUNTER — Other Ambulatory Visit: Payer: Self-pay | Admitting: Obstetrics & Gynecology

## 2020-10-11 ENCOUNTER — Ambulatory Visit (INDEPENDENT_AMBULATORY_CARE_PROVIDER_SITE_OTHER): Payer: No Typology Code available for payment source | Admitting: Obstetrics & Gynecology

## 2020-10-11 ENCOUNTER — Encounter: Payer: Self-pay | Admitting: Radiology

## 2020-10-11 ENCOUNTER — Other Ambulatory Visit: Payer: Self-pay

## 2020-10-11 VITALS — BP 97/69 | HR 106 | Ht 62.5 in | Wt 126.0 lb

## 2020-10-11 DIAGNOSIS — R112 Nausea with vomiting, unspecified: Secondary | ICD-10-CM

## 2020-10-11 DIAGNOSIS — N920 Excessive and frequent menstruation with regular cycle: Secondary | ICD-10-CM | POA: Diagnosis not present

## 2020-10-11 MED ORDER — SCOPOLAMINE 1 MG/3DAYS TD PT72: 1.0000 | MEDICATED_PATCH | TRANSDERMAL | 12 refills | Status: DC

## 2020-10-11 MED ORDER — TRANEXAMIC ACID 650 MG PO TABS: 1300.0000 mg | ORAL_TABLET | Freq: Three times a day (TID) | ORAL | 2 refills | Status: DC

## 2020-10-11 MED FILL — TRANEXAMIC ACID 650 MG TAB: 650 | 5 days supply | Qty: 30 | Fill #0

## 2020-10-11 MED FILL — SCOPOLAMINE 1 MG/3DAYS PT72: 1 | 12 days supply | Qty: 4 | Fill #0

## 2020-10-11 NOTE — Progress Notes (Signed)
GYNECOLOGY OFFICE VISIT NOTE  History:   Stephanie [name] is a 24 y.o. G0 here today for discussion about management of severe nausea and cramping during her menstrual periods.  Has been on Nexplanon for past 5 years, recent one has been in for 2 years. Has long history of chronic abdominal pain also with nausea and vomiting and had extensive GI evaluation, but worsens during her periods. She has tried OCPs in addition to the Nexplanon in the past, but this made her symptoms worse. Does not want more hormones.  Nausea not alleviated by Zofran or other oral antiemetics.  Has been using Naproxen for pain, helps some what. Periods last for five days with heavy bleeding.  Nausea and vomiting start about one day before and lasts throughout. She denies any current abnormal vaginal discharge, bleeding, pelvic pain or other concerns.    Past Medical History:  Diagnosis Date  . Acute pancreatitis without infection or necrosis 04/21/2019  . Anemia   . Anxiety 01/28/2013   no meds  . Depression    no meds  . IBS (irritable bowel syndrome)   . Migraines    HA every other day - otc med prn  . Recurrent streptococcal tonsillitis   . Seasonal allergies    Seasonal environmental allergies  . Sebaceous cyst 08/04/2018   L mid back   . Vision abnormalities    Myopia; patient has history of devloping ulcers when she wore contacts.    Past Surgical History:  Procedure Laterality Date  . COLONOSCOPY     IBS  . COLONOSCOPY     IBS  . LAPAROSCOPY N/A 10/20/2018   Procedure: LAPAROSCOPY DIAGNOSTIC possible removal of endometriosis;  Surgeon: Mitchel Honour, DO;  Location: [facility];  Service: Gynecology;  Laterality: N/A;  . UPPER GASTROINTESTINAL ENDOSCOPY  03/04/2020    The following portions of the patient's history were reviewed and updated as appropriate: allergies, current medications, past family history, past medical history, past social history, past surgical history and problem list.   Health  Maintenance:  Normal pap on 02/11/2018.  Review of Systems:  Pertinent items noted in HPI and remainder of comprehensive ROS otherwise negative.  Physical Exam:  BP 97/69   Pulse (!) 106   Ht 5' 2.5" (1.588 m)   Wt 126 lb (57.2 kg)   LMP 09/11/2020   BMI 22.68 kg/m  CONSTITUTIONAL: Well-developed, well-nourished female in no acute distress.  HEENT:  Normocephalic, atraumatic. External right and left ear normal. No scleral icterus.  NECK: Normal range of motion, supple, no masses noted on observation SKIN: No rash noted. Not diaphoretic. No erythema. No pallor. MUSCULOSKELETAL: Normal range of motion. No edema noted. NEUROLOGIC: Alert and oriented to person, place, and time. Normal muscle tone coordination. No cranial nerve deficit noted. PSYCHIATRIC: Normal mood and affect. Normal behavior. Normal judgment and thought content. CARDIOVASCULAR: Normal heart rate noted RESPIRATORY: Effort and breath sounds normal, no problems with respiration noted ABDOMEN: No masses noted. No other overt distention noted.   PELVIC: Deferred     Assessment and Plan:      1. Severe nausea and vomiting Recommended Scopolamine patch for her severe nausea and vomiting. Can use Zofran as needed also.  Recommended that she start treatment about 1-2 days prior to menstrual periods.  - scopolamine (TRANSDERM-SCOP, 1.5 MG,) 1 MG/3DAYS; Place 1 patch (1.5 mg total) onto the skin every 3 (three) days.  Dispense: 4 patch; Refill: 12  2. Menorrhagia with regular cycle Recommended trial  of Lysteda to help with heavy bleeding. Will evaluate response. Follow up in 2 months. - tranexamic acid (LYSTEDA) 650 MG TABS tablet; Take 2 tablets (1,300 mg total) by mouth 3 (three) times daily. Take during menses for a maximum of five days  Dispense: 30 tablet; Refill: 2 Routine preventative health maintenance measures emphasized. Please refer to After Visit Summary for other counseling recommendations.   Return in about 1  week (around 10/18/2020) for Flu vaccine  Annual exam and follow up 11/2020.    I spent 15 minutes dedicated to the care of this patient including pre-visit review of records, face to face time with the patient discussing her conditions and treatments and post visit ordering of testing.    Jaynie Collins, MD, FACOG Obstetrician & Gynecologist, [facility] for [employer], [facility]

## 2020-10-17 ENCOUNTER — Ambulatory Visit: Payer: No Typology Code available for payment source

## 2020-10-18 MED FILL — TRANEXAMIC ACID 650 MG TABS: 650 | 5 days supply | Qty: 30 | Fill #0

## 2020-10-18 MED FILL — SCOPOLAMINE 1 MG/3 DAY PATC: 1 | 12 days supply | Qty: 4 | Fill #0

## 2020-10-20 ENCOUNTER — Ambulatory Visit: Payer: No Typology Code available for payment source

## 2020-11-14 ENCOUNTER — Telehealth: Payer: Self-pay

## 2020-11-14 ENCOUNTER — Encounter: Payer: No Typology Code available for payment source | Admitting: Obstetrics & Gynecology

## 2020-11-14 NOTE — Telephone Encounter (Signed)
[facility] Night - Client TELEPHONE ADVICE RECORD AccessNurse Patient Name: [facility] Gender: Female DOB: 1996/06/12 Age: 24 Y 11 M 22 D Return Phone Number: [phone] (Primary), [phone] (Secondary) Address[place] [address] Client [facility] Night - Client Client Site [facility] [place] - Night Physician Vernona Rieger - NP Contact Type Call Who Is Calling Patient / Member / Family / Caregiver Call Type Triage / Clinical Relationship To Patient Self Return Phone Number [phone] (Primary) Chief Complaint Headache Reason for Call Symptomatic / Request for Health Information Initial Comment Caller states she has not been feeling well,she wants to get tested for flu. Caller states her covid was negative. Caller states migraine behind her eyes, body aches, chills. Translation No Nurse Assessment Nurse: Suezanne Jacquet, RN, Riley Lam Date/Time (Eastern Time): 11/14/2020 6:52:50 AM Confirm and document reason for call. If symptomatic, describe symptoms. ---Caller states she has not been feeling well,she wants to get tested for flu. Caller states her covid was negative. Caller states migraine behind her eyes, body aches, chills. Started 2 nights ago and woke up drenched in sweat and nausea. Wants flu tested. Tested for covid before symptoms began 3 days ago due to boyfriend illness. Does the patient have any new or worsening symptoms? ---Yes Will a triage be completed? ---Yes Related visit to physician within the last 2 weeks? ---No Does the PT have any chronic conditions? (i.e. diabetes, asthma, this includes High risk factors for pregnancy, etc.) ---Yes List chronic conditions. ---IBS Is the patient pregnant or possibly pregnant? (Ask all females between the ages of 36-55) ---No Is this a behavioral health or substance abuse call? ---No Guidelines Guideline Title Affirmed  Question Affirmed Notes Nurse Date/Time (Eastern Time) COVID-19 - Diagnosed or Suspected [1] COVID-19 infection suspected by caller or triager AND [2] mild symptoms (cough, fever, or others) AND [3] Suezanne Jacquet, RN, Riley Lam 11/14/2020 6:54:48 AM PLEASE NOTE: All timestamps contained within this report are represented as [place] Standard Time. CONFIDENTIALTY NOTICE: This fax transmission is intended only for the addressee. It contains information that is legally privileged, confidential or otherwise protected from use or disclosure. If you are not the intended recipient, you are strictly prohibited from reviewing, disclosing, copying using or disseminating any of this information or taking any action in reliance on or regarding this information. If you have received this fax in error, please notify us immediately by telephone so that we can arrange for its return to [place]. Phone: [phone], Toll-Free: [phone], Fax: [phone] Page: 2 of 2 Call Id: [id number] Guidelines Guideline Title Affirmed Question Affirmed Notes Nurse Date/Time Lamount Cohen Time) negative COVID-19 rapid test Disp. Time Lamount Cohen Time) Disposition Final User 11/14/2020 7:00:46 AM Call PCP when Office is Open Yes Suezanne Jacquet, RN, [place] Spaniel Disagree/Comply Comply Caller Understands Yes PreDisposition Did not know what to do Care Advice Given Per Guideline CALL PCP WHEN OFFICE IS OPEN: CALL BACK IF: * Fever over 103 F (39.4 C) * Chest pain or difficulty breathing occurs * You become worse CARE ADVICE given per COVID-19 - DIAGNOSED OR SUSPECTED (Adult) guideline. Referrals REFERRED TO PCP OFFICE

## 2020-11-14 NOTE — Telephone Encounter (Signed)
Left v/m requesting cb. 

## 2020-11-14 NOTE — Telephone Encounter (Signed)
I was unable to speak with pt; left v/m requesting pt to cb to [facility].

## 2020-11-14 NOTE — Telephone Encounter (Signed)
Patient returned your call. Please call the patient back. EM

## 2020-11-24 NOTE — Telephone Encounter (Signed)
Pt calling; pt said that she was retested for covid and was neg. Pt is feeling much better; no symptoms except slight cough. Pt is out of town now and will cb next wk if feels like needs to have virtual or any testing. FYI to Allayne Gitelman NP.

## 2020-11-24 NOTE — Telephone Encounter (Signed)
Noted and appreciate the follow up. 

## 2020-11-29 ENCOUNTER — Ambulatory Visit: Payer: No Typology Code available for payment source | Admitting: Obstetrics & Gynecology

## 2020-12-21 ENCOUNTER — Ambulatory Visit: Payer: No Typology Code available for payment source | Admitting: Primary Care

## 2020-12-22 ENCOUNTER — Ambulatory Visit (INDEPENDENT_AMBULATORY_CARE_PROVIDER_SITE_OTHER): Payer: No Typology Code available for payment source | Admitting: Primary Care

## 2020-12-22 ENCOUNTER — Encounter: Payer: Self-pay | Admitting: Primary Care

## 2020-12-22 ENCOUNTER — Other Ambulatory Visit: Payer: Self-pay

## 2020-12-22 VITALS — BP 116/80 | HR 90 | Temp 97.8°F | Ht 63.0 in | Wt 132.0 lb

## 2020-12-22 DIAGNOSIS — N63 Unspecified lump in unspecified breast: Secondary | ICD-10-CM | POA: Diagnosis not present

## 2020-12-22 NOTE — Assessment & Plan Note (Signed)
Noted on exam, non tender. Appears cyst like. She does have densities throughout both breasts.  Orders placed for bilateral breast ultrasounds.

## 2020-12-22 NOTE — Progress Notes (Signed)
Subjective:    Patient ID: Stephanie [name], female    DOB: 08/16/1996, 25 y.o.   MRN: [id number]  HPI  This visit occurred during the SARS-CoV-2 public health emergency.  Safety protocols were in place, including screening questions prior to the visit, additional usage of staff PPE, and extensive cleaning of exam room while observing appropriate contact time as indicated for disinfecting solutions.   Stephanie [name] is a 25 year old female with a history of dysmenorrhea, recurrent skin infections, menorrhagia who presents today with a chief complaint of breast lump.  The lump is located to the right upper breast for which she noticed about 10 days ago. She's been monitoring the site since, did notice tenderness about one week ago.   She denies a family history of breast cancer, erythema, redness, skin texture changes. She is on her menstrual cycle now.   Review of Systems  Constitutional: Negative for fever.  Genitourinary:       Right upper breast mass with tenderness  Skin: Negative for color change, rash and wound.       Past Medical History:  Diagnosis Date  . Acute pancreatitis without infection or necrosis 04/21/2019  . Anemia   . Anxiety 01/28/2013   no meds  . Depression    no meds  . IBS (irritable bowel syndrome)   . Migraines    HA every other day - otc med prn  . Recurrent streptococcal tonsillitis   . Seasonal allergies    Seasonal environmental allergies  . Sebaceous cyst 08/04/2018   L mid back   . Vision abnormalities    Myopia; patient has history of devloping ulcers when she wore contacts.     Social History   Socioeconomic History  . Marital status: Single    Spouse name: Not on file  . Number of children: 0  . Years of education: Not on file  . Highest education level: Not on file  Occupational History  . Occupation: Nanny  Tobacco Use  . Smoking status: Never Smoker  . Smokeless tobacco: Never Used  Vaping Use  . Vaping Use: Never used   Substance and Sexual Activity  . Alcohol use: Yes    Comment: socially  . Drug use: Yes    Types: Marijuana    Comment: Last use Thursday 10/16/18  . Sexual activity: Not Currently    Partners: Male    Birth control/protection: Implant    Comment: broke up with first sexual partner 1 month ago  Other Topics Concern  . Not on file  Social History Narrative   Single.    Student at [employer].   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    Enjoys listening to music, hanging out with friends.    Social Determinants of Health   Financial Resource Strain: Not on file  Food Insecurity: Not on file  Transportation Needs: Not on file  Physical Activity: Not on file  Stress: Not on file  Social Connections: Not on file  Intimate Partner Violence: Not on file    Past Surgical History:  Procedure Laterality Date  . COLONOSCOPY     IBS  . COLONOSCOPY     IBS  . LAPAROSCOPY N/A 10/20/2018   Procedure: LAPAROSCOPY DIAGNOSTIC possible removal of endometriosis;  Surgeon: Mitchel Honour, DO;  Location: [facility];  Service: Gynecology;  Laterality: N/A;  . UPPER GASTROINTESTINAL ENDOSCOPY  03/04/2020    Family History  Problem Relation Age of Onset  . Depression Mother   .  Migraines Mother   . Irritable bowel syndrome Mother   . Hypertension Mother   . Migraines Father   . Diabetes Father   . Diabetes Maternal Grandmother   . Hypertension Maternal Grandmother   . Diabetes Sister   . Stomach cancer Sister 37  . Colon cancer Neg Hx   . Esophageal cancer Neg Hx   . Rectal cancer Neg Hx     Allergies  Allergen Reactions  . Coconut Flavor Other (See Comments)    Mouth itches, but no impaired breathing (per patient  . Septra [Sulfamethoxazole-Trimethoprim] Nausea Only and Other (See Comments)    Patient took a few courses of this and it ended up being stopped by a MD because it had started causing "bad stomach pain" (per the patient)    Current Outpatient Medications on File  Prior to Visit  Medication Sig Dispense Refill  . etonogestrel (NEXPLANON) 68 MG IMPL implant 1 each by Subdermal route once.    . naproxen (NAPROSYN) 375 MG tablet Take 1 tablet (375 mg total) by mouth 2 (two) times daily with a meal. As needed for menstrual cramping. 60 tablet 0  . ondansetron (ZOFRAN ODT) 4 MG disintegrating tablet Take 1-2 tablets (4-8 mg total) by mouth every 8 (eight) hours as needed for nausea or vomiting. 30 tablet 0  . scopolamine (TRANSDERM-SCOP, 1.5 MG,) 1 MG/3DAYS Place 1 patch (1.5 mg total) onto the skin every 3 (three) days. 4 patch 12  . tranexamic acid (LYSTEDA) 650 MG TABS tablet Take 2 tablets (1,300 mg total) by mouth 3 (three) times daily. Take during menses for a maximum of five days 30 tablet 2  . [DISCONTINUED] dicyclomine (BENTYL) 20 MG tablet Take 1 tablet (20 mg total) by mouth 2 (two) times daily. 20 tablet 0  . [DISCONTINUED] famotidine (PEPCID) 20 MG tablet Take 1 tablet (20 mg total) by mouth 2 (two) times daily. 30 tablet 0  . [DISCONTINUED] metoCLOPramide (REGLAN) 10 MG tablet Take 1 tablet (10 mg total) by mouth every 6 (six) hours as needed for nausea or vomiting. (Patient not taking: Reported on 09/19/2019) 30 tablet 0  . [DISCONTINUED] pantoprazole (PROTONIX) 40 MG tablet Take 40 mg twice a day for 8 weeks 120 tablet 1  . [DISCONTINUED] promethazine (PHENERGAN) 25 MG suppository Place 1 suppository (25 mg total) rectally every 6 (six) hours as needed for nausea or vomiting. (Patient not taking: Reported on 03/04/2020) 12 each 0  . [DISCONTINUED] sucralfate (CARAFATE) 1 GM/10ML suspension Take 10 mLs (1 g total) by mouth 4 (four) times daily -  with meals and at bedtime. 420 mL 0   Current Facility-Administered Medications on File Prior to Visit  Medication Dose Route Frequency Provider Last Rate Last Admin  . 0.9 %  sodium chloride infusion  500 mL Intravenous Once Cirigliano, Vito V, DO        BP 116/80   Pulse 90   Temp 97.8 F (36.6 C)  (Oral)   Ht 5\' 3"  (1.6 m)   Wt 132 lb (59.9 kg)   SpO2 100%   BMI 23.38 kg/m    Objective:   Physical Exam Pulmonary:     Effort: Pulmonary effort is normal.  Chest:  Breasts:     Right: Tenderness present. No swelling, nipple discharge or skin change.     Left: No swelling, nipple discharge or skin change.        Comments: Densities noted throughout both breasts. Soft, cyst like mass to right upper breast  noted at 11-12 o'clock positions. Non tender on exam. Skin:    General: Skin is warm and dry.     Findings: No erythema.  Neurological:     Mental Status: She is alert.            Assessment & Plan:

## 2020-12-22 NOTE — Patient Instructions (Signed)
You will be contacted regarding your breast ultrasound.  Please let us know if you have not been contacted within a few days.  It was a pleasure to see you today!

## 2021-01-02 ENCOUNTER — Other Ambulatory Visit: Payer: Self-pay | Admitting: Internal Medicine

## 2021-01-02 ENCOUNTER — Telehealth: Payer: Self-pay

## 2021-01-02 ENCOUNTER — Ambulatory Visit (INDEPENDENT_AMBULATORY_CARE_PROVIDER_SITE_OTHER): Payer: No Typology Code available for payment source | Admitting: Internal Medicine

## 2021-01-02 ENCOUNTER — Encounter: Payer: Self-pay | Admitting: Internal Medicine

## 2021-01-02 ENCOUNTER — Other Ambulatory Visit: Payer: Self-pay

## 2021-01-02 VITALS — BP 112/76 | HR 94 | Temp 98.1°F | Wt 131.0 lb

## 2021-01-02 DIAGNOSIS — N6310 Unspecified lump in the right breast, unspecified quadrant: Secondary | ICD-10-CM

## 2021-01-02 DIAGNOSIS — R0789 Other chest pain: Secondary | ICD-10-CM | POA: Diagnosis not present

## 2021-01-02 MED ORDER — IBUPROFEN 600 MG PO TABS
600.0000 mg | ORAL_TABLET | Freq: Three times a day (TID) | ORAL | 0 refills | Status: DC | PRN
Start: 2021-01-02 — End: 2021-01-02

## 2021-01-02 MED ORDER — METHOCARBAMOL 500 MG PO TABS: 500.0000 mg | ORAL_TABLET | Freq: Every evening | ORAL | 0 refills | Status: DC | PRN

## 2021-01-02 MED FILL — METHOCARBAMOL 500 MG TABS: 500 | 15 days supply | Qty: 15 | Fill #0

## 2021-01-02 MED FILL — IBUPROFEN 600 MG TABLET: 600 | 5 days supply | Qty: 15 | Fill #0

## 2021-01-02 NOTE — Telephone Encounter (Signed)
Per chart review tab pt already has appt with R Baity NP 01/02/21 at 11:45.

## 2021-01-02 NOTE — Telephone Encounter (Signed)
[facility] Night - Client TELEPHONE ADVICE RECORD AccessNurse Patient Name: Stephanie [name] Gender: Female DOB: July 11, 1996 Age: 25 Y 1 M 9 D Return Phone Number: [phone] (Primary) Address: City/State/Zip: [place] [address] Client [facility] Night - Client Client Site [facility] [place] - Night Physician Vernona Rieger - NP Contact Type Call Who Is Calling Patient / Member / Family / Caregiver Call Type Triage / Clinical Relationship To Patient Self Return Phone Number [phone] (Primary) Chief Complaint Breast Symptoms Reason for Call Symptomatic / Request for Health Information Initial Comment Caller states she saw her doctor a week ago concerning a lump in her breast. She went bowling two nights ago and her arm was really sore, but she thinks her soreness is because of her lump under her arm nearest her breast. Translation No Nurse Assessment Nurse: Scotty Court, RN, Lerry Paterson Date/Time (Eastern Time): 01/01/2021 8:29:55 PM Confirm and document reason for call. If symptomatic, describe symptoms. ---Caller states she saw her doctor a week ago concerning a lump in her breast. She went bowling two nights ago and her arm was really sore, but she thinks her soreness is because of her lump under her arm nearest her breast. No redness, unable to see but can feel it. Pain of 6/10. Does the patient have any new or worsening symptoms? ---Yes Will a triage be completed? ---Yes Related visit to physician within the last 2 weeks? ---Yes Does the PT have any chronic conditions? (i.e. diabetes, asthma, this includes High risk factors for pregnancy, etc.) ---No Is the patient pregnant or possibly pregnant? (Ask all females between the ages of 65-55) ---No Is this a behavioral health or substance abuse call? ---No Guidelines Guideline Title Affirmed Question Affirmed Notes Nurse Date/Time Lamount Cohen Time) Breast  Symptoms Breast lump Scotty Court, RN, Lerry Paterson 01/01/2021 8:31:56 PM Disp. Time Lamount Cohen Time) Disposition Final User 01/01/2021 8:37:20 PM SEE PCP WITHIN 3 DAYS Yes Barnette, RN, [place] PLEASE NOTE: All timestamps contained within this report are represented as [place] Standard Time. CONFIDENTIALTY NOTICE: This fax transmission is intended only for the addressee. It contains information that is legally privileged, confidential or otherwise protected from use or disclosure. If you are not the intended recipient, you are strictly prohibited from reviewing, disclosing, copying using or disseminating any of this information or taking any action in reliance on or regarding this information. If you have received this fax in error, please notify us immediately by telephone so that we can arrange for its return to [place]. Phone: [phone], Toll-Free: [phone], Fax: [phone] Page: 2 of 2 Call Id: [id number] Caller Disagree/Comply Comply Caller Understands Yes PreDisposition Call Doctor Care Advice Given Per Guideline SEE PCP WITHIN 3 DAYS: * You need to be seen within 2 or 3 days. * PCP VISIT: Call your doctor (or NP/PA) during regular office hours and make an appointment. A clinic or urgent care center are good places to go for care if your doctor's office is closed or you can't get an appointment. NOTE: If office will be open tomorrow, tell caller to call then, not in 3 days. CALL BACK IF: * Fever or redness occurs * You become worse CARE ADVICE given per Breast Symptoms (Adult) guideline. Comments User: Lerry Paterson, Scotty Court, RN Date/Time Lamount Cohen Time): 01/01/2021 8:38:41 PM Pt verbalized there was no redness in the area of lump. Referrals REFERRED TO PCP OFFICE

## 2021-01-02 NOTE — Telephone Encounter (Signed)
Will discuss at upcoming appt.

## 2021-01-02 NOTE — Patient Instructions (Signed)
Musculoskeletal Pain Musculoskeletal pain refers to aches and pains in your bones, joints, muscles, and the tissues that surround them. This pain can occur in any part of the body. It can last for a short time (acute) or a long time (chronic). A physical exam, lab tests, and imaging studies may be done to find the cause of your musculoskeletal pain. Follow these instructions at home: Lifestyle  Try to control or lower your stress levels. Stress increases muscle tension and can worsen musculoskeletal pain. It is important to recognize when you are anxious or stressed and learn ways to manage it. This may include: ? Meditation or yoga. ? Cognitive or behavioral therapy. ? Acupuncture or massage therapy.  You may continue all activities unless the activities cause more pain. When the pain gets better, slowly resume your normal activities. Gradually increase the intensity and duration of your activities or exercise. Managing pain, stiffness, and swelling  Treatment may include medicines for pain and inflammation that are taken by mouth or applied to the skin. Take over-the-counter and prescription medicines only as told by your health care provider.  When your pain is severe, bed rest may be helpful. Lie or sit in any position that is comfortable, but get out of bed and walk around at least every couple of hours.  If directed, apply heat to the affected area as often as told by your health care provider. Use the heat source that your health care provider recommends, such as a moist heat pack or a heating pad. ? Place a towel between your skin and the heat source. ? Leave the heat on for 20-30 minutes. ? Remove the heat if your skin turns bright red. This is especially important if you are unable to feel pain, heat, or cold. You may have a greater risk of getting burned.  If directed, put ice on the painful area. To do this: ? Put ice in a plastic bag. ? Place a towel between your skin and the  bag. ? Leave the ice on for 20 minutes, 2-3 times a day. ? Remove the ice if your skin turns bright red. This is very important. If you cannot feel pain, heat, or cold, you have a greater risk of damage to the area.      General instructions  Your health care provider may recommend that you see a physical therapist. This person can help you come up with a safe exercise program.  If told by your health care provider, do physical therapy exercises to improve movement and strength in the affected area.  Keep all follow-up visits. This is important. This includes any physical therapy visits. Contact a health care provider if:  Your pain gets worse.  Medicines do not help ease your pain.  You cannot use the part of your body that hurts, such as your arm, leg, or neck.  You have trouble sleeping.  You have trouble doing your normal activities. Get help right away if:  You have a new injury and your pain is worse or different.  You feel numb or you have tingling in the painful area. Summary  Musculoskeletal pain refers to aches and pains in your bones, joints, muscles, and the tissues that surround them.  This pain can occur in any part of the body.  Your health care provider may recommend that you see a physical therapist. This person can help you come up with a safe exercise program. Do any exercises as told by your physical therapist.    Lower your stress level. Stress can worsen musculoskeletal pain. Ways to lower stress may include meditation, yoga, cognitive or behavioral therapy, acupuncture, and massage therapy. This information is not intended to replace advice given to you by your health care provider. Make sure you discuss any questions you have with your health care provider. Document Revised: 03/10/2020 Document Reviewed: 02/17/2020 Elsevier Patient Education  2021 [employer]  

## 2021-01-02 NOTE — Progress Notes (Signed)
Subjective:    Patient ID: Stephanie [name], female    DOB: [birth year], 25 y.o.   MRN: [id number]  HPI  Pt presents to the clinic today with c/o pain underneath her right ribcage. She noticed this about 2 days ago. She reports the area is tender to touch but she has not noticed any redness, swelling or discharge. She does report a lump in her right breast. She was seen 2/3 for the same but mammogram/ultrasound has not been scheduled until March. She has not tried anything OTC for this. She reports history of Hidradenitis Supperativa but reports this is different.  Review of Systems  Past Medical History:  Diagnosis Date  . Acute pancreatitis without infection or necrosis 04/21/2019  . Anemia   . Anxiety 01/28/2013   no meds  . Depression    no meds  . IBS (irritable bowel syndrome)   . Migraines    HA every other day - otc med prn  . Recurrent streptococcal tonsillitis   . Seasonal allergies    Seasonal environmental allergies  . Sebaceous cyst 08/04/2018   L mid back   . Vision abnormalities    Myopia; patient has history of devloping ulcers when she wore contacts.    Current Outpatient Medications  Medication Sig Dispense Refill  . etonogestrel (NEXPLANON) 68 MG IMPL implant 1 each by Subdermal route once.    . naproxen (NAPROSYN) 375 MG tablet Take 1 tablet (375 mg total) by mouth 2 (two) times daily with a meal. As needed for menstrual cramping. 60 tablet 0  . ondansetron (ZOFRAN ODT) 4 MG disintegrating tablet Take 1-2 tablets (4-8 mg total) by mouth every 8 (eight) hours as needed for nausea or vomiting. 30 tablet 0  . scopolamine (TRANSDERM-SCOP, 1.5 MG,) 1 MG/3DAYS Place 1 patch (1.5 mg total) onto the skin every 3 (three) days. 4 patch 12  . tranexamic acid (LYSTEDA) 650 MG TABS tablet Take 2 tablets (1,300 mg total) by mouth 3 (three) times daily. Take during menses for a maximum of five days 30 tablet 2   Current Facility-Administered Medications  Medication Dose  Route Frequency Provider Last Rate Last Admin  . 0.9 %  sodium chloride infusion  500 mL Intravenous Once Cirigliano, Vito V, DO        Allergies  Allergen Reactions  . Coconut Flavor Other (See Comments)    Mouth itches, but no impaired breathing (per patient  . Septra [Sulfamethoxazole-Trimethoprim] Nausea Only and Other (See Comments)    Patient took a few courses of this and it ended up being stopped by a MD because it had started causing "bad stomach pain" (per the patient)    Family History  Problem Relation Age of Onset  . Depression Mother   . Migraines Mother   . Irritable bowel syndrome Mother   . Hypertension Mother   . Migraines Father   . Diabetes Father   . Diabetes Maternal Grandmother   . Hypertension Maternal Grandmother   . Diabetes Sister   . Stomach cancer Sister 46  . Colon cancer Neg Hx   . Esophageal cancer Neg Hx   . Rectal cancer Neg Hx     Social History   Socioeconomic History  . Marital status: Single    Spouse name: Not on file  . Number of children: 0  . Years of education: Not on file  . Highest education level: Not on file  Occupational History  . Occupation: Nanny  Tobacco Use  .  Smoking status: Never Smoker  . Smokeless tobacco: Never Used  Vaping Use  . Vaping Use: Never used  Substance and Sexual Activity  . Alcohol use: Yes    Comment: socially  . Drug use: Yes    Types: Marijuana    Comment: Last use Thursday 10/16/18  . Sexual activity: Not Currently    Partners: Male    Birth control/protection: Implant    Comment: broke up with first sexual partner 1 month ago  Other Topics Concern  . Not on file  Social History Narrative   Single.    Student at [employer].   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    Enjoys listening to music, hanging out with friends.    Social Determinants of Health   Financial Resource Strain: Not on file  Food Insecurity: Not on file  Transportation Needs: Not on file  Physical  Activity: Not on file  Stress: Not on file  Social Connections: Not on file  Intimate Partner Violence: Not on file     Constitutional: Denies fever, malaise, fatigue, headache or abrupt weight changes.  Respiratory: Denies difficulty breathing, shortness of breath, cough or sputum production.   Cardiovascular: Denies chest pain, chest tightness, palpitations or swelling in the hands or feet.  Skin: Pt reports mass of right breast. Denies redness, rashes, or ulcercations.  MSK: Pt report pain under right axilla, right shoulder pain. Denies decrease in ROM, joint swelling. Neurological: Denies numbness, tingling, weakness, or problems with coordination.    No other specific complaints in a complete review of systems (except as listed in HPI above).     Objective:   Physical Exam   BP 112/76   Pulse 94   Temp 98.1 F (36.7 C) (Temporal)   Wt 131 lb (59.4 kg)   SpO2 98%   BMI 23.21 kg/m   Wt Readings from Last 3 Encounters:  12/22/20 132 lb (59.9 kg)  10/11/20 126 lb (57.2 kg)  08/30/20 115 lb (52.2 kg)    General: Appears her stated age, well developed, well nourished in NAD. Skin: Warm, dry and intact. No changes in the skin of the breast. Cardiovascular: Normal rate. Pulmonary/Chest: Normal effort and positive vesicular breath sounds. No respiratory distress. No wheezes, rales or ronchi noted.  Breasts: Symmetrical. Fullness noted in the right breast at 7-8 oclock but no discrete mass noted. No axillary lymphadenopathy. Musculoskeletal: Normal internal and external rotation of the right shoulder. Negative drop can test on the right. Pain with palpation over the lateral rib cage just inferior to the axilla. Strength 5/5 BUE/BLE. No difficulty with gait.  Neurological: Alert and oriented.  Psychiatric: Anxious appearing.  BMET    Component Value Date/Time   NA 138 09/13/2020 1526   K 3.8 09/13/2020 1526   CL 105 09/13/2020 1526   CO2 19 (L) 09/13/2020 1526   GLUCOSE  77 09/13/2020 1526   BUN 10 09/13/2020 1526   CREATININE 0.86 09/13/2020 1526   CALCIUM 9.7 09/13/2020 1526   GFRNONAA >60 09/13/2020 1526   GFRAA >60 02/07/2020 1426    Lipid Panel     Component Value Date/Time   CHOL 132 11/20/2017 1054   TRIG 48.0 11/20/2017 1054   HDL 39.50 11/20/2017 1054   CHOLHDL 3 11/20/2017 1054   VLDL 9.6 11/20/2017 1054   LDLCALC 83 11/20/2017 1054    CBC    Component Value Date/Time   WBC 5.6 09/13/2020 1526   RBC 4.81 09/13/2020 1526   HGB 14.0  09/13/2020 1526   HCT 43.2 09/13/2020 1526   PLT 255 09/13/2020 1526   MCV 89.8 09/13/2020 1526   MCH 29.1 09/13/2020 1526   MCHC 32.4 09/13/2020 1526   RDW 13.2 09/13/2020 1526   LYMPHSABS 1.1 12/06/2019 1137   MONOABS 0.2 12/06/2019 1137   EOSABS 0.0 12/06/2019 1137   BASOSABS 0.0 12/06/2019 1137    Hgb A1C No results found for: HGBA1C         Assessment & Plan:   Right Side Chest Wall Pain:  Likely a strain RX for Ibuprofen 600 mg BID with food RX for Methocarbamol 500 mg QHS prn- sedation caution given  Right Breast Mass:  Will see if we can have her mammogram/ultrasound moved up  Return precautions discussed   Nicki Reaper, NP This visit occurred during the SARS-CoV-2 public health emergency.  Safety protocols were in place, including screening questions prior to the visit, additional usage of staff PPE, and extensive cleaning of exam room while observing appropriate contact time as indicated for disinfecting solutions.

## 2021-01-05 ENCOUNTER — Telehealth: Payer: Self-pay

## 2021-01-05 NOTE — Telephone Encounter (Signed)
Left detailed message for the patient letting her know that I was able to reschedule her Ultrasound at [facility] for sooner date, scheduled for 01/09/21 at 7:30 am. This was the only other sooner appointment.

## 2021-01-06 ENCOUNTER — Telehealth: Payer: Self-pay

## 2021-01-06 NOTE — Telephone Encounter (Signed)
Pt has been seen at GI and they did an endoscopy and ultrasound and some other work-up. She is asking if Jae Dire would order a HIDA Scan. Her mother had the same type of symptoms and it ended up being her Gallbladder. It took the HIDA Scan to figure that out. She is still having abdominal spasms. Will get pain when she eats. Please advise at [phone].

## 2021-01-08 NOTE — Telephone Encounter (Signed)
Noted, when was her last visit with GI? Has she followed up recently?  Who is she seeing?

## 2021-01-09 ENCOUNTER — Other Ambulatory Visit: Payer: Self-pay

## 2021-01-09 ENCOUNTER — Ambulatory Visit
Admission: RE | Admit: 2021-01-09 | Discharge: 2021-01-09 | Disposition: A | Discharge status: Home / Self Care | Payer: No Typology Code available for payment source | Source: Ambulatory Visit | Attending: Primary Care | Admitting: Primary Care

## 2021-01-09 DIAGNOSIS — N63 Unspecified lump in unspecified breast: Secondary | ICD-10-CM

## 2021-01-09 NOTE — Telephone Encounter (Signed)
Patient last seen by Dr. Barbaraann Faster in March.

## 2021-01-10 NOTE — Telephone Encounter (Signed)
Has she tried calling their office to see if they will order/followup regarding her symptoms? She underwent endoscopy in April 2021, recommendation was for repeat endoscopy 2 months later which would have been June 2021, I do not see evidence of this being done.  I advise her to call her GI doctor, mention her symptoms, see if she can get in for follow-up and evaluation.  If they cannot see her soon then have her call us back.

## 2021-01-11 NOTE — Telephone Encounter (Signed)
Called patient reviewed all information and repeated back to me. Will call if any questions.  She will let our office know if any issue getting in with GI

## 2021-01-23 ENCOUNTER — Other Ambulatory Visit: Payer: No Typology Code available for payment source

## 2021-04-04 ENCOUNTER — Ambulatory Visit: Payer: No Typology Code available for payment source | Admitting: Primary Care

## 2021-05-03 ENCOUNTER — Other Ambulatory Visit (HOSPITAL_COMMUNITY): Payer: Self-pay

## 2021-05-03 MED ORDER — CARESTART COVID-19 HOME TEST VI KIT
PACK | 0 refills | Status: DC
  Filled 2021-05-03: qty 4, 4d supply, fill #0

## 2021-05-25 ENCOUNTER — Telehealth: Payer: Self-pay | Admitting: Radiology

## 2021-05-25 ENCOUNTER — Telehealth: Payer: Self-pay | Admitting: Primary Care

## 2021-05-25 ENCOUNTER — Other Ambulatory Visit (HOSPITAL_COMMUNITY): Payer: Self-pay

## 2021-05-25 ENCOUNTER — Other Ambulatory Visit: Payer: Self-pay | Admitting: Obstetrics & Gynecology

## 2021-05-25 DIAGNOSIS — R112 Nausea with vomiting, unspecified: Secondary | ICD-10-CM

## 2021-05-25 DIAGNOSIS — N946 Dysmenorrhea, unspecified: Secondary | ICD-10-CM

## 2021-05-25 DIAGNOSIS — R109 Unspecified abdominal pain: Secondary | ICD-10-CM

## 2021-05-25 DIAGNOSIS — N920 Excessive and frequent menstruation with regular cycle: Secondary | ICD-10-CM

## 2021-05-25 MED ORDER — NAPROXEN 500 MG PO TABS
500.0000 mg | ORAL_TABLET | Freq: Two times a day (BID) | ORAL | 2 refills | Status: DC
  Filled 2021-05-25: qty 60, 30d supply, fill #0

## 2021-05-25 MED ORDER — TRANEXAMIC ACID 650 MG PO TABS
1300.0000 mg | ORAL_TABLET | Freq: Three times a day (TID) | ORAL | 1 refills | Status: DC
  Filled 2021-05-25: qty 30, 5d supply, fill #0

## 2021-05-25 MED ORDER — ONDANSETRON 4 MG PO TBDP
4.0000 mg | ORAL_TABLET | Freq: Three times a day (TID) | ORAL | 0 refills | Status: DC | PRN
  Filled 2021-05-25: qty 30, 5d supply, fill #0

## 2021-05-25 MED ORDER — SCOPOLAMINE 1 MG/3DAYS TD PT72
1.0000 | MEDICATED_PATCH | TRANSDERMAL | 12 refills | Status: DC
  Filled 2021-05-25: qty 4, 12d supply, fill #0

## 2021-05-25 NOTE — Telephone Encounter (Signed)
Patient call in stated she need's a referral to have her Ultrasound done . Please advise

## 2021-05-25 NOTE — Telephone Encounter (Signed)
Left message with Ultrasound appt information for 05/26/21 2 10:45 at [facility] medical mall.

## 2021-05-25 NOTE — Telephone Encounter (Signed)
[name] at [facility] called to let us know that Ultrasound did not need to be ordered from [place] but yes, need referral to Dr Macon Large today to make sure she is covered with her insurance. DX: abd cramps, vomiting during cycle.  Thank you.

## 2021-05-25 NOTE — Telephone Encounter (Signed)
Referral placed.

## 2021-05-25 NOTE — Telephone Encounter (Signed)
Checked with our staff. We need to do referral to gyn and patient needs to notify insurance it has been corded. I will call patient and let know she needs to call insurance.

## 2021-05-25 NOTE — Telephone Encounter (Signed)
Noted. Authorized it on their WQ

## 2021-05-25 NOTE — Progress Notes (Signed)
Patient called reporting heavy menstrual bleeding, associated with pain and severe nausea and vomiting. Was evaluated for this on 10/11/2020 (see my note), declined hormonal intervention.  Wanted refill of her meds and possible follow up evaluation; medications refilled as below.  Ultrasound scheduled for further evaluation and follow up appointment scheduled. Will need pap smear, GC/Chlam cultures during next visit. Advised to go to ER for acutely worsening symptoms.   1. Menorrhagia with regular cycle - tranexamic acid (LYSTEDA) 650 MG TABS tablet; Take 2 tablets (1,300 mg total) by mouth 3 (three) times daily. TAKE DURING MENSES FOR A MAXIMUM OF FIVE DAYS  Dispense: 30 tablet; Refill: 1 - US PELVIC COMPLETE WITH TRANSVAGINAL; Future  2. Dysmenorrhea - naproxen (NAPROSYN) 500 MG tablet; Take 1 tablet (500 mg total) by mouth 2 (two) times daily with a meal. As needed for menstrual cramping.  Dispense: 60 tablet; Refill: 2 - US PELVIC COMPLETE WITH TRANSVAGINAL; Future  3. Severe nausea and vomiting 4. Nausea and vomiting, intractability of vomiting not specified, unspecified vomiting type - scopolamine (TRANSDERM-SCOP) 1 MG/3DAYS; Place 1 patch (1.5 mg total) onto the skin every 3 (three) days.  Dispense: 4 patch; Refill: 12 - ondansetron (ZOFRAN ODT) 4 MG disintegrating tablet; Take 1-2 tablets (4-8 mg total) by mouth every 8 (eight) hours as needed for nausea or vomiting.  Dispense: 30 tablet; Refill: 0   Future Appointments  Date Time Provider Department Center  05/30/2021  1:45 PM Nichoel Digiulio, Jethro Bastos, MD [facility]    Jaynie Collins, MD, FACOG Obstetrician & Gynecologist, [facility] for [facility], [facility]

## 2021-05-25 NOTE — Telephone Encounter (Signed)
Ultrasound was ordered by GYN who placed order today.  Is she needing a referral to GYN? I'm not sure what is needed from our end.

## 2021-05-25 NOTE — Telephone Encounter (Signed)
Patient has [employer] insurance and needs referral put in for u/s. She has scheduled for tomorrow.

## 2021-05-26 ENCOUNTER — Other Ambulatory Visit: Payer: Self-pay

## 2021-05-26 ENCOUNTER — Ambulatory Visit
Admission: RE | Admit: 2021-05-26 | Discharge: 2021-05-26 | Disposition: A | Discharge status: Home / Self Care | Payer: No Typology Code available for payment source | Source: Ambulatory Visit | Attending: Obstetrics & Gynecology | Admitting: Obstetrics & Gynecology

## 2021-05-26 DIAGNOSIS — N920 Excessive and frequent menstruation with regular cycle: Secondary | ICD-10-CM | POA: Diagnosis present

## 2021-05-26 DIAGNOSIS — N946 Dysmenorrhea, unspecified: Secondary | ICD-10-CM | POA: Diagnosis not present

## 2021-05-30 ENCOUNTER — Telehealth: Payer: Self-pay | Admitting: Radiology

## 2021-05-30 ENCOUNTER — Ambulatory Visit: Payer: No Typology Code available for payment source | Admitting: Obstetrics & Gynecology

## 2021-05-30 NOTE — Telephone Encounter (Signed)
Patient called and left message to cancel appointment with Dr Macon Large on 05/30/21 @ 1:45. Called patient to cancel and reschedule, patient did not answer. Left voicemail to call office to reschedule cancelled appointment.

## 2021-06-02 ENCOUNTER — Other Ambulatory Visit (HOSPITAL_COMMUNITY): Payer: Self-pay

## 2021-06-26 ENCOUNTER — Other Ambulatory Visit: Payer: Self-pay

## 2021-06-26 ENCOUNTER — Telehealth: Payer: Self-pay | Admitting: *Deleted

## 2021-06-26 ENCOUNTER — Emergency Department (HOSPITAL_COMMUNITY)
Admission: EM | Admit: 2021-06-26 | Discharge: 2021-06-26 | Disposition: A | Discharge status: Home / Self Care | Payer: No Typology Code available for payment source | Attending: Emergency Medicine | Admitting: Emergency Medicine

## 2021-06-26 ENCOUNTER — Encounter (HOSPITAL_COMMUNITY): Payer: Self-pay | Admitting: *Deleted

## 2021-06-26 DIAGNOSIS — R112 Nausea with vomiting, unspecified: Secondary | ICD-10-CM | POA: Insufficient documentation

## 2021-06-26 DIAGNOSIS — N9489 Other specified conditions associated with female genital organs and menstrual cycle: Secondary | ICD-10-CM | POA: Insufficient documentation

## 2021-06-26 DIAGNOSIS — R739 Hyperglycemia, unspecified: Secondary | ICD-10-CM | POA: Insufficient documentation

## 2021-06-26 LAB — COMPREHENSIVE METABOLIC PANEL
ALT: 17 U/L (ref 0–44)
AST: 23 U/L (ref 15–41)
Albumin: 4.8 g/dL (ref 3.5–5.0)
Alkaline Phosphatase: 35 U/L — ABNORMAL LOW (ref 38–126)
Anion gap: 7 (ref 5–15)
BUN: 8 mg/dL (ref 6–20)
CO2: 26 mmol/L (ref 22–32)
Calcium: 9.7 mg/dL (ref 8.9–10.3)
Chloride: 110 mmol/L (ref 98–111)
Creatinine, Ser: 0.67 mg/dL (ref 0.44–1.00)
GFR, Estimated: >60 mL/min (ref >60)
Glucose, Bld: 114 mg/dL — ABNORMAL HIGH (ref 70–99)
Potassium: 4 mmol/L (ref 3.5–5.1)
Sodium: 143 mmol/L (ref 135–145)
Total Bilirubin: 0.6 mg/dL (ref 0.3–1.2)
Total Protein: 7.8 g/dL (ref 6.5–8.1)

## 2021-06-26 LAB — CBG MONITORING, ED: Glucose-Capillary: 106 mg/dL — ABNORMAL HIGH (ref 70–99)

## 2021-06-26 LAB — I-STAT BETA HCG BLOOD, ED (MC, WL, AP ONLY): I-stat hCG, quantitative: <5 m[IU]/mL (ref <5)

## 2021-06-26 LAB — CBC
HCT: 43.5 % (ref 36.0–46.0)
Hemoglobin: 14.7 g/dL (ref 12.0–15.0)
MCH: 31.6 pg (ref 26.0–34.0)
MCHC: 33.8 g/dL (ref 30.0–36.0)
MCV: 93.5 fL (ref 80.0–100.0)
Platelets: 224 10*3/uL (ref 150–400)
RBC: 4.65 MIL/uL (ref 3.87–5.11)
RDW: 12.4 % (ref 11.5–15.5)
WBC: 6.1 10*3/uL (ref 4.0–10.5)
nRBC: 0 % (ref 0.0–0.2)

## 2021-06-26 LAB — LIPASE, BLOOD: Lipase: 22 U/L (ref 11–51)

## 2021-06-26 MED ORDER — SODIUM CHLORIDE 0.9 % IV SOLN
25.0000 mg | Freq: Four times a day (QID) | INTRAVENOUS | Status: DC | PRN
  Administered 2021-06-26: 25 mg via INTRAVENOUS
  Filled 2021-06-26: qty 25

## 2021-06-26 MED ORDER — MORPHINE SULFATE (PF) 2 MG/ML IV SOLN
2.0000 mg | Freq: Once | INTRAVENOUS | Status: AC
Start: 2021-06-26 — End: 2021-06-26
  Administered 2021-06-26: 2 mg via INTRAVENOUS
  Filled 2021-06-26: qty 1

## 2021-06-26 NOTE — Telephone Encounter (Signed)
Patient called stating that she has diarrhea and has been vomiting all day. Patient stated that she has her period and has abdominal pain. Patient stated that she feels weak and like she is going to past out when she stands up. Patient stated that she feels dehydrated. Patient stated that her mouth and lips are dry and she has not been urinating like she should. Patient stated that her heart is racing also. Patient stated that she seems to have vomiting when she gets her period monthly. Patient stated that she has been working with her GYN to find the right medication to take to avoid this every month. Patient stated that she is wondering if she should go to the ER. Patient was advised if she feels that she is dehydrated and may need IV fluids she should go to the ER. Patient stated that she is [facility] to [facility] and will see if her uncle can take her. Patient was advised that she should not drive because of her symptoms and she verbalized understanding. Patient was advised if she gets worse before she can get to the ER to call 911.

## 2021-06-26 NOTE — Telephone Encounter (Signed)
Agree with recommendation for ER

## 2021-06-26 NOTE — ED Triage Notes (Signed)
Pt sent by PCP d/t abdominal pain and vomiting all morning. She reports feeling like she will pass out if standing up.

## 2021-06-26 NOTE — ED Provider Notes (Signed)
[place] DEPT Provider Note   CSN: [id number] Arrival date & time: 06/26/21  1520     History Chief Complaint  Patient presents with   Abdominal Pain    CALANDRIA MULLINGS is a 25 y.o. female.  HPI    25 yo go female history of menorrhagia presents today with nausea and vomiting after starting menstrual cycle yesterday.  SHe reports up to 15 episodes of nbnb emesis.  No fever or chills, although feels room is cool now.  History of same in past and took her zofran which she felt made her worse.  She is out scopolamine patches.  She was unable to keep tylenol down.  She denies uti symptoms, abnormal vaginal d/c.   Past Medical History:  Diagnosis Date   Acute pancreatitis without infection or necrosis 04/21/2019   Anemia    Anxiety 01/28/2013   no meds   Depression    no meds   IBS (irritable bowel syndrome)    Migraines    HA every other day - otc med prn   Recurrent streptococcal tonsillitis    Seasonal allergies    Seasonal environmental allergies   Sebaceous cyst 08/04/2018   L mid back    Vision abnormalities    Myopia; patient has history of devloping ulcers when she wore contacts.    Patient Active Problem List   Diagnosis Date Noted   Breast mass in female 12/22/2020   Dysmenorrhea 08/30/2020   Flank pain 07/10/2019   Near syncope 06/05/2019   Nausea and vomiting 04/20/2019   Axillary hidradenitis suppurativa 03/05/2019   Recurrent infection of skin 02/04/2019   Preventative health care 11/20/2017   Menorrhagia with regular cycle 09/24/2014   Migraines 08/04/2014   Allergic rhinitis 04/03/2013   MDD (major depressive disorder), single episode, severe ([place]) 01/28/2013    Class: Chronic   Generalized anxiety disorder 01/28/2013    Past Surgical History:  Procedure Laterality Date   COLONOSCOPY     IBS   COLONOSCOPY     IBS   LAPAROSCOPY N/A 10/20/2018   Procedure: LAPAROSCOPY DIAGNOSTIC possible removal of  endometriosis;  Surgeon: Linda Hedges, DO;  Location: [facility];  Service: Gynecology;  Laterality: N/A;   UPPER GASTROINTESTINAL ENDOSCOPY  03/04/2020     OB History     Gravida  0   Para  0   Term  0   Preterm  0   AB  0   Living  0      SAB  0   IAB  0   Ectopic  0   Multiple  0   Live Births  0        Obstetric Comments  Menstrual age: 31  Age 1st Pregnancy: N/A          Family History  Problem Relation Age of Onset   Depression Mother    Migraines Mother    Irritable bowel syndrome Mother    Hypertension Mother    Migraines Father    Diabetes Father    Diabetes Maternal Grandmother    Hypertension Maternal Grandmother    Diabetes Sister    Stomach cancer Sister 32   Colon cancer Neg Hx    Esophageal cancer Neg Hx    Rectal cancer Neg Hx     Social History   Tobacco Use   Smoking status: Never   Smokeless tobacco: Never  Vaping Use   Vaping Use: Never used  Substance Use Topics  Alcohol use: Yes    Comment: socially   Drug use: Yes    Types: Marijuana    Comment: Last use Thursday 10/16/18    Home Medications Prior to Admission medications   Medication Sig Start Date End Date Taking? Authorizing Provider  COVID-19 At Home Antigen Test [facility] COVID-19 HOME TEST) KIT Use as directed. 05/03/21   Jefm Bryant, RPH  etonogestrel (NEXPLANON) 68 MG IMPL implant 1 each by Subdermal route once. 09/30/18   [provider]  ibuprofen (ADVIL) 600 MG tablet TAKE 1 TABLET (600 MG TOTAL) BY MOUTH EVERY 8 (EIGHT) HOURS AS NEEDED. 01/02/21 01/02/22  Jearld Fenton, NP  methocarbamol (ROBAXIN) 500 MG tablet TAKE 1 TABLET (500 MG TOTAL) BY MOUTH AT BEDTIME AS NEEDED FOR MUSCLE SPASMS. 01/02/21 01/02/22  Jearld Fenton, NP  naproxen (NAPROSYN) 500 MG tablet Take 1 tablet (500 mg total) by mouth 2 (two) times daily with a meal. As needed for menstrual cramping. 05/25/21   Anyanwu, Sallyanne Havers, MD  ondansetron (ZOFRAN ODT) 4 MG disintegrating  tablet Take 1-2 tablets (4-8 mg total) by mouth every 8 (eight) hours as needed for nausea or vomiting. 05/25/21   Anyanwu, Sallyanne Havers, MD  scopolamine (TRANSDERM-SCOP) 1 MG/3DAYS Place 1 patch (1.5 mg total) onto the skin every 3 (three) days. 05/25/21   Anyanwu, Sallyanne Havers, MD  tranexamic acid (LYSTEDA) 650 MG TABS tablet Take 2 tablets (1,300 mg total) by mouth 3 (three) times daily. TAKE DURING MENSES FOR A MAXIMUM OF FIVE DAYS 05/25/21   Anyanwu, Sallyanne Havers, MD  dicyclomine (BENTYL) 20 MG tablet Take 1 tablet (20 mg total) by mouth 2 (two) times daily. 10/26/19 05/17/20  Joy, Shawn C, PA-C  famotidine (PEPCID) 20 MG tablet Take 1 tablet (20 mg total) by mouth 2 (two) times daily. 02/07/20 05/17/20  Henderly, Britni A, PA-C  metoCLOPramide (REGLAN) 10 MG tablet Take 1 tablet (10 mg total) by mouth every 6 (six) hours as needed for nausea or vomiting. Patient not taking: Reported on 09/19/2019 06/19/19 09/19/19  Caccavale, Sophia, PA-C  pantoprazole (PROTONIX) 40 MG tablet Take 40 mg twice a day for 8 weeks 03/04/20 05/17/20  Cirigliano, Dominic Pea, DO  promethazine (PHENERGAN) 25 MG suppository Place 1 suppository (25 mg total) rectally every 6 (six) hours as needed for nausea or vomiting. Patient not taking: Reported on 03/04/2020 02/07/20 05/17/20  Henderly, Britni A, PA-C  sucralfate (CARAFATE) 1 GM/10ML suspension Take 10 mLs (1 g total) by mouth 4 (four) times daily -  with meals and at bedtime. 02/07/20 05/17/20  Henderly, Britni A, PA-C    Allergies    Coconut flavor and Septra [sulfamethoxazole-trimethoprim]  Review of Systems   Review of Systems  All other systems reviewed and are negative.  Physical Exam Updated Vital Signs BP (!) 132/94 (BP Location: Left Arm)   Pulse 73   Temp 98.9 F (37.2 C) (Oral)   Resp 18   Ht 1.6 m (5' 3" )   Wt 56.9 kg   LMP 06/26/2021   SpO2 100%   BMI 22.23 kg/m   Physical Exam Vitals and nursing note reviewed.  Constitutional:      Appearance: She is normal weight.   HENT:     Head: Normocephalic.     Mouth/Throat:     Mouth: Mucous membranes are moist.  Eyes:     Extraocular Movements: Extraocular movements intact.  Cardiovascular:     Rate and Rhythm: Normal rate and regular rhythm.  Heart sounds: Normal heart sounds.  Pulmonary:     Effort: Pulmonary effort is normal.  Abdominal:     General: Abdomen is flat. Bowel sounds are normal.     Palpations: Abdomen is soft.  Skin:    General: Skin is warm and dry.  Neurological:     Mental Status: She is alert.    ED Results / Procedures / Treatments   Labs (all labs ordered are listed, but only abnormal results are displayed) Labs Reviewed  COMPREHENSIVE METABOLIC PANEL - Abnormal; Notable for the following components:      Result Value   Glucose, Bld 114 (*)    Alkaline Phosphatase 35 (*)    All other components within normal limits  CBG MONITORING, ED - Abnormal; Notable for the following components:   Glucose-Capillary 106 (*)    All other components within normal limits  LIPASE, BLOOD  CBC  URINALYSIS, ROUTINE W REFLEX MICROSCOPIC  I-STAT BETA HCG BLOOD, ED ([facility], [facility], [facility] ONLY)    EKG None  Radiology No results found.  Procedures Procedures   Medications Ordered in ED Medications  promethazine (PHENERGAN) 25 mg in sodium chloride 0.9 % 50 mL IVPB (has no administration in time range)  morphine 2 MG/ML injection 2 mg (has no administration in time range)    ED Course  I have reviewed the triage vital signs and the nursing notes.  Pertinent labs & imaging results that were available during my care of the patient were reviewed by me and considered in my medical decision making (see chart for details).    MDM Rules/Calculators/A&P                           Patient received a liter of saline, morphine, and Phenergan and has been tolerating p.o.  She feels improved and is ready for discharge. Final Clinical Impression(s) / ED Diagnoses Final diagnoses:  Nausea and  vomiting, intractability of vomiting not specified, unspecified vomiting type    Rx / DC Orders ED Discharge Orders     None        Pattricia Boss, MD 06/26/21 2224

## 2021-08-02 ENCOUNTER — Ambulatory Visit (INDEPENDENT_AMBULATORY_CARE_PROVIDER_SITE_OTHER): Payer: No Typology Code available for payment source | Admitting: Primary Care

## 2021-08-02 ENCOUNTER — Other Ambulatory Visit: Payer: Self-pay

## 2021-08-02 ENCOUNTER — Encounter: Payer: Self-pay | Admitting: Primary Care

## 2021-08-02 ENCOUNTER — Other Ambulatory Visit: Payer: Self-pay | Admitting: Primary Care

## 2021-08-02 ENCOUNTER — Other Ambulatory Visit (HOSPITAL_COMMUNITY): Payer: Self-pay

## 2021-08-02 VITALS — BP 118/70 | HR 104 | Temp 98.1°F | Ht 63.0 in | Wt 126.0 lb

## 2021-08-02 DIAGNOSIS — G4452 New daily persistent headache (NDPH): Secondary | ICD-10-CM | POA: Insufficient documentation

## 2021-08-02 DIAGNOSIS — R112 Nausea with vomiting, unspecified: Secondary | ICD-10-CM

## 2021-08-02 DIAGNOSIS — N946 Dysmenorrhea, unspecified: Secondary | ICD-10-CM

## 2021-08-02 DIAGNOSIS — F411 Generalized anxiety disorder: Secondary | ICD-10-CM

## 2021-08-02 DIAGNOSIS — G43709 Chronic migraine without aura, not intractable, without status migrainosus: Secondary | ICD-10-CM

## 2021-08-02 DIAGNOSIS — F322 Major depressive disorder, single episode, severe without psychotic features: Secondary | ICD-10-CM

## 2021-08-02 MED ORDER — ESCITALOPRAM OXALATE 5 MG PO TABS
5.0000 mg | ORAL_TABLET | Freq: Every day | ORAL | 1 refills | Status: DC
Start: 2021-08-02 — End: 2022-04-20
  Filled 2021-08-02: qty 30, 30d supply, fill #0
  Filled 2021-09-05: qty 30, 30d supply, fill #1

## 2021-08-02 NOTE — Progress Notes (Signed)
Subjective:    Patient ID: Stephanie [name], female    DOB: 1996-07-07, 25 y.o.   MRN: [id number]  HPI  Stephanie [name] is a very pleasant 25 y.o. female with a history of menorrhagia, dysmenorrhea, nausea and vomiting, acute pancreatitis who presents today to discuss depression and nausea.   1) Anxiety and Depression: Chronic symptoms for which she believes dates back to 2014. Symptoms include headaches, fatigue, sleeping a lot, tearful, feeling down. She is unhappy with her job.   Evaluated in 2019 for symptoms of anxiety and depression, referred to therapy. She found this beneficial but was cost prohibitive. She was initiated on Zoloft which helped with symptoms but caused side effects.   She was managed on Lexapro in 2014, doesn't recall if she had any problems. She is interested in resuming treatment including therapy and medication.   PHQ9 SCORE ONLY 08/02/2021 11/20/2017  PHQ-9 Total Score 21 15   GAD 7 : Generalized Anxiety Score 08/02/2021 11/20/2017  Nervous, Anxious, on Edge 3 3  Control/stop worrying 3 3  Worry too much - different things 3 3  Trouble relaxing 1 1  Restless 2 1  Easily annoyed or irritable 3 3  Afraid - awful might happen 2 0  Total GAD 7 Score 17 14  Anxiety Difficulty Very difficult Somewhat difficult      2) Nausea and Vomiting: Chronic intermittent history of nausea and vomiting with numerous prior ED visits. One ED visit confirming acute pancreatitis with Lipase of 163 and 218, no hospital admission. Episodes of nausea are daily, vomiting is cyclical with menses along with abdominal cramping.    Also following with GYN for menorrhagia and dysmenorrhea, has Nexplanon. Last visit with GYN was in November 2021. She had an appointment scheduled with GYN in July 2022 but cancelled. She is currently managed on tranexamic acid 650 mg PRN during menses. She underwent pelvic ultrasound in July 2022 which was negative.   She's tried Zofran, Carafate,  Protonix, Phenergan, Scopolamine patches for nausea. The scopolamine patches have helped the most, but she has breakthrough symptoms. Underwent evaluation per GI, completed and endoscopy and diagnosed with "IBS".   She is concerned regarding her ongoing symptoms, suspects these symptoms are hormonal and would like to see endocrinology.   She mentions a week long nagging mid occipital lobe headache for which she describes as throbbing and constant. She had two episodes of vomiting since this headache began. She's taken Tylenol without improvement. She does note mild lightheadedness sporadically. She has a long history of frontal migraines, now occurring three times weekly, was once managed on treatment, overall manages with OTC medications and sleep with resolve.   She denies blurred vision. She is drinking plenty of water. She underwent MRI brain in 2009 for her migraines which was normal.     Review of Systems  Constitutional:  Positive for fatigue.  Gastrointestinal:  Positive for nausea and vomiting.  Genitourinary:  Positive for menstrual problem and pelvic pain.  Neurological:  Positive for headaches.  Psychiatric/Behavioral:         See HPI        Past Medical History:  Diagnosis Date   Acute pancreatitis without infection or necrosis 04/21/2019   Anemia    Anxiety 01/28/2013   no meds   Depression    no meds   IBS (irritable bowel syndrome)    Migraines    HA every other day - otc med prn   Recurrent streptococcal tonsillitis  Seasonal allergies    Seasonal environmental allergies   Sebaceous cyst 08/04/2018   L mid back    Vision abnormalities    Myopia; patient has history of devloping ulcers when she wore contacts.    Social History   Socioeconomic History   Marital status: Single    Spouse name: Not on file   Number of children: 0   Years of education: Not on file   Highest education level: Not on file  Occupational History   Occupation: Nanny  Tobacco Use    Smoking status: Never   Smokeless tobacco: Never  Vaping Use   Vaping Use: Never used  Substance and Sexual Activity   Alcohol use: Yes    Comment: socially   Drug use: Yes    Types: Marijuana    Comment: Last use Thursday 10/16/18   Sexual activity: Not Currently    Partners: Male    Birth control/protection: Implant    Comment: broke up with first sexual partner 1 month ago  Other Topics Concern   Not on file  Social History Narrative   Single.    Student at [employer].   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    Enjoys listening to music, hanging out with friends.    Social Determinants of Health   Financial Resource Strain: Not on file  Food Insecurity: Not on file  Transportation Needs: Not on file  Physical Activity: Not on file  Stress: Not on file  Social Connections: Not on file  Intimate Partner Violence: Not on file    Past Surgical History:  Procedure Laterality Date   COLONOSCOPY     IBS   COLONOSCOPY     IBS   LAPAROSCOPY N/A 10/20/2018   Procedure: LAPAROSCOPY DIAGNOSTIC possible removal of endometriosis;  Surgeon: Mitchel Honour, DO;  Location: [facility];  Service: Gynecology;  Laterality: N/A;   UPPER GASTROINTESTINAL ENDOSCOPY  03/04/2020    Family History  Problem Relation Age of Onset   Depression Mother    Migraines Mother    Irritable bowel syndrome Mother    Hypertension Mother    Migraines Father    Diabetes Father    Diabetes Maternal Grandmother    Hypertension Maternal Grandmother    Diabetes Sister    Stomach cancer Sister 38   Colon cancer Neg Hx    Esophageal cancer Neg Hx    Rectal cancer Neg Hx     Allergies  Allergen Reactions   Almond (Diagnostic) Other (See Comments)    [place]   Coconut Flavor Other (See Comments)    Mouth itches, but no impaired breathing (per patient   Septra [Sulfamethoxazole-Trimethoprim] Nausea Only and Other (See Comments)    Patient took a few courses of this and it ended up being  stopped by a MD because it had started causing "bad stomach pain" (per the patient)    Current Outpatient Medications on File Prior to Visit  Medication Sig Dispense Refill   acetaminophen (TYLENOL) 500 MG tablet Take 1,000 mg by mouth every 6 (six) hours as needed.     etonogestrel (NEXPLANON) 68 MG IMPL implant 1 each by Subdermal route continuous.     ibuprofen (ADVIL) 600 MG tablet TAKE 1 TABLET (600 MG TOTAL) BY MOUTH EVERY 8 (EIGHT) HOURS AS NEEDED. 15 tablet 0   naproxen (NAPROSYN) 500 MG tablet Take 1 tablet (500 mg total) by mouth 2 (two) times daily with a meal. As needed for menstrual cramping. 60 tablet 2  ondansetron (ZOFRAN ODT) 4 MG disintegrating tablet Take 1-2 tablets (4-8 mg total) by mouth every 8 (eight) hours as needed for nausea or vomiting. 30 tablet 0   scopolamine (TRANSDERM-SCOP) 1 MG/3DAYS Place 1 patch (1.5 mg total) onto the skin every 3 (three) days. 4 patch 12   tranexamic acid (LYSTEDA) 650 MG TABS tablet Take 2 tablets (1,300 mg total) by mouth 3 (three) times daily. TAKE DURING MENSES FOR A MAXIMUM OF FIVE DAYS (Patient not taking: No sig reported) 30 tablet 1   [DISCONTINUED] dicyclomine (BENTYL) 20 MG tablet Take 1 tablet (20 mg total) by mouth 2 (two) times daily. 20 tablet 0   [DISCONTINUED] famotidine (PEPCID) 20 MG tablet Take 1 tablet (20 mg total) by mouth 2 (two) times daily. 30 tablet 0   [DISCONTINUED] metoCLOPramide (REGLAN) 10 MG tablet Take 1 tablet (10 mg total) by mouth every 6 (six) hours as needed for nausea or vomiting. (Patient not taking: Reported on 09/19/2019) 30 tablet 0   [DISCONTINUED] pantoprazole (PROTONIX) 40 MG tablet Take 40 mg twice a day for 8 weeks 120 tablet 1   [DISCONTINUED] promethazine (PHENERGAN) 25 MG suppository Place 1 suppository (25 mg total) rectally every 6 (six) hours as needed for nausea or vomiting. (Patient not taking: Reported on 03/04/2020) 12 each 0   [DISCONTINUED] sucralfate (CARAFATE) 1 GM/10ML suspension  Take 10 mLs (1 g total) by mouth 4 (four) times daily -  with meals and at bedtime. 420 mL 0   Current Facility-Administered Medications on File Prior to Visit  Medication Dose Route Frequency Provider Last Rate Last Admin   0.9 %  sodium chloride infusion  500 mL Intravenous Once Cirigliano, Vito V, DO        BP 118/70   Pulse (!) 104   Temp 98.1 F (36.7 C) (Temporal)   Ht 5\' 3"  (1.6 m)   Wt 126 lb (57.2 kg)   SpO2 100%   BMI 22.32 kg/m  Objective:   Physical Exam Eyes:     Extraocular Movements: Extraocular movements intact.  Cardiovascular:     Rate and Rhythm: Normal rate and regular rhythm.  Pulmonary:     Effort: Pulmonary effort is normal.     Breath sounds: Normal breath sounds.  Skin:    General: Skin is warm and dry.  Neurological:     Mental Status: She is oriented to person, place, and time.     Cranial Nerves: No cranial nerve deficit.     Coordination: Coordination normal.  Psychiatric:        Mood and Affect: Mood normal.     Comments: Appears depressed          Assessment & Plan:      This visit occurred during the SARS-CoV-2 public health emergency.  Safety protocols were in place, including screening questions prior to the visit, additional usage of staff PPE, and extensive cleaning of exam room while observing appropriate contact time as indicated for disinfecting solutions.

## 2021-08-02 NOTE — Assessment & Plan Note (Addendum)
New headaches coupled with recurrent nausea and vomiting suspicious. Neuro exam unmarkable.   Will update MRI brain.  Checking labs.  Await results.

## 2021-08-02 NOTE — Assessment & Plan Note (Addendum)
Uncontrolled with persistent symptoms. GAD 7 and PHQ 9 scores have increased since 2019.  Discussed options for treatment, we decided to refer to therapy and start medication.   Start 5 mg Lexapro daily Referral to Psychology for therapy ordered.  Follow up in 6 weeks.   I evaluated patient, was consulted regarding treatment, and agree with assessment and plan per Omair Dettmer, RN, DNP student.   Kate Clark, NP-C  

## 2021-08-02 NOTE — Assessment & Plan Note (Addendum)
Frequently occurring, discussed this today. Will update MRI brain, especially given new headache. She agrees.  Neuro exam unremarkable.  Will need to discuss migraine treatment at next visit.  I evaluated patient, was consulted regarding treatment, and agree with assessment and plan per Kathaleen Maser, RN, DNP student.   Mayra Reel, NP-C

## 2021-08-02 NOTE — Assessment & Plan Note (Addendum)
Uncontrolled and persistent despite GI and GYN workup. Do suspect anxiety to be contributing.   Will update MRI brain given chronic and new headaches.  We will defer endocrinology referral. as it may not be applicable.   Will check labs to rule out metabolic cause. Free T4, TSH, BMET pending   Encouraged patient to follow up with GYN for abdominal cramping and cyclical symptoms.  Await results.   I evaluated patient, was consulted regarding treatment, and agree with assessment and plan per Kathaleen Maser, RN, DNP student.   Mayra Reel, NP-C

## 2021-08-02 NOTE — Patient Instructions (Addendum)
Start Escitalopram (Lexapro) 5 mg for anxiety and depression. Take 1 tablet by mouth once daily.   You will be contacted regarding your referral to therapy and your brain MRI. Please let us know if you have not been contacted within two weeks.    Stop by the lab prior to leaving today. I will notify you of your results once received.   I would like to see you back in 6 weeks, this can be a virtual visit.    I encourage you to follow up with your gynecologist soon.

## 2021-08-02 NOTE — Assessment & Plan Note (Addendum)
Uncontrolled.  Encouraged patient to schedule GYN visit.  Reviewed pelvic ultrasound from July 2022.  I evaluated patient, was consulted regarding treatment, and agree with assessment and plan per Kathaleen Maser, RN, DNP student.   Mayra Reel, NP-C

## 2021-08-02 NOTE — Progress Notes (Signed)
Established Patient Office Visit  Subjective:  Patient ID: Stephanie [name], female    DOB: 09/12/1996  Age: 25 y.o. MRN: [id number]  CC:  Chief Complaint  Patient presents with   Nausea    And vomiting with very bad cramps and bleeding with cycle. Has been seen by GYN. Feels like it may be hormonal. Would like to be seen by endo for evaluation    Depression    Symptoms have increased in last 6 months. But have received comments from people in the last two months about her actions. No thoughts or plan of self harm.       HPI Stephanie [name] is a 25 year old female with pertinent past medical history of intractable nausea/vomiting, pancreatitis, dysmenorrhea, MDD, and GAD presenting today for nausea and depression.   Nausea  Has had multiple admissions to the ED in the past for this, last was in August 2022. Was treated with phenergan, morphine, saline and discharged home. She has evaluated by GI and has undergone EGD which was unremarkable.She was treated for acute pancreatitis in June 2020. Lipase level was 218 in March 2021.  She has daily nausea and random episodes of vomiting throughout the month. The onset of the severe nausea and vomiting episodes occur at the beginning of her menses. She uses Scopolamine patches which helps, although she still has breakthrough vomiting. She stopped taking Zofran and uses ginger candies instead which also helps. She only eats one meal per day related to poor appetite. No abdominal pain today.   Depression Has a history of depression since 2014. She is feeling more tired and tearful than normal. She is taking 2-3 naps daily which is more sleep than normal for her. She is tearful daily. She has tried therapy in the past, but has not been in almost a year related to cost, as she lost her job and could not afford it. She has also tried 25 mg of Zoloft in the past but stopped taking it related to decreased sex drive and visual disturbances. She  is open to returning to therapy and medication for anxiety and depression. Her support system is her boyfriend, who she lives with. She denies suicidal thoughts.  GAD 7 and PHQ-9 scores have increased since 2019.  GAD 7 : Generalized Anxiety Score 08/02/2021 11/20/2017  Nervous, Anxious, on Edge 3 3  Control/stop worrying 3 3  Worry too much - different things 3 3  Trouble relaxing 1 1  Restless 2 1  Easily annoyed or irritable 3 3  Afraid - awful might happen 2 0  Total GAD 7 Score 17 14  Anxiety Difficulty Very difficult Somewhat difficult   PHQ9 SCORE ONLY 08/02/2021 11/20/2017  PHQ-9 Total Score 21 15    Past Medical History:  Diagnosis Date   Acute pancreatitis without infection or necrosis 04/21/2019   Anemia    Anxiety 01/28/2013   no meds   Depression    no meds   IBS (irritable bowel syndrome)    Migraines    HA every other day - otc med prn   Recurrent streptococcal tonsillitis    Seasonal allergies    Seasonal environmental allergies   Sebaceous cyst 08/04/2018   L mid back    Vision abnormalities    Myopia; patient has history of devloping ulcers when she wore contacts.    Past Surgical History:  Procedure Laterality Date   COLONOSCOPY     IBS   COLONOSCOPY  IBS   LAPAROSCOPY N/A 10/20/2018   Procedure: LAPAROSCOPY DIAGNOSTIC possible removal of endometriosis;  Surgeon: Linda Hedges, DO;  Location: [facility];  Service: Gynecology;  Laterality: N/A;   UPPER GASTROINTESTINAL ENDOSCOPY  03/04/2020    Family History  Problem Relation Age of Onset   Depression Mother    Migraines Mother    Irritable bowel syndrome Mother    Hypertension Mother    Migraines Father    Diabetes Father    Diabetes Maternal Grandmother    Hypertension Maternal Grandmother    Diabetes Sister    Stomach cancer Sister 39   Colon cancer Neg Hx    Esophageal cancer Neg Hx    Rectal cancer Neg Hx     Social History   Socioeconomic History   Marital status: Single    Spouse  name: Not on file   Number of children: 0   Years of education: Not on file   Highest education level: Not on file  Occupational History   Occupation: Nanny  Tobacco Use   Smoking status: Never   Smokeless tobacco: Never  Vaping Use   Vaping Use: Never used  Substance and Sexual Activity   Alcohol use: Yes    Comment: socially   Drug use: Yes    Types: Marijuana    Comment: Last use Thursday 10/16/18   Sexual activity: Not Currently    Partners: Male    Birth control/protection: Implant    Comment: broke up with first sexual partner 1 month ago  Other Topics Concern   Not on file  Social History Narrative   Single.    Student at [employer].   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    Enjoys listening to music, hanging out with friends.    Social Determinants of Health   Financial Resource Strain: Not on file  Food Insecurity: Not on file  Transportation Needs: Not on file  Physical Activity: Not on file  Stress: Not on file  Social Connections: Not on file  Intimate Partner Violence: Not on file    Outpatient Medications Prior to Visit  Medication Sig Dispense Refill   acetaminophen (TYLENOL) 500 MG tablet Take 1,000 mg by mouth every 6 (six) hours as needed.     etonogestrel (NEXPLANON) 68 MG IMPL implant 1 each by Subdermal route continuous.     ibuprofen (ADVIL) 600 MG tablet TAKE 1 TABLET (600 MG TOTAL) BY MOUTH EVERY 8 (EIGHT) HOURS AS NEEDED. 15 tablet 0   naproxen (NAPROSYN) 500 MG tablet Take 1 tablet (500 mg total) by mouth 2 (two) times daily with a meal. As needed for menstrual cramping. 60 tablet 2   ondansetron (ZOFRAN ODT) 4 MG disintegrating tablet Take 1-2 tablets (4-8 mg total) by mouth every 8 (eight) hours as needed for nausea or vomiting. 30 tablet 0   scopolamine (TRANSDERM-SCOP) 1 MG/3DAYS Place 1 patch (1.5 mg total) onto the skin every 3 (three) days. 4 patch 12   tranexamic acid (LYSTEDA) 650 MG TABS tablet Take 2 tablets (1,300 mg total)  by mouth 3 (three) times daily. TAKE DURING MENSES FOR A MAXIMUM OF FIVE DAYS (Patient not taking: No sig reported) 30 tablet 1   COVID-19 At Home Antigen Test (CARESTART COVID-19 HOME TEST) KIT Use as directed. 4 each 0   methocarbamol (ROBAXIN) 500 MG tablet TAKE 1 TABLET (500 MG TOTAL) BY MOUTH AT BEDTIME AS NEEDED FOR MUSCLE SPASMS. (Patient not taking: No sig reported) 15 tablet 0  Facility-Administered Medications Prior to Visit  Medication Dose Route Frequency Provider Last Rate Last Admin   0.9 %  sodium chloride infusion  500 mL Intravenous Once Cirigliano, Vito V, DO        Allergies  Allergen Reactions   Almond (Diagnostic) Other (See Comments)    [place]   Coconut Flavor Other (See Comments)    Mouth itches, but no impaired breathing (per patient   Septra [Sulfamethoxazole-Trimethoprim] Nausea Only and Other (See Comments)    Patient took a few courses of this and it ended up being stopped by a MD because it had started causing "bad stomach pain" (per the patient)    ROS Review of Systems  Respiratory:  Positive for shortness of breath.        Related to anxiety, not currently exhibiting  Cardiovascular:  Positive for palpitations.       Related to anxiety, not currently exhibiting  Gastrointestinal:  Negative for vomiting.  Neurological:  Positive for dizziness. Negative for light-headedness.       Intermittent dizziness since onset of occipital headache     Objective:   Physical Exam Vitals reviewed.  Constitutional:      Appearance: Normal appearance.  Cardiovascular:     Rate and Rhythm: Normal rate and regular rhythm.     Heart sounds: Normal heart sounds.  Pulmonary:     Effort: Pulmonary effort is normal.     Breath sounds: Normal breath sounds.  Abdominal:     General: Abdomen is flat. Bowel sounds are normal. There is no distension.     Palpations: Abdomen is soft. There is no mass.     Tenderness: There is no abdominal tenderness. There is no  guarding or rebound.  Skin:    General: Skin is warm and dry.  Neurological:     Mental Status: She is alert.  Psychiatric:     Comments: Pt tearful      BP 118/70   Pulse (!) 104   Temp 98.1 F (36.7 C) (Temporal)   Ht 5' 3"  (1.6 m)   Wt 126 lb (57.2 kg)   SpO2 100%   BMI 22.32 kg/m  Wt Readings from Last 3 Encounters:  08/02/21 126 lb (57.2 kg)  06/26/21 125 lb 8 oz (56.9 kg)  01/02/21 131 lb (59.4 kg)     Health Maintenance Due  Topic Date Due   COVID-19 Vaccine (1) Never done   HPV VACCINES (1 - 2-dose series) Never done   Hepatitis C Screening  Never done   TETANUS/TDAP  02/19/2018   CHLAMYDIA SCREENING  02/12/2019   PAP-Cervical Cytology Screening  02/11/2021   PAP SMEAR-Modifier  02/11/2021       Topic Date Due   HPV VACCINES (1 - 2-dose series) Never done    Lab Results  Component Value Date   TSH 0.54 06/08/2019   Lab Results  Component Value Date   WBC 6.1 06/26/2021   HGB 14.7 06/26/2021   HCT 43.5 06/26/2021   MCV 93.5 06/26/2021   PLT 224 06/26/2021   Lab Results  Component Value Date   NA 143 06/26/2021   K 4.0 06/26/2021   CO2 26 06/26/2021   GLUCOSE 114 (H) 06/26/2021   BUN 8 06/26/2021   CREATININE 0.67 06/26/2021   BILITOT 0.6 06/26/2021   ALKPHOS 35 (L) 06/26/2021   AST 23 06/26/2021   ALT 17 06/26/2021   PROT 7.8 06/26/2021   ALBUMIN 4.8 06/26/2021   CALCIUM 9.7 06/26/2021  ANIONGAP 7 06/26/2021   GFR 103.51 06/05/2019   Lab Results  Component Value Date   CHOL 132 11/20/2017   Lab Results  Component Value Date   HDL 39.50 11/20/2017   Lab Results  Component Value Date   LDLCALC 83 11/20/2017   Lab Results  Component Value Date   TRIG 48.0 11/20/2017   Lab Results  Component Value Date   CHOLHDL 3 11/20/2017   No results found for: HGBA1C    Assessment & Plan:   Problem List Items Addressed This Visit   None   No orders of the defined types were placed in this encounter.   Follow-up: No  follow-ups on file.    Ninfa Meeker, RN

## 2021-08-02 NOTE — Assessment & Plan Note (Addendum)
Uncontrolled with persistent symptoms. GAD 7 and PHQ 9 scores have increased since 2019.  Discussed options for treatment, we decided to refer to therapy and start medication.   Start 5 mg Lexapro daily Referral to Psychology for therapy ordered.  Follow up in 6 weeks.   I evaluated patient, was consulted regarding treatment, and agree with assessment and plan per Kathaleen Maser, RN, DNP student.   Mayra Reel, NP-C

## 2021-08-03 LAB — BASIC METABOLIC PANEL
BUN/Creatinine Ratio: 12 (ref 9–23)
BUN: 11 mg/dL (ref 6–20)
CO2: 22 mmol/L (ref 20–29)
Calcium: 9.5 mg/dL (ref 8.7–10.2)
Chloride: 101 mmol/L (ref 96–106)
Creatinine, Ser: 0.93 mg/dL (ref 0.57–1.00)
Glucose: 83 mg/dL (ref 65–99)
Potassium: 4.2 mmol/L (ref 3.5–5.2)
Sodium: 137 mmol/L (ref 134–144)
eGFR: 88 mL/min/{1.73_m2} (ref >59)

## 2021-08-03 LAB — T4, FREE: Free T4: 1.41 ng/dL (ref 0.82–1.77)

## 2021-08-07 ENCOUNTER — Ambulatory Visit: Payer: No Typology Code available for payment source

## 2021-08-08 ENCOUNTER — Other Ambulatory Visit: Payer: Self-pay

## 2021-08-08 ENCOUNTER — Ambulatory Visit
Admission: RE | Admit: 2021-08-08 | Discharge: 2021-08-08 | Disposition: A | Discharge status: Home / Self Care | Payer: No Typology Code available for payment source | Source: Ambulatory Visit | Attending: Primary Care | Admitting: Primary Care

## 2021-08-08 DIAGNOSIS — R112 Nausea with vomiting, unspecified: Secondary | ICD-10-CM | POA: Diagnosis not present

## 2021-08-08 DIAGNOSIS — G4452 New daily persistent headache (NDPH): Secondary | ICD-10-CM | POA: Diagnosis present

## 2021-08-08 DIAGNOSIS — G43709 Chronic migraine without aura, not intractable, without status migrainosus: Secondary | ICD-10-CM | POA: Diagnosis present

## 2021-08-13 LAB — TSH

## 2021-08-13 LAB — SPECIMEN STATUS REPORT

## 2021-09-05 ENCOUNTER — Other Ambulatory Visit (HOSPITAL_COMMUNITY): Payer: Self-pay

## 2021-09-13 ENCOUNTER — Telehealth: Payer: No Typology Code available for payment source | Admitting: Primary Care

## 2021-09-13 ENCOUNTER — Other Ambulatory Visit: Payer: Self-pay

## 2021-10-16 ENCOUNTER — Ambulatory Visit (INDEPENDENT_AMBULATORY_CARE_PROVIDER_SITE_OTHER): Payer: No Typology Code available for payment source | Admitting: Clinical

## 2021-10-16 DIAGNOSIS — F411 Generalized anxiety disorder: Secondary | ICD-10-CM

## 2021-10-18 ENCOUNTER — Other Ambulatory Visit: Payer: Self-pay

## 2021-10-18 ENCOUNTER — Ambulatory Visit (INDEPENDENT_AMBULATORY_CARE_PROVIDER_SITE_OTHER): Payer: No Typology Code available for payment source | Admitting: Clinical

## 2021-10-18 DIAGNOSIS — F411 Generalized anxiety disorder: Secondary | ICD-10-CM | POA: Diagnosis not present

## 2021-10-31 ENCOUNTER — Ambulatory Visit: Payer: No Typology Code available for payment source | Admitting: Clinical

## 2021-11-28 ENCOUNTER — Ambulatory Visit: Payer: No Typology Code available for payment source | Admitting: Clinical

## 2021-11-29 ENCOUNTER — Encounter: Payer: No Typology Code available for payment source | Admitting: Primary Care

## 2021-12-06 ENCOUNTER — Other Ambulatory Visit: Payer: Self-pay

## 2021-12-06 ENCOUNTER — Encounter (HOSPITAL_COMMUNITY): Payer: Self-pay | Admitting: *Deleted

## 2021-12-06 ENCOUNTER — Observation Stay (HOSPITAL_COMMUNITY)
Admission: EM | Admit: 2021-12-06 | Discharge: 2021-12-08 | Disposition: A | Discharge status: Home / Self Care | Payer: No Typology Code available for payment source | Attending: General Surgery | Admitting: General Surgery

## 2021-12-06 DIAGNOSIS — L02419 Cutaneous abscess of limb, unspecified: Secondary | ICD-10-CM | POA: Diagnosis present

## 2021-12-06 DIAGNOSIS — Z800 Family history of malignant neoplasm of digestive organs: Secondary | ICD-10-CM | POA: Diagnosis not present

## 2021-12-06 DIAGNOSIS — L02411 Cutaneous abscess of right axilla: Secondary | ICD-10-CM | POA: Diagnosis present

## 2021-12-06 DIAGNOSIS — R202 Paresthesia of skin: Secondary | ICD-10-CM | POA: Insufficient documentation

## 2021-12-06 DIAGNOSIS — Z9889 Other specified postprocedural states: Secondary | ICD-10-CM

## 2021-12-06 DIAGNOSIS — L03111 Cellulitis of right axilla: Principal | ICD-10-CM | POA: Insufficient documentation

## 2021-12-06 DIAGNOSIS — L0291 Cutaneous abscess, unspecified: Secondary | ICD-10-CM

## 2021-12-06 DIAGNOSIS — Z20822 Contact with and (suspected) exposure to covid-19: Secondary | ICD-10-CM | POA: Diagnosis not present

## 2021-12-06 DIAGNOSIS — Z791 Long term (current) use of non-steroidal anti-inflammatories (NSAID): Secondary | ICD-10-CM | POA: Diagnosis not present

## 2021-12-06 LAB — CK: Total CK: 83 U/L (ref 38–234)

## 2021-12-06 LAB — CBC WITH DIFFERENTIAL/PLATELET
Abs Immature Granulocytes: 0.01 10*3/uL (ref 0.00–0.07)
Basophils Absolute: 0 10*3/uL (ref 0.0–0.1)
Basophils Relative: 0 %
Eosinophils Absolute: 0 10*3/uL (ref 0.0–0.5)
Eosinophils Relative: 0 %
HCT: 43.1 % (ref 36.0–46.0)
Hemoglobin: 15 g/dL (ref 12.0–15.0)
Immature Granulocytes: 0 %
Lymphocytes Relative: 29 %
Lymphs Abs: 2.5 10*3/uL (ref 0.7–4.0)
MCH: 32.1 pg (ref 26.0–34.0)
MCHC: 34.8 g/dL (ref 30.0–36.0)
MCV: 92.3 fL (ref 80.0–100.0)
Monocytes Absolute: 0.5 10*3/uL (ref 0.1–1.0)
Monocytes Relative: 6 %
Neutro Abs: 5.7 10*3/uL (ref 1.7–7.7)
Neutrophils Relative %: 65 %
Platelets: 267 10*3/uL (ref 150–400)
RBC: 4.67 MIL/uL (ref 3.87–5.11)
RDW: 12.4 % (ref 11.5–15.5)
WBC: 8.8 10*3/uL (ref 4.0–10.5)
nRBC: 0 % (ref 0.0–0.2)

## 2021-12-06 LAB — COMPREHENSIVE METABOLIC PANEL
ALT: 19 U/L (ref 0–44)
AST: 22 U/L (ref 15–41)
Albumin: 4 g/dL (ref 3.5–5.0)
Alkaline Phosphatase: 44 U/L (ref 38–126)
Anion gap: 4 — ABNORMAL LOW (ref 5–15)
BUN: 11 mg/dL (ref 6–20)
CO2: 24 mmol/L (ref 22–32)
Calcium: 8.6 mg/dL — ABNORMAL LOW (ref 8.9–10.3)
Chloride: 105 mmol/L (ref 98–111)
Creatinine, Ser: 0.82 mg/dL (ref 0.44–1.00)
GFR, Estimated: >60 mL/min (ref >60)
Glucose, Bld: 100 mg/dL — ABNORMAL HIGH (ref 70–99)
Potassium: 3.6 mmol/L (ref 3.5–5.1)
Sodium: 133 mmol/L — ABNORMAL LOW (ref 135–145)
Total Bilirubin: 0.5 mg/dL (ref 0.3–1.2)
Total Protein: 7.7 g/dL (ref 6.5–8.1)

## 2021-12-06 LAB — I-STAT BETA HCG BLOOD, ED (MC, WL, AP ONLY): I-stat hCG, quantitative: <5 m[IU]/mL (ref <5)

## 2021-12-06 LAB — LACTIC ACID, PLASMA: Lactic Acid, Venous: 1 mmol/L (ref 0.5–1.9)

## 2021-12-06 NOTE — ED Provider Triage Note (Signed)
Emergency Medicine Provider Triage Evaluation Note  Stephanie [name] , a 26 y.o. female  was evaluated in triage.  Pt complains of abscess since January 5th.  She reports that the pain in her arm and chest started Saturday.  She reports that swelling started today.  She hasn't been on antibiotics.  She states that she has never had pain or swelling like this before.  She feels globally unwell.  Review of Systems  Positive: See above.  Negative:   Physical Exam  BP 117/73 (BP Location: Left Arm)    Pulse (!) 109    Temp 99.7 F (37.6 C) (Oral)    Resp 20    SpO2 98%  Gen:   Awake, anxious and tearful Resp:  Normal effort  MSK:   Pain with any movements of the right arm.  Right shoulder is swollen as his right arm. She is able to move the fingers on her right hand however has decreased strength. Other:  Subjective decrease sensation to light touch across the right entire arm.  She has 2+ right radial pulse.  There is erythema extending onto the right sided chest and the right sided breast.    Is mildly diaphoretic.  Medical Decision Making  Medically screening exam initiated at 9:17 PM.  Appropriate orders placed.  AMYRIAH GARTRELL was informed that the remainder of the evaluation will be completed by another provider, this initial triage assessment does not replace that evaluation, and the importance of remaining in the ED until their evaluation is complete.  Patient presents today for evaluation of abscess in January.  Here she is tachycardic and nearly febrile with a temp of 99.7.  She is slightly diaphoretic.  Given the associated swelling she is having, along with decreased sensation to light touch in the right arm and subjective weakness I am concerned that she may have abscess or spread of infection, neurovascular bundles versus myositis.  She does not clearly meet sepsis criteria at this time therefore code sepsis was not activated. Lactic, blood cultures, and labs  ordered.     Lorin Glass, [place] 12/06/21 [id number]

## 2021-12-06 NOTE — ED Triage Notes (Signed)
Abscess to the right axilla area, reports now her right shoulder and arm feel swollen and has redness around the area. Denies drainage. (Noted splotchy redness to the right arm/chest and extends to the breast). Hx of hidradenitis purvitis

## 2021-12-07 ENCOUNTER — Encounter (HOSPITAL_COMMUNITY): Payer: Self-pay | Admitting: *Deleted

## 2021-12-07 ENCOUNTER — Emergency Department (HOSPITAL_COMMUNITY): Payer: No Typology Code available for payment source

## 2021-12-07 ENCOUNTER — Encounter (HOSPITAL_COMMUNITY)
Admission: EM | Disposition: A | Discharge status: Home / Self Care | Payer: Self-pay | Source: Home / Self Care | Attending: Emergency Medicine

## 2021-12-07 ENCOUNTER — Emergency Department (HOSPITAL_COMMUNITY): Payer: No Typology Code available for payment source | Admitting: Anesthesiology

## 2021-12-07 DIAGNOSIS — L02411 Cutaneous abscess of right axilla: Secondary | ICD-10-CM | POA: Diagnosis present

## 2021-12-07 DIAGNOSIS — L02419 Cutaneous abscess of limb, unspecified: Secondary | ICD-10-CM | POA: Diagnosis present

## 2021-12-07 HISTORY — PX: IRRIGATION AND DEBRIDEMENT ABSCESS: SHX5252

## 2021-12-07 LAB — RESP PANEL BY RT-PCR (FLU A&B, COVID) ARPGX2
Influenza A by PCR: NEGATIVE
Influenza B by PCR: NEGATIVE
SARS Coronavirus 2 by RT PCR: NEGATIVE

## 2021-12-07 LAB — LACTIC ACID, PLASMA: Lactic Acid, Venous: 1.4 mmol/L (ref 0.5–1.9)

## 2021-12-07 SURGERY — IRRIGATION AND DEBRIDEMENT ABSCESS
Anesthesia: General | Site: Axilla | Laterality: Right

## 2021-12-07 MED ORDER — DIPHENHYDRAMINE HCL 25 MG PO CAPS
25.0000 mg | ORAL_CAPSULE | Freq: Four times a day (QID) | ORAL | Status: DC | PRN
  Administered 2021-12-08: 25 mg via ORAL
  Filled 2021-12-07: qty 1

## 2021-12-07 MED ORDER — LIDOCAINE 2% (20 MG/ML) 5 ML SYRINGE
INTRAMUSCULAR | Status: DC | PRN
  Administered 2021-12-07: 60 mg via INTRAVENOUS

## 2021-12-07 MED ORDER — HYDROMORPHONE HCL 1 MG/ML IJ SOLN: 0.5000 mg | INTRAMUSCULAR | Status: DC | PRN

## 2021-12-07 MED ORDER — OXYCODONE HCL 5 MG PO TABS: 5.0000 mg | ORAL_TABLET | Freq: Once | ORAL | Status: DC | PRN

## 2021-12-07 MED ORDER — CHLORHEXIDINE GLUCONATE 0.12 % MT SOLN: 15.0000 mL | Freq: Once | OROMUCOSAL | Status: AC

## 2021-12-07 MED ORDER — ONDANSETRON HCL 4 MG/2ML IJ SOLN
4.0000 mg | Freq: Four times a day (QID) | INTRAMUSCULAR | Status: DC | PRN
  Filled 2021-12-07: qty 2

## 2021-12-07 MED ORDER — LIDOCAINE 4 % EX CREA
TOPICAL_CREAM | Freq: Once | CUTANEOUS | Status: AC
  Administered 2021-12-07: 1 via TOPICAL
  Filled 2021-12-07: qty 5

## 2021-12-07 MED ORDER — IOHEXOL 300 MG/ML  SOLN
75.0000 mL | Freq: Once | INTRAMUSCULAR | Status: AC | PRN
  Administered 2021-12-07: 75 mL via INTRAVENOUS

## 2021-12-07 MED ORDER — SCOPOLAMINE 1 MG/3DAYS TD PT72
MEDICATED_PATCH | TRANSDERMAL | Status: AC
  Administered 2021-12-07: 1.5 mg via TRANSDERMAL
  Filled 2021-12-07: qty 1

## 2021-12-07 MED ORDER — MIDAZOLAM HCL 2 MG/2ML IJ SOLN
INTRAMUSCULAR | Status: DC | PRN
  Administered 2021-12-07: 2 mg via INTRAVENOUS

## 2021-12-07 MED ORDER — AMISULPRIDE (ANTIEMETIC) 5 MG/2ML IV SOLN: 10.0000 mg | Freq: Once | INTRAVENOUS | Status: DC | PRN

## 2021-12-07 MED ORDER — HYDROMORPHONE HCL 1 MG/ML IJ SOLN
1.0000 mg | Freq: Once | INTRAMUSCULAR | Status: AC
  Administered 2021-12-07: 1 mg via INTRAVENOUS
  Filled 2021-12-07: qty 1

## 2021-12-07 MED ORDER — ONDANSETRON HCL 4 MG/2ML IJ SOLN
4.0000 mg | Freq: Three times a day (TID) | INTRAMUSCULAR | Status: DC | PRN
  Administered 2021-12-07: 4 mg via INTRAVENOUS
  Filled 2021-12-07: qty 2

## 2021-12-07 MED ORDER — LACTATED RINGERS IV BOLUS
1000.0000 mL | Freq: Once | INTRAVENOUS | Status: AC
  Administered 2021-12-07: 1000 mL via INTRAVENOUS

## 2021-12-07 MED ORDER — OXYCODONE HCL 5 MG/5ML PO SOLN: 5.0000 mg | Freq: Once | ORAL | Status: DC | PRN

## 2021-12-07 MED ORDER — 0.9 % SODIUM CHLORIDE (POUR BTL) OPTIME
TOPICAL | Status: DC | PRN
  Administered 2021-12-07: 1000 mL

## 2021-12-07 MED ORDER — FENTANYL CITRATE (PF) 100 MCG/2ML IJ SOLN
25.0000 ug | INTRAMUSCULAR | Status: DC | PRN
  Administered 2021-12-07: 50 ug via INTRAVENOUS
  Administered 2021-12-07 (×2): 25 ug via INTRAVENOUS

## 2021-12-07 MED ORDER — ACETAMINOPHEN 500 MG PO TABS: 1000.0000 mg | ORAL_TABLET | Freq: Once | ORAL | Status: AC

## 2021-12-07 MED ORDER — DEXMEDETOMIDINE (PRECEDEX) IN NS 20 MCG/5ML (4 MCG/ML) IV SYRINGE
PREFILLED_SYRINGE | INTRAVENOUS | Status: DC | PRN
  Administered 2021-12-07: 8 ug via INTRAVENOUS

## 2021-12-07 MED ORDER — CLINDAMYCIN PHOSPHATE 600 MG/50ML IV SOLN
600.0000 mg | INTRAVENOUS | Status: AC
  Administered 2021-12-07: 600 mg via INTRAVENOUS

## 2021-12-07 MED ORDER — MIDAZOLAM HCL 2 MG/2ML IJ SOLN
INTRAMUSCULAR | Status: AC
  Filled 2021-12-07: qty 2

## 2021-12-07 MED ORDER — METOPROLOL TARTRATE 5 MG/5ML IV SOLN: 5.0000 mg | Freq: Four times a day (QID) | INTRAVENOUS | Status: DC | PRN

## 2021-12-07 MED ORDER — DEXAMETHASONE SODIUM PHOSPHATE 10 MG/ML IJ SOLN
INTRAMUSCULAR | Status: DC | PRN
Start: 2021-12-07 — End: 2021-12-07
  Administered 2021-12-07: 10 mg via INTRAVENOUS

## 2021-12-07 MED ORDER — ACETAMINOPHEN 500 MG PO TABS
ORAL_TABLET | ORAL | Status: AC
  Administered 2021-12-07: 1000 mg via ORAL
  Filled 2021-12-07: qty 2

## 2021-12-07 MED ORDER — CHLORHEXIDINE GLUCONATE 0.12 % MT SOLN
OROMUCOSAL | Status: AC
  Administered 2021-12-07: 15 mL via OROMUCOSAL
  Filled 2021-12-07: qty 15

## 2021-12-07 MED ORDER — LIDOCAINE-EPINEPHRINE (PF) 2 %-1:200000 IJ SOLN
20.0000 mL | Freq: Once | INTRAMUSCULAR | Status: AC
  Administered 2021-12-07: 20 mL
  Filled 2021-12-07: qty 20

## 2021-12-07 MED ORDER — CHLORHEXIDINE GLUCONATE CLOTH 2 % EX PADS: 6.0000 | MEDICATED_PAD | Freq: Once | CUTANEOUS | Status: DC

## 2021-12-07 MED ORDER — SIMETHICONE 80 MG PO CHEW: 40.0000 mg | CHEWABLE_TABLET | Freq: Four times a day (QID) | ORAL | Status: DC | PRN

## 2021-12-07 MED ORDER — FENTANYL CITRATE (PF) 250 MCG/5ML IJ SOLN
INTRAMUSCULAR | Status: DC | PRN
  Administered 2021-12-07 (×2): 50 ug via INTRAVENOUS

## 2021-12-07 MED ORDER — LIDOCAINE 2% (20 MG/ML) 5 ML SYRINGE
INTRAMUSCULAR | Status: AC
  Filled 2021-12-07: qty 10

## 2021-12-07 MED ORDER — SCOPOLAMINE 1 MG/3DAYS TD PT72: 1.0000 | MEDICATED_PATCH | TRANSDERMAL | Status: DC

## 2021-12-07 MED ORDER — ONDANSETRON 4 MG PO TBDP: 4.0000 mg | ORAL_TABLET | Freq: Four times a day (QID) | ORAL | Status: DC | PRN

## 2021-12-07 MED ORDER — ENOXAPARIN SODIUM 40 MG/0.4ML IJ SOSY
40.0000 mg | PREFILLED_SYRINGE | INTRAMUSCULAR | Status: DC
  Filled 2021-12-07: qty 0.4

## 2021-12-07 MED ORDER — DEXAMETHASONE SODIUM PHOSPHATE 10 MG/ML IJ SOLN
INTRAMUSCULAR | Status: AC
  Filled 2021-12-07: qty 1

## 2021-12-07 MED ORDER — PROPOFOL 10 MG/ML IV BOLUS
INTRAVENOUS | Status: DC | PRN
  Administered 2021-12-07: 150 mg via INTRAVENOUS

## 2021-12-07 MED ORDER — POLYETHYLENE GLYCOL 3350 17 G PO PACK: 17.0000 g | PACK | Freq: Every day | ORAL | Status: DC | PRN

## 2021-12-07 MED ORDER — SACCHAROMYCES BOULARDII 250 MG PO CAPS
250.0000 mg | ORAL_CAPSULE | Freq: Two times a day (BID) | ORAL | Status: DC
  Administered 2021-12-07 – 2021-12-08 (×3): 250 mg via ORAL
  Filled 2021-12-07 (×3): qty 1

## 2021-12-07 MED ORDER — PHENYLEPHRINE 40 MCG/ML (10ML) SYRINGE FOR IV PUSH (FOR BLOOD PRESSURE SUPPORT)
PREFILLED_SYRINGE | INTRAVENOUS | Status: AC
  Filled 2021-12-07: qty 20

## 2021-12-07 MED ORDER — DIPHENHYDRAMINE HCL 50 MG/ML IJ SOLN: 25.0000 mg | Freq: Four times a day (QID) | INTRAMUSCULAR | Status: DC | PRN

## 2021-12-07 MED ORDER — CLINDAMYCIN PHOSPHATE 600 MG/50ML IV SOLN
600.0000 mg | Freq: Once | INTRAVENOUS | Status: AC
Start: 2021-12-07 — End: 2021-12-07
  Administered 2021-12-07: 600 mg via INTRAVENOUS
  Filled 2021-12-07: qty 50

## 2021-12-07 MED ORDER — CLINDAMYCIN PHOSPHATE 600 MG/50ML IV SOLN
600.0000 mg | Freq: Three times a day (TID) | INTRAVENOUS | Status: DC
  Administered 2021-12-07 – 2021-12-08 (×2): 600 mg via INTRAVENOUS
  Filled 2021-12-07 (×2): qty 50

## 2021-12-07 MED ORDER — ONDANSETRON HCL 4 MG/2ML IJ SOLN
INTRAMUSCULAR | Status: DC | PRN
Start: 2021-12-07 — End: 2021-12-07
  Administered 2021-12-07: 4 mg via INTRAVENOUS

## 2021-12-07 MED ORDER — LACTATED RINGERS IV SOLN: INTRAVENOUS | Status: DC

## 2021-12-07 MED ORDER — OXYCODONE HCL 5 MG PO TABS
5.0000 mg | ORAL_TABLET | ORAL | Status: DC | PRN
  Administered 2021-12-07 – 2021-12-08 (×3): 10 mg via ORAL
  Filled 2021-12-07: qty 2
  Filled 2021-12-07: qty 1
  Filled 2021-12-07: qty 2
  Filled 2021-12-07: qty 1

## 2021-12-07 MED ORDER — EPHEDRINE 5 MG/ML INJ
INTRAVENOUS | Status: AC
  Filled 2021-12-07: qty 10

## 2021-12-07 MED ORDER — LIDOCAINE-PRILOCAINE 2.5-2.5 % EX CREA
TOPICAL_CREAM | Freq: Once | CUTANEOUS | Status: DC
Start: 2021-12-07 — End: 2021-12-07
  Filled 2021-12-07: qty 5

## 2021-12-07 MED ORDER — CLINDAMYCIN PHOSPHATE 600 MG/50ML IV SOLN
INTRAVENOUS | Status: AC
  Filled 2021-12-07: qty 50

## 2021-12-07 MED ORDER — FENTANYL CITRATE (PF) 100 MCG/2ML IJ SOLN
INTRAMUSCULAR | Status: AC
  Filled 2021-12-07: qty 2

## 2021-12-07 MED ORDER — DOCUSATE SODIUM 100 MG PO CAPS
100.0000 mg | ORAL_CAPSULE | Freq: Two times a day (BID) | ORAL | Status: DC
  Administered 2021-12-08: 100 mg via ORAL
  Filled 2021-12-07 (×2): qty 1

## 2021-12-07 MED ORDER — PROMETHAZINE HCL 25 MG/ML IJ SOLN: 6.2500 mg | INTRAMUSCULAR | Status: DC | PRN

## 2021-12-07 MED ORDER — DEXMEDETOMIDINE (PRECEDEX) IN NS 20 MCG/5ML (4 MCG/ML) IV SYRINGE
PREFILLED_SYRINGE | INTRAVENOUS | Status: AC
  Filled 2021-12-07: qty 10

## 2021-12-07 MED ORDER — ACETAMINOPHEN 500 MG PO TABS
1000.0000 mg | ORAL_TABLET | Freq: Four times a day (QID) | ORAL | Status: DC
  Administered 2021-12-07 – 2021-12-08 (×3): 1000 mg via ORAL
  Filled 2021-12-07 (×3): qty 2

## 2021-12-07 MED ORDER — ONDANSETRON HCL 4 MG/2ML IJ SOLN
INTRAMUSCULAR | Status: AC
  Filled 2021-12-07: qty 2

## 2021-12-07 MED ORDER — TRAMADOL HCL 50 MG PO TABS
50.0000 mg | ORAL_TABLET | Freq: Four times a day (QID) | ORAL | Status: DC | PRN
  Administered 2021-12-08 (×2): 50 mg via ORAL
  Filled 2021-12-07 (×2): qty 1

## 2021-12-07 MED ORDER — ORAL CARE MOUTH RINSE: 15.0000 mL | Freq: Once | OROMUCOSAL | Status: AC

## 2021-12-07 MED ORDER — PROPOFOL 10 MG/ML IV BOLUS
INTRAVENOUS | Status: AC
  Filled 2021-12-07: qty 20

## 2021-12-07 MED ORDER — PHENYLEPHRINE 40 MCG/ML (10ML) SYRINGE FOR IV PUSH (FOR BLOOD PRESSURE SUPPORT)
PREFILLED_SYRINGE | INTRAVENOUS | Status: DC | PRN
Start: 2021-12-07 — End: 2021-12-07
  Administered 2021-12-07: 40 ug via INTRAVENOUS

## 2021-12-07 MED ORDER — FENTANYL CITRATE (PF) 250 MCG/5ML IJ SOLN
INTRAMUSCULAR | Status: AC
  Filled 2021-12-07: qty 5

## 2021-12-07 SURGICAL SUPPLY — 31 items
BAG COUNTER SPONGE SURGICOUNT (BAG) ×2 IMPLANT
BNDG GAUZE ELAST 4 BULKY (GAUZE/BANDAGES/DRESSINGS) IMPLANT
CANISTER SUCT 3000ML PPV (MISCELLANEOUS) ×2 IMPLANT
COVER SURGICAL LIGHT HANDLE (MISCELLANEOUS) ×2 IMPLANT
DRAPE LAPAROSCOPIC ABDOMINAL (DRAPES) IMPLANT
DRAPE LAPAROTOMY 100X72 PEDS (DRAPES) ×1 IMPLANT
DRSG PAD ABDOMINAL 8X10 ST (GAUZE/BANDAGES/DRESSINGS) IMPLANT
ELECT REM PT RETURN 9FT ADLT (ELECTROSURGICAL) ×2
ELECTRODE REM PT RTRN 9FT ADLT (ELECTROSURGICAL) ×1 IMPLANT
GAUZE PACKING IODOFORM 1/4X15 (PACKING) ×1 IMPLANT
GAUZE SPONGE 4X4 12PLY STRL (GAUZE/BANDAGES/DRESSINGS) ×1 IMPLANT
GLOVE SRG 8 PF TXTR STRL LF DI (GLOVE) ×1 IMPLANT
GLOVE SURG SYN 7.5  E (GLOVE) ×2
GLOVE SURG SYN 7.5 E (GLOVE) ×1 IMPLANT
GLOVE SURG SYN 7.5 PF PI (GLOVE) ×1 IMPLANT
GLOVE SURG UNDER POLY LF SZ8 (GLOVE) ×2
GOWN STRL REUS W/ TWL LRG LVL3 (GOWN DISPOSABLE) ×2 IMPLANT
GOWN STRL REUS W/ TWL XL LVL3 (GOWN DISPOSABLE) ×1 IMPLANT
GOWN STRL REUS W/TWL LRG LVL3 (GOWN DISPOSABLE) ×4
GOWN STRL REUS W/TWL XL LVL3 (GOWN DISPOSABLE) ×2
KIT BASIN OR (CUSTOM PROCEDURE TRAY) ×2 IMPLANT
KIT TURNOVER KIT B (KITS) ×2 IMPLANT
NS IRRIG 1000ML POUR BTL (IV SOLUTION) ×2 IMPLANT
PACK GENERAL/GYN (CUSTOM PROCEDURE TRAY) ×2 IMPLANT
PAD ARMBOARD 7.5X6 YLW CONV (MISCELLANEOUS) ×2 IMPLANT
PENCIL SMOKE EVACUATOR (MISCELLANEOUS) ×1 IMPLANT
SWAB COLLECTION DEVICE MRSA (MISCELLANEOUS) ×1 IMPLANT
SWAB CULTURE ESWAB REG 1ML (MISCELLANEOUS) ×2 IMPLANT
TAPE CLOTH SURG 4X10 WHT LF (GAUZE/BANDAGES/DRESSINGS) ×1 IMPLANT
TOWEL GREEN STERILE (TOWEL DISPOSABLE) ×2 IMPLANT
TOWEL GREEN STERILE FF (TOWEL DISPOSABLE) ×2 IMPLANT

## 2021-12-07 NOTE — Anesthesia Preprocedure Evaluation (Addendum)
Anesthesia Evaluation  Patient identified by MRN, date of birth, ID band Patient awake    Reviewed: Allergy & Precautions, NPO status , Patient's Chart, lab work & pertinent test results  Airway Mallampati: II  TM Distance: >3 FB Neck ROM: Full    Dental no notable dental hx. (+) Teeth Intact, Dental Advisory Given   Pulmonary neg pulmonary ROS,    Pulmonary exam normal breath sounds clear to auscultation       Cardiovascular Exercise Tolerance: Good negative cardio ROS Normal cardiovascular exam Rhythm:Regular Rate:Normal     Neuro/Psych  Headaches, Anxiety Depression    GI/Hepatic negative GI ROS, Neg liver ROS,   Endo/Other  negative endocrine ROS  Renal/GU negative Renal ROS  negative genitourinary   Musculoskeletal negative musculoskeletal ROS (+)   Abdominal   Peds  Hematology negative hematology ROS (+)   Anesthesia Other Findings Axillary hidradentitis  Reproductive/Obstetrics negative OB ROS                             Anesthesia Physical Anesthesia Plan  ASA: 2  Anesthesia Plan: General   Post-op Pain Management: Tylenol PO (pre-op)   Induction: Intravenous  PONV Risk Score and Plan: 3 and Scopolamine patch - Pre-op, Ondansetron, Treatment may vary due to age or medical condition, Midazolam and Dexamethasone  Airway Management Planned: LMA  Additional Equipment: None  Intra-op Plan:   Post-operative Plan:   Informed Consent: I have reviewed the patients History and Physical, chart, labs and discussed the procedure including the risks, benefits and alternatives for the proposed anesthesia with the patient or authorized representative who has indicated his/her understanding and acceptance.     Dental advisory given  Plan Discussed with: CRNA, Anesthesiologist and Surgeon  Anesthesia Plan Comments:         Anesthesia Quick Evaluation

## 2021-12-07 NOTE — ED Notes (Signed)
Pt transported to CT ?

## 2021-12-07 NOTE — ED Notes (Signed)
Lidocaine at bedside.

## 2021-12-07 NOTE — ED Notes (Signed)
Pocketbook given to aunt by patient

## 2021-12-07 NOTE — Anesthesia Procedure Notes (Signed)
Procedure Name: LMA Insertion Date/Time: 12/07/2021 9:56 AM Performed by: Lorie Phenix, CRNA Pre-anesthesia Checklist: Patient identified, Emergency Drugs available, Suction available and Patient being monitored Patient Re-evaluated:Patient Re-evaluated prior to induction Oxygen Delivery Method: Circle System Utilized Preoxygenation: Pre-oxygenation with 100% oxygen Induction Type: IV induction Ventilation: Mask ventilation without difficulty LMA: LMA inserted LMA Size: 4.0 Number of attempts: 1 Placement Confirmation: positive ETCO2 Tube secured with: Tape Dental Injury: Teeth and Oropharynx as per pre-operative assessment

## 2021-12-07 NOTE — ED Provider Notes (Signed)
[facility] EMERGENCY DEPARTMENT Provider Note   CSN: [id number] Arrival date & time: 12/06/21  2059     History  Chief Complaint  Patient presents with   Abscess    Stephanie [name] is a 26 y.o. female.  19 yo F  w/ right axillary pain, swelling and reported spreading redness to chest wall and anterior shoulder. Also states it hurts to make a fist and difficult to do so as well. Has Hidradenitis but has not had these types of symptoms previously. No fever. Feels unwell.    Abscess     Home Medications Prior to Admission medications   Medication Sig Start Date End Date Taking? Authorizing Provider  acetaminophen (TYLENOL) 500 MG tablet Take 1,000 mg by mouth every 6 (six) hours as needed.   Yes [provider]  ibuprofen (ADVIL) 200 MG tablet Take 400-600 mg by mouth every 6 (six) hours as needed for headache or moderate pain.   Yes [provider]  escitalopram (LEXAPRO) 5 MG tablet Take 1 tablet (5 mg total) by mouth daily. Patient not taking: Reported on 12/07/2021 08/02/21   Doreene Nest, NP  etonogestrel (NEXPLANON) 68 MG IMPL implant 1 each by Subdermal route continuous. 09/30/18   [provider]  ibuprofen (ADVIL) 600 MG tablet TAKE 1 TABLET (600 MG TOTAL) BY MOUTH EVERY 8 (EIGHT) HOURS AS NEEDED. Patient not taking: Reported on 12/07/2021 01/02/21 01/02/22  Lorre Munroe, NP  naproxen (NAPROSYN) 500 MG tablet Take 1 tablet (500 mg total) by mouth 2 (two) times daily with a meal. As needed for menstrual cramping. Patient not taking: Reported on 12/07/2021 05/25/21   Anyanwu, Jethro Bastos, MD  ondansetron (ZOFRAN ODT) 4 MG disintegrating tablet Take 1-2 tablets (4-8 mg total) by mouth every 8 (eight) hours as needed for nausea or vomiting. Patient not taking: Reported on 12/07/2021 05/25/21   Tereso Newcomer, MD  scopolamine (TRANSDERM-SCOP) 1 MG/3DAYS Place 1 patch (1.5 mg total) onto the skin every 3 (three) days. Patient  not taking: Reported on 12/07/2021 05/25/21   Tereso Newcomer, MD  tranexamic acid (LYSTEDA) 650 MG TABS tablet Take 2 tablets (1,300 mg total) by mouth 3 (three) times daily. TAKE DURING MENSES FOR A MAXIMUM OF FIVE DAYS Patient not taking: Reported on 06/26/2021 05/25/21   Tereso Newcomer, MD  dicyclomine (BENTYL) 20 MG tablet Take 1 tablet (20 mg total) by mouth 2 (two) times daily. 10/26/19 05/17/20  Joy, Shawn C, PA-C  famotidine (PEPCID) 20 MG tablet Take 1 tablet (20 mg total) by mouth 2 (two) times daily. 02/07/20 05/17/20  Henderly, Britni A, PA-C  metoCLOPramide (REGLAN) 10 MG tablet Take 1 tablet (10 mg total) by mouth every 6 (six) hours as needed for nausea or vomiting. Patient not taking: Reported on 09/19/2019 06/19/19 09/19/19  Caccavale, Sophia, PA-C  pantoprazole (PROTONIX) 40 MG tablet Take 40 mg twice a day for 8 weeks 03/04/20 05/17/20  Cirigliano, Verlin Dike, DO  promethazine (PHENERGAN) 25 MG suppository Place 1 suppository (25 mg total) rectally every 6 (six) hours as needed for nausea or vomiting. Patient not taking: Reported on 03/04/2020 02/07/20 05/17/20  Henderly, Britni A, PA-C  sucralfate (CARAFATE) 1 GM/10ML suspension Take 10 mLs (1 g total) by mouth 4 (four) times daily -  with meals and at bedtime. 02/07/20 05/17/20  Henderly, Britni A, PA-C      Allergies    Almond (diagnostic), Coconut flavor, and Septra [sulfamethoxazole-trimethoprim]    Review of  Systems   Review of Systems  Physical Exam Updated Vital Signs BP 133/71    Pulse (!) 121    Temp 99.1 F (37.3 C) (Oral)    Resp 16    SpO2 100%  Physical Exam Vitals and nursing note reviewed.  Constitutional:      Appearance: She is well-developed.  HENT:     Head: Normocephalic and atraumatic.     Nose: Nose normal. No congestion or rhinorrhea.     Mouth/Throat:     Mouth: Mucous membranes are moist.     Pharynx: Oropharynx is clear.  Eyes:     Pupils: Pupils are equal, round, and reactive to light.  Cardiovascular:      Rate and Rhythm: Normal rate and regular rhythm.  Pulmonary:     Effort: Pulmonary effort is normal. No respiratory distress.     Breath sounds: No stridor.  Abdominal:     General: Abdomen is flat. There is no distension.  Musculoskeletal:        General: No swelling or tenderness. Normal range of motion.     Cervical back: Normal range of motion.  Skin:    General: Skin is warm and dry.     Findings: Erythema (and associated ttp to right axillary area. no obvious fluctuance or abscess) present.  Neurological:     General: No focal deficit present.     Mental Status: She is alert.    ED Results / Procedures / Treatments   Labs (all labs ordered are listed, but only abnormal results are displayed) Labs Reviewed  COMPREHENSIVE METABOLIC PANEL - Abnormal; Notable for the following components:      Result Value   Sodium 133 (*)    Glucose, Bld 100 (*)    Calcium 8.6 (*)    Anion gap 4 (*)    All other components within normal limits  CULTURE, BLOOD (ROUTINE X 2)  CULTURE, BLOOD (ROUTINE X 2)  CBC WITH DIFFERENTIAL/PLATELET  LACTIC ACID, PLASMA  LACTIC ACID, PLASMA  CK  I-STAT BETA HCG BLOOD, ED ([facility], [facility], [facility] ONLY)    EKG None  Radiology CT Chest W Contrast  Result Date: 12/07/2021 CLINICAL DATA:  Abscess to the right axilla. EXAM: CT CHEST WITH CONTRAST TECHNIQUE: Multidetector CT imaging of the chest was performed during intravenous contrast administration. RADIATION DOSE REDUCTION: This exam was performed according to the departmental dose-optimization program which includes automated exposure control, adjustment of the mA and/or kV according to patient size and/or use of iterative reconstruction technique. CONTRAST:  51mL OMNIPAQUE IOHEXOL 300 MG/ML  SOLN COMPARISON:  None. FINDINGS: Cardiovascular: No significant vascular findings. Normal heart size. No pericardial effusion. Mediastinum/Nodes: No enlarged mediastinal, hilar, or axillary lymph nodes. A 1.3 cm lymph node  is seen along the lateral aspect of the mid to upper right chest wall (axial CT image 59, CT series 3). Thyroid gland, trachea, and esophagus demonstrate no significant findings. Lungs/Pleura: Lungs are clear. No pleural effusion or pneumothorax. Upper Abdomen: No acute abnormality. Musculoskeletal: A 4.3 cm x 1.4 cm x 2.8 cm collection of fluid and air density, with thick surrounding mildly hyperdense wall, is seen within the subcutaneous fat of the right axilla (axial CT image 49, CT series 3/sagittal reformatted image 1, CT series 7). A mild amount of adjacent inflammatory fat stranding is seen. IMPRESSION: 1. 4.3 cm x 1.4 cm x 2.8 cm abscess within the subcutaneous fat of the right axilla. 2. No acute or active cardiopulmonary disease. Electronically Signed  By: Aram Candelahaddeus  Houston M.D.   On: 12/07/2021 02:19    Procedures Procedures    Medications Ordered in ED Medications  ondansetron (ZOFRAN) injection 4 mg (4 mg Intravenous Given 12/07/21 0250)  lidocaine-EPINEPHrine (XYLOCAINE W/EPI) 2 %-1:200000 (PF) injection 20 mL (has no administration in time range)  clindamycin (CLEOCIN) IVPB 600 mg (0 mg Intravenous Stopped 12/07/21 0257)  HYDROmorphone (DILAUDID) injection 1 mg (1 mg Intravenous Given 12/07/21 0131)  lactated ringers bolus 1,000 mL (0 mLs Intravenous Stopped 12/07/21 0348)  iohexol (OMNIPAQUE) 300 MG/ML solution 75 mL (75 mLs Intravenous Contrast Given 12/07/21 0212)  lidocaine (LMX) 4 % cream (1 application Topical Given 12/07/21 0421)  HYDROmorphone (DILAUDID) injection 1 mg (1 mg Intravenous Given 12/07/21 0445)    ED Course/ Medical Decision Making/ A&P                           Medical Decision Making Amount and/or Complexity of Data Reviewed Radiology: ordered.  Risk OTC drugs. Prescription drug management.  Concern for cellulitis. May or may not have abscess, will ct to eval for same. Abx/pain meds initiated.  Ct with moderate sized abscess. I attempted I&D without  improvement or purulence found. 2/2 amount of discomfort, I consulted surgery. Pending recommendations.  EGS consulted, possibly I&D in wrong area, but difficult to tell. 2/2 amount of pain, plan to take to OR later today for better visualization and ultimate management.    Final Clinical Impression(s) / ED Diagnoses Final diagnoses:  Cellulitis of right axilla  Abscess    Rx / DC Orders ED Discharge Orders     None         Demichael Traum, Barbara CowerJason, MD 12/07/21 [phone]

## 2021-12-07 NOTE — Plan of Care (Signed)

## 2021-12-07 NOTE — H&P (Signed)
Stephanie [name] Jul 21, 1996  [id number].    Requesting MD: Dr. Dayna Barker Chief Complaint/Reason for Consult: axillary abscess  HPI:  Stephanie [name] is a 26 yo female with a history of hidradenitis who presented to the ED overnight with worsening pain under the right arm. She has previously had boils in the right axilla from her hidradenitis, and says that about a week ago she noticed a new lesion in the area. Since then she has developed swelling and pain, which has progressed down her right arm and to her chest wall. She has never had symptoms this severe before from her hidradenitis. She also reports some numbness in her hand, and has difficulty moving her arm due to the pain. She has not had fevers and her WBC is 8. A CT scan was done and showed an abscess in the right axilla. Bedside I&D was attempted in the ED but no drainage was identified. General surgery was consulted.  ROS: Review of Systems  Constitutional:  Negative for chills and fever.  Respiratory:  Negative for shortness of breath and wheezing.   Cardiovascular:  Negative for chest pain.  Gastrointestinal:  Negative for abdominal pain, nausea and vomiting.  Genitourinary:  Negative for dysuria.  Musculoskeletal:        Right arm pain  Neurological:  Negative for seizures and weakness.   Family History  Problem Relation Age of Onset   Depression Mother    Migraines Mother    Irritable bowel syndrome Mother    Hypertension Mother    Migraines Father    Diabetes Father    Diabetes Maternal Grandmother    Hypertension Maternal Grandmother    Diabetes Sister    Stomach cancer Sister 66   Colon cancer Neg Hx    Esophageal cancer Neg Hx    Rectal cancer Neg Hx     Past Medical History:  Diagnosis Date   Acute pancreatitis without infection or necrosis 04/21/2019   Anemia    Anxiety 01/28/2013   no meds   Depression    no meds   IBS (irritable bowel syndrome)    Migraines    HA every other day - otc med prn    Recurrent streptococcal tonsillitis    Seasonal allergies    Seasonal environmental allergies   Sebaceous cyst 08/04/2018   L mid back    Vision abnormalities    Myopia; patient has history of devloping ulcers when she wore contacts.    Past Surgical History:  Procedure Laterality Date   COLONOSCOPY     IBS   COLONOSCOPY     IBS   LAPAROSCOPY N/A 10/20/2018   Procedure: LAPAROSCOPY DIAGNOSTIC possible removal of endometriosis;  Surgeon: Linda Hedges, DO;  Location: [facility];  Service: Gynecology;  Laterality: N/A;   UPPER GASTROINTESTINAL ENDOSCOPY  03/04/2020    Social History:  reports that she has never smoked. She has never used smokeless tobacco. She reports current alcohol use. She reports current drug use. Drug: Marijuana.  Allergies:  Allergies  Allergen Reactions   Almond (Diagnostic) Other (See Comments)    [place]   Coconut Flavor Other (See Comments)    Mouth itches, but no impaired breathing (per patient   Septra [Sulfamethoxazole-Trimethoprim] Nausea Only and Other (See Comments)    Patient took a few courses of this and it ended up being stopped by a MD because it had started causing "bad stomach pain" (per the patient)    (Not in a hospital admission)  Physical Exam: Blood pressure 120/74, pulse 89, temperature 99.1 F (37.3 C), temperature source Oral, resp. rate 17, SpO2 100 %. General: resting comfortably, appears stated age, no apparent distress Neurological: alert and oriented, no focal deficits, cranial nerves grossly in tact HEENT: normocephalic, atraumatic, oropharynx clear, no scleral icterus CV: mild tachycardia 110s, regular rhythm, extremities warm and well-perfused Respiratory: normal work of breathing, symmetric chest wall expansion. Extremities: warm and well-perfused. Induration with exquisite tenderness to palpation in the right axilla, I&D site is clean with no purulent drainage. Active ROM of right arm is limited by pain. Psychiatric:  normal mood and affect Skin: warm and dry, no jaundice, no rashes or lesions   Results for orders placed or performed during the hospital encounter of 12/06/21 (from the past 48 hour(s))  CBC with Differential     Status: None   Collection Time: 12/06/21  9:45 PM  Result Value Ref Range   WBC 8.8 4.0 - 10.5 K/uL   RBC 4.67 3.87 - 5.11 MIL/uL   Hemoglobin 15.0 12.0 - 15.0 g/dL   HCT 43.1 36.0 - 46.0 %   MCV 92.3 80.0 - 100.0 fL   MCH 32.1 26.0 - 34.0 pg   MCHC 34.8 30.0 - 36.0 g/dL   RDW 12.4 11.5 - 15.5 %   Platelets 267 150 - 400 K/uL   nRBC 0.0 0.0 - 0.2 %   Neutrophils Relative % 65 %   Neutro Abs 5.7 1.7 - 7.7 K/uL   Lymphocytes Relative 29 %   Lymphs Abs 2.5 0.7 - 4.0 K/uL   Monocytes Relative 6 %   Monocytes Absolute 0.5 0.1 - 1.0 K/uL   Eosinophils Relative 0 %   Eosinophils Absolute 0.0 0.0 - 0.5 K/uL   Basophils Relative 0 %   Basophils Absolute 0.0 0.0 - 0.1 K/uL   Immature Granulocytes 0 %   Abs Immature Granulocytes 0.01 0.00 - 0.07 K/uL    Comment: Performed at [facility], [address] [place], [place] [address]  Comprehensive metabolic panel     Status: Abnormal   Collection Time: 12/06/21  9:45 PM  Result Value Ref Range   Sodium 133 (L) 135 - 145 mmol/L   Potassium 3.6 3.5 - 5.1 mmol/L   Chloride 105 98 - 111 mmol/L   CO2 24 22 - 32 mmol/L   Glucose, Bld 100 (H) 70 - 99 mg/dL    Comment: Glucose reference range applies only to samples taken after fasting for at least 8 hours.   BUN 11 6 - 20 mg/dL   Creatinine, Ser 0.82 0.44 - 1.00 mg/dL   Calcium 8.6 (L) 8.9 - 10.3 mg/dL   Total Protein 7.7 6.5 - 8.1 g/dL   Albumin 4.0 3.5 - 5.0 g/dL   AST 22 15 - 41 U/L   ALT 19 0 - 44 U/L   Alkaline Phosphatase 44 38 - 126 U/L   Total Bilirubin 0.5 0.3 - 1.2 mg/dL   GFR, Estimated >60 >60 mL/min    Comment: (NOTE) Calculated using the CKD-EPI Creatinine Equation (2021)    Anion gap 4 (L) 5 - 15    Comment: Performed at [facility] [address] [place], [place] [address]  Lactic acid, plasma     Status: None   Collection Time: 12/06/21  9:45 PM  Result Value Ref Range   Lactic Acid, Venous 1.0 0.5 - 1.9 mmol/L    Comment: Performed at [facility], [address] [place], [place] [address]  CK     Status: None   Collection Time: 12/06/21  9:45 PM  Result Value Ref Range   Total CK 83 38 - 234 U/L    Comment: Performed at [facility], [place] [address] [place], [place] [address]  I-Stat beta hCG blood, ED     Status: None   Collection Time: 12/06/21 10:13 PM  Result Value Ref Range   I-stat hCG, quantitative <5.0 <5 mIU/mL   Comment 3            Comment:   GEST. AGE      CONC.  (mIU/mL)   <=1 WEEK        5 - 50     2 WEEKS       50 - 500     3 WEEKS       100 - 10,000     4 WEEKS     1,000 - 30,000        FEMALE AND NON-PREGNANT FEMALE:     LESS THAN 5 mIU/mL   Lactic acid, plasma     Status: None   Collection Time: 12/06/21 11:23 PM  Result Value Ref Range   Lactic Acid, Venous 1.4 0.5 - 1.9 mmol/L    Comment: Performed at [facility], [place] [address], [place], [place] [address]   CT Chest W Contrast  Result Date: 12/07/2021 CLINICAL DATA:  Abscess to the right axilla. EXAM: CT CHEST WITH CONTRAST TECHNIQUE: Multidetector CT imaging of the chest was performed during intravenous contrast administration. RADIATION DOSE REDUCTION: This exam was performed according to the departmental dose-optimization program which includes automated exposure control, adjustment of the mA and/or kV according to patient size and/or use of iterative reconstruction technique. CONTRAST:  2mL OMNIPAQUE IOHEXOL 300 MG/ML  SOLN COMPARISON:  None. FINDINGS: Cardiovascular: No significant vascular findings. Normal heart size. No pericardial effusion. Mediastinum/Nodes: No enlarged mediastinal, hilar, or axillary lymph nodes. A 1.3 cm lymph node is seen along the lateral aspect of the mid to upper right chest wall (axial CT image  59, CT series 3). Thyroid gland, trachea, and esophagus demonstrate no significant findings. Lungs/Pleura: Lungs are clear. No pleural effusion or pneumothorax. Upper Abdomen: No acute abnormality. Musculoskeletal: A 4.3 cm x 1.4 cm x 2.8 cm collection of fluid and air density, with thick surrounding mildly hyperdense wall, is seen within the subcutaneous fat of the right axilla (axial CT image 49, CT series 3/sagittal reformatted image 1, CT series 7). A mild amount of adjacent inflammatory fat stranding is seen. IMPRESSION: 1. 4.3 cm x 1.4 cm x 2.8 cm abscess within the subcutaneous fat of the right axilla. 2. No acute or active cardiopulmonary disease. Electronically Signed   By: Virgina Norfolk M.D.   On: 12/07/2021 02:19      Assessment/Plan 26 yo female with a history of axillary hidradenitis, presenting with a large abscess in the right axilla. Bedside I&D did not yield any drainage and patient is extremely uncomfortable on exam, so we will proceed to the OR for incision and drainage under sedation. - Keep NPO - Clindamycin given in ED - Patient will go to OR this morning with Dr. Rosendo Gros for incision and drainage. Possible discharge home following the procedure.   Michaelle Birks, MD [facility] Surgery General, Hepatobiliary and Pancreatic Surgery 12/07/21 6:49 AM

## 2021-12-07 NOTE — Op Note (Signed)
12/07/2021  10:12 AM  PATIENT:  Stephanie [name]  26 y.o. female  PRE-OPERATIVE DIAGNOSIS:  abscess  POST-OPERATIVE DIAGNOSIS:  right axillary abscess  PROCEDURE:  Procedure(s): IRRIGATION AND DEBRIDEMENT AXILLARY ABSCESS (N/A) 5x4cm incision site  SURGEON:  Surgeon(s) and Role:    Ralene Ok, MD - Primary  ANESTHESIA:   general  EBL:  minimal   BLOOD ADMINISTERED:none  DRAINS: none   LOCAL MEDICATIONS USED:  NONE  SPECIMEN:  Source of Specimen:  right axillary abscess  DISPOSITION OF SPECIMEN:  micro  COUNTS:  YES  TOURNIQUET:  * No tourniquets in log *  DICTATION: .Dragon Dictation Findings: Pt had a 5x4cm abscess cavity.  Cx's were obtained. The area was packed with iodoform guaze.  Details of the procedure: After the patient was consented, she was taken back to the OR and placed in the supine position with bilateral SCDs in place.  Pt was placed in GETA.  She was prepped and draped in the standard fashion. A timeout was called and all facts were verified.  An elliptical incision was made of the area of abscess.  Skin was excised.  A large amount of pus was expressed.  Cultures were obtained from the abscess cavity.  All loculations were broken up.  The pocket was shallow.  It was irrigated out with sterile saline.  Cautery was used to obtain hemostatsis.  1/4" iodoform guaze was placed into the wound bed and dressed with 4x4s and tape.  The patient tolerated the procedure well and was taken to the PACU in stable condition.    PLAN OF CARE: Admit for overnight observation  PATIENT DISPOSITION:  PACU - hemodynamically stable.   Delay start of Pharmacological VTE agent (>24hrs) due to surgical blood loss or risk of bleeding: no

## 2021-12-07 NOTE — Anesthesia Postprocedure Evaluation (Signed)
Anesthesia Post Note  Patient: Stephanie [name]  Procedure(s) Performed: IRRIGATION AND DEBRIDEMENT AXILLARY ABSCESS (Right: Axilla)     Patient location during evaluation: PACU Anesthesia Type: General Level of consciousness: sedated Pain management: pain level controlled Vital Signs Assessment: post-procedure vital signs reviewed and stable Respiratory status: spontaneous breathing and respiratory function stable Cardiovascular status: stable Postop Assessment: no apparent nausea or vomiting Anesthetic complications: no   No notable events documented.  Last Vitals:  Vitals:   12/07/21 1139 12/07/21 1209  BP: 122/65 110/76  Pulse: 94 87  Resp: 13 16  Temp:    SpO2: 100% 100%    Last Pain:  Vitals:   12/07/21 1209  TempSrc:   PainSc: 4                  Candra R [name]

## 2021-12-07 NOTE — Transfer of Care (Signed)
Immediate Anesthesia Transfer of Care Note  Patient: Stephanie [name]  Procedure(s) Performed: IRRIGATION AND DEBRIDEMENT AXILLARY ABSCESS (Right: Axilla)  Patient Location: PACU  Anesthesia Type:General  Level of Consciousness: drowsy  Airway & Oxygen Therapy: Patient Spontanous Breathing and Patient connected to face mask oxygen  Post-op Assessment: Report given to RN and Post -op Vital signs reviewed and stable  Post vital signs: Reviewed and stable  Last Vitals:  Vitals Value Taken Time  BP 105/62 12/07/21 1025  Temp 36.3 C 12/07/21 1025  Pulse 86 12/07/21 1027  Resp 14 12/07/21 1027  SpO2 100 % 12/07/21 1027  Vitals shown include unvalidated device data.  Last Pain:  Vitals:   12/07/21 0922  TempSrc:   PainSc: 8       Patients Stated Pain Goal: 3 (12/07/21 9811)  Complications: No notable events documented.

## 2021-12-08 ENCOUNTER — Other Ambulatory Visit (HOSPITAL_COMMUNITY): Payer: Self-pay

## 2021-12-08 ENCOUNTER — Encounter (HOSPITAL_COMMUNITY): Payer: Self-pay | Admitting: General Surgery

## 2021-12-08 DIAGNOSIS — Z9889 Other specified postprocedural states: Secondary | ICD-10-CM

## 2021-12-08 MED ORDER — DOXYCYCLINE HYCLATE 50 MG PO CAPS
50.0000 mg | ORAL_CAPSULE | Freq: Two times a day (BID) | ORAL | 0 refills | Status: AC
  Filled 2021-12-08: qty 10, 5d supply, fill #0

## 2021-12-08 MED ORDER — SACCHAROMYCES BOULARDII 250 MG PO CAPS: 250.0000 mg | ORAL_CAPSULE | Freq: Two times a day (BID) | ORAL | Status: DC

## 2021-12-08 MED ORDER — DOCUSATE SODIUM 100 MG PO CAPS: 100.0000 mg | ORAL_CAPSULE | Freq: Two times a day (BID) | ORAL | Status: DC | PRN

## 2021-12-08 MED ORDER — TRAMADOL HCL 50 MG PO TABS
50.0000 mg | ORAL_TABLET | Freq: Four times a day (QID) | ORAL | 0 refills | Status: AC | PRN
  Filled 2021-12-08: qty 20, 5d supply, fill #0

## 2021-12-08 NOTE — Discharge Summary (Signed)
[facility] Discharge Summary   Patient ID: Stephanie [name] MRN: [id number] DOB/AGE: Mar 04, 1996 26 y.o.  Admit date: 12/06/2021 Discharge date: 12/08/2021  Admitting Diagnosis: Abscess [L02.91] Cellulitis of right axilla [L03.111] Axillary abscess [L02.419]   Discharge Diagnosis Patient Active Problem List   Diagnosis Date Noted   History of incision and drainage 12/08/2021   Axillary abscess 12/07/2021   Headache, new daily persistent (NDPH) 08/02/2021   Breast mass in female 12/22/2020   Dysmenorrhea 08/30/2020   Flank pain 07/10/2019   Near syncope 06/05/2019   Nausea and vomiting 04/20/2019   Axillary hidradenitis suppurativa 03/05/2019   Recurrent infection of skin 02/04/2019   Menorrhagia with regular cycle 09/24/2014   Migraines 08/04/2014   Allergic rhinitis 04/03/2013   MDD (major depressive disorder), single episode, severe (HCC) 01/28/2013   Generalized anxiety disorder 01/28/2013     Consultants None  Imaging: CT Chest W Contrast  Result Date: 12/07/2021 CLINICAL DATA:  Abscess to the right axilla. EXAM: CT CHEST WITH CONTRAST TECHNIQUE: Multidetector CT imaging of the chest was performed during intravenous contrast administration. RADIATION DOSE REDUCTION: This exam was performed according to the departmental dose-optimization program which includes automated exposure control, adjustment of the mA and/or kV according to patient size and/or use of iterative reconstruction technique. CONTRAST:  63mL OMNIPAQUE IOHEXOL 300 MG/ML  SOLN COMPARISON:  None. FINDINGS: Cardiovascular: No significant vascular findings. Normal heart size. No pericardial effusion. Mediastinum/Nodes: No enlarged mediastinal, hilar, or axillary lymph nodes. A 1.3 cm lymph node is seen along the lateral aspect of the mid to upper right chest wall (axial CT image 59, CT series 3). Thyroid gland, trachea, and esophagus demonstrate no significant findings. Lungs/Pleura: Lungs are  clear. No pleural effusion or pneumothorax. Upper Abdomen: No acute abnormality. Musculoskeletal: A 4.3 cm x 1.4 cm x 2.8 cm collection of fluid and air density, with thick surrounding mildly hyperdense wall, is seen within the subcutaneous fat of the right axilla (axial CT image 49, CT series 3/sagittal reformatted image 1, CT series 7). A mild amount of adjacent inflammatory fat stranding is seen. IMPRESSION: 1. 4.3 cm x 1.4 cm x 2.8 cm abscess within the subcutaneous fat of the right axilla. 2. No acute or active cardiopulmonary disease. Electronically Signed   By: Aram Candela M.D.   On: 12/07/2021 02:19    Procedures Dr. Derrell Lolling (12/07/21) - irrigation and debridement axillary abscess  Hospital Course:  26 year old female who presented to [facility] ED with worsening pain under the right arm and associated numbness in her right hand.  Workup showed right axillary abscess. I&D performed in the ED but no drainage identified.  Patient was admitted and underwent procedure listed above.  Tolerated procedure well and was transferred to the floor.  Diet was advanced as tolerated.  On POD1, the patient was voiding well, tolerating diet, ambulating well, pain well controlled, vital signs stable, incision c/d/i and felt stable for discharge home. Packing was removed from wound and due to shallow depth not repacked. She will complete 5 day course of antibiotics. Patient will follow up in our office in 2-3 weeks and knows to call with questions or concerns. Wound care instructions and return precautions discussed at length with patient and all questions answered. Recommend she follow up with PCP.  Physical Exam: General:  Alert, NAD, pleasant, comfortable Pulm: normal work of breathing on room air CV: no cyanosis Abd:  Soft, nontender MSK: no peripheral edema. MAE. Right hand and arm with ROM  limited secondary to pain Skin: warm and dry. Right axillary abscess - packing removed. Improving inflammation  around incision. Minimal drainage. Appropriate scant bleeding  I or a member of my team have reviewed this patient in the Controlled Substance Database.  I spent 30 minutes in the care of patient including coordination of discharge.  Allergies as of 12/08/2021       Reactions   Almond (diagnostic) Other (See Comments)   unkmnown   Coconut Flavor Other (See Comments)   Mouth itches, but no impaired breathing (per patient   Septra [sulfamethoxazole-trimethoprim] Nausea Only, Other (See Comments)   Patient took a few courses of this and it ended up being stopped by a MD because it had started causing "bad stomach pain" (per the patient)        Medication List     STOP taking these medications    naproxen 500 MG tablet Commonly known as: NAPROSYN       TAKE these medications    acetaminophen 500 MG tablet Commonly known as: TYLENOL Take 1,000 mg by mouth every 6 (six) hours as needed.   docusate sodium 100 MG capsule Commonly known as: COLACE Take 1 capsule (100 mg total) by mouth 2 (two) times daily as needed for mild constipation or moderate constipation.   doxycycline 50 MG capsule Commonly known as: VIBRAMYCIN Take 1 capsule (50 mg total) by mouth 2 (two) times daily for 5 days.   escitalopram 5 MG tablet Commonly known as: Lexapro Take 1 tablet (5 mg total) by mouth daily.   etonogestrel 68 MG Impl implant Commonly known as: NEXPLANON 1 each by Subdermal route continuous.   ibuprofen 200 MG tablet Commonly known as: ADVIL Take 400-600 mg by mouth every 6 (six) hours as needed for headache or moderate pain. What changed: Another medication with the same name was removed. Continue taking this medication, and follow the directions you see here.   ondansetron 4 MG disintegrating tablet Commonly known as: Zofran ODT Take 1-2 tablets (4-8 mg total) by mouth every 8 (eight) hours as needed for nausea or vomiting.   saccharomyces boulardii 250 MG capsule Commonly  known as: FLORASTOR Take 1 capsule (250 mg total) by mouth 2 (two) times daily. While taking antibiotic   scopolamine 1 MG/3DAYS Commonly known as: TRANSDERM-SCOP Place 1 patch (1.5 mg total) onto the skin every 3 (three) days.   traMADol 50 MG tablet Commonly known as: ULTRAM Take 1 tablet (50 mg total) by mouth every 6 (six) hours as needed for up to 5 days.   tranexamic acid 650 MG Tabs tablet Commonly known as: LYSTEDA Take 2 tablets (1,300 mg total) by mouth 3 (three) times daily. TAKE DURING MENSES FOR A MAXIMUM OF FIVE DAYS          Follow-up Information     Doreene Nest, NP Follow up.   Specialty: Internal Medicine Contact information: [address] [place] [address] [phone]         Surgery, [place]. Go on 12/26/2021.   Specialty: General Surgery Why: follow up appointment on 2/7 at 10:15 am. Please arrive 30 min early to complete check in process and bring photo ID and insurance card if you have them Contact information: [address] [place] [address] [phone]                 Signed: Eric Form , [facility] 12/08/2021, 8:59 AM Please see Amion  for pager number during day hours 7:00am-4:30pm

## 2021-12-08 NOTE — Discharge Instructions (Addendum)
You do not need to pack your wound. You can use gauze/bandages to cover wound and protect clothing. Discharge and some bleeding from wound is normal. You are clear to shower and clean wound with warm water. Do not scrub. Continue to use tylenol and ibuprofen for help with pain control and ice as needed with barrier over skin. If your numbness and tingling does not improve please follow up with your primary care provider or call our office.  WHEN TO CALL YOUR DOCTOR: Fever over 101.0 Increasing bleeding from incision. Increased pain, redness, or drainage from the incision.  The clinic staff is available to answer your questions during regular business hours.  Please dont hesitate to call and ask to speak to one of the nurses for clinical concerns.  If you have a medical emergency, go to the nearest emergency room or call 911.  A surgeon from [facility] is always on call at the hospital. [address] [place], [place], [place]  [address] ? [address] [place], [place], [place]   [address] [phone] ? [phone] ? FAX [phone] Web site: [email or web address]

## 2021-12-11 LAB — CULTURE, BLOOD (ROUTINE X 2)
Culture: NO GROWTH
Culture: NO GROWTH
Special Requests: ADEQUATE
Special Requests: ADEQUATE

## 2021-12-12 ENCOUNTER — Ambulatory Visit: Payer: No Typology Code available for payment source | Admitting: Clinical

## 2021-12-12 ENCOUNTER — Ambulatory Visit (INDEPENDENT_AMBULATORY_CARE_PROVIDER_SITE_OTHER): Payer: No Typology Code available for payment source | Admitting: Nurse Practitioner

## 2021-12-12 ENCOUNTER — Ambulatory Visit: Payer: No Typology Code available for payment source | Admitting: Nurse Practitioner

## 2021-12-12 ENCOUNTER — Other Ambulatory Visit: Payer: Self-pay

## 2021-12-12 VITALS — BP 126/78 | HR 88 | Temp 98.9°F | Resp 12 | Ht 63.0 in | Wt 129.1 lb

## 2021-12-12 DIAGNOSIS — R202 Paresthesia of skin: Secondary | ICD-10-CM | POA: Insufficient documentation

## 2021-12-12 DIAGNOSIS — L02419 Cutaneous abscess of limb, unspecified: Secondary | ICD-10-CM | POA: Diagnosis not present

## 2021-12-12 LAB — AEROBIC/ANAEROBIC CULTURE W GRAM STAIN (SURGICAL/DEEP WOUND)

## 2021-12-12 NOTE — Patient Instructions (Signed)
Nice to see you today Continue changing the dressing how the surgeons instructed Follow up with them as scheduled If you do not continue to improve or things change or get worse, follow back up in office with me or Jae Dire

## 2021-12-12 NOTE — Progress Notes (Signed)
Established Patient Office Visit  Subjective:  Patient ID: Stephanie [name], female    DOB: 1996-01-07  Age: 26 y.o. MRN: [id number]  CC:  Chief Complaint  Patient presents with   Hospitalization Follow-up    On axillary abscess right side. Has follow up with surgeon on 12/26/21. Has been having pain. Only takes pain medication at night time. Tylenol helps a little maybe. Still has numb/tingling in her right hand and some sweeling    HPI Stephanie [name] presents for Hospital follow up  States that the abscess came around 11/26/2021. States it went away and then came back with lots of pain and was getting worse. Went to ED 12/06/2021. Attempted I&D in ED but was not able to. They consulted surgery who took patient to the OR. Went to OR on 12/07/2021 and then was kept and dishcarged on 12/08/2021. They did wound culture and placed her on the appropriate oral abx. States she has approx 1 day left on those  States she changes the dressing twice a day once in the morning and then after her shower. With guaze and kerlix. She does have a follow up visit with [facility] on 12/26/2021  Past Medical History:  Diagnosis Date   Acute pancreatitis without infection or necrosis 04/21/2019   Anemia    Anxiety 01/28/2013   no meds   Depression    no meds   IBS (irritable bowel syndrome)    Migraines    HA every other day - otc med prn   Recurrent streptococcal tonsillitis    Seasonal allergies    Seasonal environmental allergies   Sebaceous cyst 08/04/2018   L mid back    Vision abnormalities    Myopia; patient has history of devloping ulcers when she wore contacts.    Past Surgical History:  Procedure Laterality Date   COLONOSCOPY     IBS   COLONOSCOPY     IBS   IRRIGATION AND DEBRIDEMENT ABSCESS Right 12/07/2021   Procedure: IRRIGATION AND DEBRIDEMENT AXILLARY ABSCESS;  Surgeon: Ralene Ok, MD;  Location: [facility];  Service: General;  Laterality: Right;    LAPAROSCOPY N/A 10/20/2018   Procedure: LAPAROSCOPY DIAGNOSTIC possible removal of endometriosis;  Surgeon: Linda Hedges, DO;  Location: [facility];  Service: Gynecology;  Laterality: N/A;   UPPER GASTROINTESTINAL ENDOSCOPY  03/04/2020    Family History  Problem Relation Age of Onset   Depression Mother    Migraines Mother    Irritable bowel syndrome Mother    Hypertension Mother    Migraines Father    Diabetes Father    Diabetes Maternal Grandmother    Hypertension Maternal Grandmother    Diabetes Sister    Stomach cancer Sister 66   Colon cancer Neg Hx    Esophageal cancer Neg Hx    Rectal cancer Neg Hx     Social History   Socioeconomic History   Marital status: Single    Spouse name: Not on file   Number of children: 0   Years of education: Not on file   Highest education level: Not on file  Occupational History   Occupation: Nanny  Tobacco Use   Smoking status: Never   Smokeless tobacco: Never  Vaping Use   Vaping Use: Never used  Substance and Sexual Activity   Alcohol use: Yes    Comment: socially   Drug use: Yes    Types: Marijuana    Comment: Last use Thursday 10/16/18   Sexual activity:  Not Currently    Partners: Male    Birth control/protection: Implant    Comment: broke up with first sexual partner 1 month ago  Other Topics Concern   Not on file  Social History Narrative   Single.    Student at [employer].   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    Enjoys listening to music, hanging out with friends.    Social Determinants of Health   Financial Resource Strain: Not on file  Food Insecurity: Not on file  Transportation Needs: Not on file  Physical Activity: Not on file  Stress: Not on file  Social Connections: Not on file  Intimate Partner Violence: Not on file    Outpatient Medications Prior to Visit  Medication Sig Dispense Refill   acetaminophen (TYLENOL) 500 MG tablet Take 1,000 mg by mouth every 6 (six) hours as needed.      doxycycline (VIBRAMYCIN) 50 MG capsule Take 1 capsule (50 mg total) by mouth 2 (two) times daily for 5 days. 10 capsule 0   escitalopram (LEXAPRO) 5 MG tablet Take 1 tablet (5 mg total) by mouth daily. 30 tablet 1   etonogestrel (NEXPLANON) 68 MG IMPL implant 1 each by Subdermal route continuous.     ibuprofen (ADVIL) 200 MG tablet Take 400-600 mg by mouth every 6 (six) hours as needed for headache or moderate pain.     ondansetron (ZOFRAN ODT) 4 MG disintegrating tablet Take 1-2 tablets (4-8 mg total) by mouth every 8 (eight) hours as needed for nausea or vomiting. 30 tablet 0   traMADol (ULTRAM) 50 MG tablet Take 1 tablet (50 mg total) by mouth every 6 (six) hours as needed for up to 5 days. 20 tablet 0   docusate sodium (COLACE) 100 MG capsule Take 1 capsule (100 mg total) by mouth 2 (two) times daily as needed for mild constipation or moderate constipation.     saccharomyces boulardii (FLORASTOR) 250 MG capsule Take 1 capsule (250 mg total) by mouth 2 (two) times daily. While taking antibiotic     scopolamine (TRANSDERM-SCOP) 1 MG/3DAYS Place 1 patch (1.5 mg total) onto the skin every 3 (three) days. (Patient not taking: Reported on 12/07/2021) 4 patch 12   tranexamic acid (LYSTEDA) 650 MG TABS tablet Take 2 tablets (1,300 mg total) by mouth 3 (three) times daily. TAKE DURING MENSES FOR A MAXIMUM OF FIVE DAYS (Patient not taking: Reported on 06/26/2021) 30 tablet 1   Facility-Administered Medications Prior to Visit  Medication Dose Route Frequency Provider Last Rate Last Admin   0.9 %  sodium chloride infusion  500 mL Intravenous Once Cirigliano, Vito V, DO        Allergies  Allergen Reactions   Almond (Diagnostic) Other (See Comments)    unkmnown   Coconut Flavor Other (See Comments)    Mouth itches, but no impaired breathing (per patient   Septra [Sulfamethoxazole-Trimethoprim] Nausea Only and Other (See Comments)    Patient took a few courses of this and it ended up being stopped by a MD  because it had started causing "bad stomach pain" (per the patient)    ROS Review of Systems  Constitutional:  Negative for chills and fever (felt warm per patients boyfriend).  Respiratory:  Negative for cough and shortness of breath.   Cardiovascular:  Negative for chest pain.  Gastrointestinal:  Positive for diarrhea (3 x yesterday.) and nausea. Negative for vomiting.  Skin:  Positive for wound.  Neurological:  Positive for weakness and numbness.  Objective:    Physical Exam Vitals and nursing note reviewed. Exam conducted with a chaperone present [employer], RMA).  Constitutional:      Appearance: Normal appearance.  Cardiovascular:     Rate and Rhythm: Normal rate and regular rhythm.     Pulses: Normal pulses.     Heart sounds: Normal heart sounds.  Pulmonary:     Effort: Pulmonary effort is normal.     Breath sounds: Normal breath sounds.  Abdominal:     General: Bowel sounds are normal.  Skin:    Comments: Approx 3 in incision to right axilla. Healing well. Scant discharge on old gauze with dressing change   Neurological:     Mental Status: She is alert.     Deep Tendon Reflexes:     Reflex Scores:      Bicep reflexes are 2+ on the left side.    Comments: Bilateral upper extremity strength 5/5. Inhibited by pain     BP 126/78    Pulse 88    Temp 98.9 F (37.2 C)    Resp 12    Ht 5' 3"  (1.6 m)    Wt 129 lb 2 oz (58.6 kg)    LMP 11/15/2021    SpO2 99%    BMI 22.87 kg/m  Wt Readings from Last 3 Encounters:  12/12/21 129 lb 2 oz (58.6 kg)  12/07/21 123 lb (55.8 kg)  08/02/21 126 lb (57.2 kg)     Health Maintenance Due  Topic Date Due   HPV VACCINES (1 - 2-dose series) Never done   Hepatitis C Screening  Never done   TETANUS/TDAP  02/19/2018   PAP-Cervical Cytology Screening  02/11/2021   PAP SMEAR-Modifier  02/11/2021       Topic Date Due   HPV VACCINES (1 - 2-dose series) Never done    Lab Results  Component Value Date   TSH CANCELED  08/02/2021   Lab Results  Component Value Date   WBC 8.8 12/06/2021   HGB 15.0 12/06/2021   HCT 43.1 12/06/2021   MCV 92.3 12/06/2021   PLT 267 12/06/2021   Lab Results  Component Value Date   NA 133 (L) 12/06/2021   K 3.6 12/06/2021   CO2 24 12/06/2021   GLUCOSE 100 (H) 12/06/2021   BUN 11 12/06/2021   CREATININE 0.82 12/06/2021   BILITOT 0.5 12/06/2021   ALKPHOS 44 12/06/2021   AST 22 12/06/2021   ALT 19 12/06/2021   PROT 7.7 12/06/2021   ALBUMIN 4.0 12/06/2021   CALCIUM 8.6 (L) 12/06/2021   ANIONGAP 4 (L) 12/06/2021   EGFR 88 08/02/2021   GFR 103.51 06/05/2019   Lab Results  Component Value Date   CHOL 132 11/20/2017   Lab Results  Component Value Date   HDL 39.50 11/20/2017   Lab Results  Component Value Date   LDLCALC 83 11/20/2017   Lab Results  Component Value Date   TRIG 48.0 11/20/2017   Lab Results  Component Value Date   CHOLHDL 3 11/20/2017   No results found for: HGBA1C    Assessment & Plan:   Problem List Items Addressed This Visit       Other   Axillary abscess - Primary    Patient presents for hospital follow-up doing with an axillary abscess.  Patient is doing decently well still having increased pain and paresthesias to right arm.  She does have pain medication at home and states it makes her sleepy and she is unable  to take it daily.  She is changing her dressings twice daily as recommended from surgical group.  Did unwrap and rewrap after taking a look at wound in office.      Paresthesia    Paresthesia to right upper extremity.  States has been that way since being seen prior to surgery in the hospital.  Patient also has some subjective weakness that has improved per her report.  Do think given the location and size of abscess could be some inflammation against nerve bundle in the axilla.  She is to continue to monitor if no improvement by the time she sees surgery for follow-up to let them know.       No orders of the defined  types were placed in this encounter.   Follow-up: No follow-ups on file.   This visit occurred during the SARS-CoV-2 public health emergency.  Safety protocols were in place, including screening questions prior to the visit, additional usage of staff PPE, and extensive cleaning of exam room while observing appropriate contact time as indicated for disinfecting solutions.   Romilda Garret, NP

## 2021-12-12 NOTE — Assessment & Plan Note (Signed)
Patient presents for hospital follow-up doing with an axillary abscess.  Patient is doing decently well still having increased pain and paresthesias to right arm.  She does have pain medication at home and states it makes her sleepy and she is unable to take it daily.  She is changing her dressings twice daily as recommended from surgical group.  Did unwrap and rewrap after taking a look at wound in office.

## 2021-12-12 NOTE — Assessment & Plan Note (Signed)
Paresthesia to right upper extremity.  States has been that way since being seen prior to surgery in the hospital.  Patient also has some subjective weakness that has improved per her report.  Do think given the location and size of abscess could be some inflammation against nerve bundle in the axilla.  She is to continue to monitor if no improvement by the time she sees surgery for follow-up to let them know.

## 2021-12-19 ENCOUNTER — Ambulatory Visit: Payer: No Typology Code available for payment source | Admitting: Clinical

## 2021-12-28 ENCOUNTER — Encounter (HOSPITAL_COMMUNITY): Payer: Self-pay | Admitting: *Deleted

## 2021-12-28 ENCOUNTER — Telehealth: Payer: Self-pay

## 2021-12-28 ENCOUNTER — Other Ambulatory Visit: Payer: Self-pay

## 2021-12-28 ENCOUNTER — Emergency Department (HOSPITAL_COMMUNITY)
Admission: EM | Admit: 2021-12-28 | Discharge: 2021-12-28 | Disposition: A | Discharge status: Home / Self Care | Payer: No Typology Code available for payment source | Attending: Student | Admitting: Student

## 2021-12-28 DIAGNOSIS — L02411 Cutaneous abscess of right axilla: Secondary | ICD-10-CM | POA: Diagnosis present

## 2021-12-28 DIAGNOSIS — R112 Nausea with vomiting, unspecified: Secondary | ICD-10-CM | POA: Insufficient documentation

## 2021-12-28 DIAGNOSIS — Z5321 Procedure and treatment not carried out due to patient leaving prior to being seen by health care provider: Secondary | ICD-10-CM | POA: Insufficient documentation

## 2021-12-28 DIAGNOSIS — R197 Diarrhea, unspecified: Secondary | ICD-10-CM | POA: Diagnosis not present

## 2021-12-28 LAB — CBC WITH DIFFERENTIAL/PLATELET
Abs Immature Granulocytes: 0.01 10*3/uL (ref 0.00–0.07)
Basophils Absolute: 0 10*3/uL (ref 0.0–0.1)
Basophils Relative: 1 %
Eosinophils Absolute: 0 10*3/uL (ref 0.0–0.5)
Eosinophils Relative: 1 %
HCT: 45.2 % (ref 36.0–46.0)
Hemoglobin: 15.5 g/dL — ABNORMAL HIGH (ref 12.0–15.0)
Immature Granulocytes: 0 %
Lymphocytes Relative: 34 %
Lymphs Abs: 1.5 10*3/uL (ref 0.7–4.0)
MCH: 31.8 pg (ref 26.0–34.0)
MCHC: 34.3 g/dL (ref 30.0–36.0)
MCV: 92.8 fL (ref 80.0–100.0)
Monocytes Absolute: 0.2 10*3/uL (ref 0.1–1.0)
Monocytes Relative: 5 %
Neutro Abs: 2.7 10*3/uL (ref 1.7–7.7)
Neutrophils Relative %: 59 %
Platelets: 269 10*3/uL (ref 150–400)
RBC: 4.87 MIL/uL (ref 3.87–5.11)
RDW: 12.5 % (ref 11.5–15.5)
WBC: 4.4 10*3/uL (ref 4.0–10.5)
nRBC: 0 % (ref 0.0–0.2)

## 2021-12-28 LAB — BASIC METABOLIC PANEL
Anion gap: 12 (ref 5–15)
BUN: 11 mg/dL (ref 6–20)
CO2: 20 mmol/L — ABNORMAL LOW (ref 22–32)
Calcium: 9.7 mg/dL (ref 8.9–10.3)
Chloride: 107 mmol/L (ref 98–111)
Creatinine, Ser: 0.88 mg/dL (ref 0.44–1.00)
GFR, Estimated: >60 mL/min (ref >60)
Glucose, Bld: 84 mg/dL (ref 70–99)
Potassium: 3.7 mmol/L (ref 3.5–5.1)
Sodium: 139 mmol/L (ref 135–145)

## 2021-12-28 LAB — I-STAT BETA HCG BLOOD, ED (MC, WL, AP ONLY): I-stat hCG, quantitative: <5 m[IU]/mL (ref <5)

## 2021-12-28 LAB — LACTIC ACID, PLASMA: Lactic Acid, Venous: 1.4 mmol/L (ref 0.5–1.9)

## 2021-12-28 MED ORDER — ONDANSETRON 4 MG PO TBDP
4.0000 mg | ORAL_TABLET | Freq: Once | ORAL | Status: AC
  Administered 2021-12-28: 4 mg via ORAL
  Filled 2021-12-28: qty 1

## 2021-12-28 NOTE — ED Triage Notes (Signed)
Pt reports having surgery right axilla abscess in January. Had return of pain on Friday that is getting progressively worse. Reports pain, swelling, fever and n/v/d.

## 2021-12-28 NOTE — Telephone Encounter (Signed)
Noted, will await ED notes. 

## 2021-12-28 NOTE — Telephone Encounter (Signed)
[facility] Day - Client TELEPHONE ADVICE RECORD AccessNurse Patient Name: Stephanie [name] [facility] Gender: Female DOB: 1996/03/15 Age: 26 Y 1 M 4 D Return Phone Number: [phone] (Primary), [phone] (Secondary) Address: City/ [place]/ [place]  [address] Client [facility] Day - Client Client Site [facility] - Day Provider Vernona Rieger - NP Contact Type Call Who Is Calling Patient / Member / Family / Caregiver Call Type Triage / Clinical Relationship To Patient Self Return Phone Number [phone] (Primary) Chief Complaint Cuts and Lacerations Reason for Call Symptomatic / Request for Health Information Initial Comment Caller states she have a inscion and its leaking green yellowish puss and nausea and diarhea Translation No Nurse Assessment Nurse: Lesly Rubenstein, RN, Crystal Date/Time (Eastern Time): 12/28/2021 2:10:00 PM Confirm and document reason for call. If symptomatic, describe symptoms. ---Caller states she have a incision and its leaking green yellowish puss and nausea and diarrhea. Started Thursday, had abscess removed under arm, right arm pit area. Temp was 99.5. States the pain is radiating down her hand and her arm again like it did when she had to have surgery. States she is currently at the ER, has been triaged, but has been in waiting room for 6 hours. Caller states she is weak, no appetite, has not had anything to eat or drink since last night. Does the patient have any new or worsening symptoms? ---Yes Will a triage be completed? ---Yes Related visit to physician within the last 2 weeks? ---Yes Does the PT have any chronic conditions? (i.e. diabetes, asthma, this includes High risk factors for pregnancy, etc.) ---Yes List chronic conditions. ---Hidradenitis suppurativa, IBS Is the patient pregnant or possibly pregnant? (Ask all females between the ages of 65-55)  ---No Is this a behavioral health or substance abuse call? ---No Guidelines Guideline Title Affirmed Question Affirmed Notes Nurse Date/Time Lamount Cohen Time) Post-Op Incision Symptoms and Questions [1] Incision gaping open AND [2] length Parrott, RN, Crystal 12/28/2021 2:14:11 PM PLEASE NOTE: All timestamps contained within this report are represented as [place] Standard Time. CONFIDENTIALTY NOTICE: This fax transmission is intended only for the addressee. It contains information that is legally privileged, confidential or otherwise protected from use or disclosure. If you are not the intended recipient, you are strictly prohibited from reviewing, disclosing, copying using or disseminating any of this information or taking any action in reliance on or regarding this information. If you have received this fax in error, please notify us immediately by telephone so that we can arrange for its return to [place]. Phone: [phone], Toll-Free: [phone], Fax: [phone] Page: 2 of 2 Call Id: [id number] Guidelines Guideline Title Affirmed Question Affirmed Notes Nurse Date/Time Lamount Cohen Time) of opening > 2 inches (5 cm) Disp. Time Lamount Cohen Time) Disposition Final User 12/28/2021 2:18:13 PM Go to ED Now (or PCP triage) Yes Lesly Rubenstein, RN, Crystal Caller Disagree/Comply Comply Caller Understands Yes PreDisposition Call Doctor Care Advice Given Per Guideline GO TO ED NOW (OR PCP TRIAGE): * IF NO PCP (PRIMARY CARE PROVIDER) SECOND-LEVEL TRIAGE: You need to be seen within the next hour. Go to the ED/UCC at _____________ Hospital. Leave as soon as you can. COVER THE INCISION: CARE ADVICE per Post-Op Incision Symptoms and Questions (Adult) guideline. * Use sterile gauze (best), an adhesive bandage (such as a Band-Aid), or a clean cloth. * Cover the incision with CLEAN DRESSING. Comments User: Floyce Stakes, RN Date/Time Lamount Cohen Time): 12/28/2021 2:22:28 PM Caller states she lost use of  her arm before  due to she waited too long to have it evaluated and she is nervous, wants to know if she should go to another ER. Advised her to call Dr. Derrell Lolling office (surgeon) that performed the procedure due to they normally want to evaluate their surgical sites. Caller is going to do that now. Referrals [facility]

## 2021-12-28 NOTE — Telephone Encounter (Signed)
Per appt notes pt is at [facility] ED. Sending note to Allayne Gitelman NP and [facility] CMA.

## 2021-12-28 NOTE — ED Notes (Signed)
Called pt for room x3, no answer

## 2021-12-28 NOTE — ED Provider Triage Note (Signed)
Emergency Medicine Provider Triage Evaluation Note  Stephanie [name] , a 26 y.o. female  was evaluated in triage.  Pt complains of right axillary abscess.  Patient had abscessed drained in the OR with [facility] on 12/07/2021.  She was placed on doxycycline for 5 days.  She states that beginning last Thursday, 12/21/2021 she started noticing increased pain (worse from postoperative period).  She states that since then she has had intermittent nausea, vomiting, diarrhea.  Notices decreased p.o. intake.  She has had tactile fever at home.  She endorses ongoing increased pain in her right axilla with yellow and green discharge that is malodorous.  She has pictures of drainage on her phone over the past week.  She has an appointment on Tuesday with [facility] however given increased pain and now pain going down her right arm she presents to the emergency department.  Review of Systems  Positive: See above Negative:   Physical Exam  BP 122/88 (BP Location: Left Arm)    Pulse (!) 104    Temp 98.8 F (37.1 C) (Oral)    Resp 17    LMP 12/12/2021    SpO2 99%  Gen:   Awake, no distress   Resp:  Normal effort  MSK:   Moves extremities without difficulty  Other:  Tachycardia.  Mild swelling to the right hand compared to the left.  Radial pulse 2+ bilaterally.  The right axilla has a dry dressing on top of it.  There is a linear incision in the right axilla that has granulation tissue.  There is some mild white drainage.  Tenderness to palpation.  Medical Decision Making  Medically screening exam initiated at 10:30 AM.  Appropriate orders placed.  MARIENA MEARES was informed that the remainder of the evaluation will be completed by another provider, this initial triage assessment does not replace that evaluation, and the importance of remaining in the ED until their evaluation is complete.  We will start sepsis work-up.   Cristopher Peru, PA-C 12/28/21 [phone]

## 2021-12-28 NOTE — Telephone Encounter (Signed)
[facility] Day - Client TELEPHONE ADVICE RECORD AccessNurse Patient Name: Stephanie [name] [facility] Gender: Female DOB: May 23, 1996 Age: 26 Y 1 M 4 D Return Phone Number: [phone] (Primary), [phone] (Secondary) Address: City/ State/ [place]  [address] Client [facility] Day - Client Client Site [facility] - Day Provider Vernona Rieger - NP Contact Type Call Who Is Calling Patient / Member / Family / Caregiver Call Type Triage / Clinical Relationship To Patient Self Return Phone Number [phone] (Primary) Chief Complaint Wound Infection Reason for Call Symptomatic / Request for Health Information Initial Comment Caller states she has a procedure and the incision may be infected. Caller states she has been experiencing nausea, diarrhea, vomiting for five days. Caller states she also has been experiencing hand swelling, pain and green, yellow like discharge from incision. Translation No Nurse Assessment Nurse: Carylon Perches, RN, Hilda Lias Date/Time Lamount Cohen Time): 12/28/2021 9:15:27 AM Confirm and document reason for call. If symptomatic, describe symptoms. ---Caller has Hidradenitis suppurativa. She had a surgery on 1/17 on right arm. Now it seems infected at incision site. R hand is swollen, green drainage coming from incision site. Painful. Not able to move arm much. Feels feverish. Does the patient have any new or worsening symptoms? ---Yes Will a triage be completed? ---Yes Related visit to physician within the last 2 weeks? ---Yes Does the PT have any chronic conditions? (i.e. diabetes, asthma, this includes High risk factors for pregnancy, etc.) ---Yes List chronic conditions. ---Hidradenitis suppurativa, IBS Is the patient pregnant or possibly pregnant? (Ask all females between the ages of 34-55) ---No Is this a behavioral health or substance abuse call? ---No Guidelines Guideline  Title Affirmed Question Affirmed Notes Nurse Date/Time Lamount Cohen Time) Post-Op Incision Symptoms and Questions [1] Incision gaping open AND [2] length Carylon Perches, RN, Hilda Lias 12/28/2021 9:18:07 AM PLEASE NOTE: All timestamps contained within this report are represented as [place] Standard Time. CONFIDENTIALTY NOTICE: This fax transmission is intended only for the addressee. It contains information that is legally privileged, confidential or otherwise protected from use or disclosure. If you are not the intended recipient, you are strictly prohibited from reviewing, disclosing, copying using or disseminating any of this information or taking any action in reliance on or regarding this information. If you have received this fax in error, please notify us immediately by telephone so that we can arrange for its return to [place]. Phone: [phone], Toll-Free: [phone], Fax: [phone] Page: 2 of 2 Call Id: [id number] Guidelines Guideline Title Affirmed Question Affirmed Notes Nurse Date/Time Lamount Cohen Time) of opening > 2 inches (5 cm) Disp. Time Lamount Cohen Time) Disposition Final User 12/28/2021 9:20:57 AM Go to ED Now (or PCP triage) Yes Carylon Perches, RN, Seward Grater Disagree/Comply Comply Caller Understands Yes PreDisposition Did not know what to do Care Advice Given Per Guideline GO TO ED NOW (OR PCP TRIAGE): * IF NO PCP (PRIMARY CARE PROVIDER) SECOND-LEVEL TRIAGE: You need to be seen within the next hour. Go to the ED/UCC at _____________ Hospital. Leave as soon as you can. CARE ADVICE per Post-Op Incision Symptoms and Questions (Adult) guideline. Referrals [facility] - E

## 2021-12-29 ENCOUNTER — Other Ambulatory Visit: Payer: Self-pay

## 2021-12-29 MED ORDER — DOXYCYCLINE HYCLATE 100 MG PO TABS
100.0000 mg | ORAL_TABLET | Freq: Two times a day (BID) | ORAL | 0 refills | Status: DC
  Filled 2021-12-29: qty 14, 7d supply, fill #0

## 2022-01-01 ENCOUNTER — Other Ambulatory Visit: Payer: Self-pay

## 2022-01-02 ENCOUNTER — Other Ambulatory Visit: Payer: Self-pay

## 2022-01-02 LAB — CULTURE, BLOOD (ROUTINE X 2)
Culture: NO GROWTH
Special Requests: ADEQUATE

## 2022-01-02 MED ORDER — TRAMADOL HCL 50 MG PO TABS
50.0000 mg | ORAL_TABLET | Freq: Four times a day (QID) | ORAL | 0 refills | Status: DC | PRN
Start: 2021-12-29 — End: 2022-04-20
  Filled 2022-01-02: qty 10, 3d supply, fill #0

## 2022-01-03 ENCOUNTER — Other Ambulatory Visit (HOSPITAL_COMMUNITY): Payer: Self-pay | Admitting: Student

## 2022-01-03 ENCOUNTER — Other Ambulatory Visit: Payer: Self-pay | Admitting: Student

## 2022-01-03 DIAGNOSIS — L02411 Cutaneous abscess of right axilla: Secondary | ICD-10-CM

## 2022-01-04 ENCOUNTER — Other Ambulatory Visit: Payer: Self-pay

## 2022-02-27 ENCOUNTER — Ambulatory Visit: Payer: No Typology Code available for payment source | Admitting: Primary Care

## 2022-04-17 ENCOUNTER — Telehealth: Payer: Self-pay | Admitting: Primary Care

## 2022-04-17 NOTE — Telephone Encounter (Signed)
[place] Day - Client TELEPHONE ADVICE RECORD Access[facility]urse Patient Name: Raj Janus [facility] Gender: Female DOB: Mar 21, 1996 Age: 26 Y 51 M 25 D Return Phone Number: [phone] (Primary), [phone] (Secondary) Address: City/ State/ [place]  [address] Client [facility] Day - Client Client Site [place] - Day Provider Alma Friendly - NP Contact Type Call Who Is Calling Patient / Member / Family / Caregiver Call Type Triage / Clinical Relationship To Patient Self Return Phone Number [phone] (Primary) Chief Complaint SEVERE ABDOMINAL PAIN - Severe pain in abdomen Reason for Call Symptomatic / Request for Health Information Initial Comment Caller states PT is having moderate to severe sharp pain in abdomen, that comes and goes. blood in stool. Translation No Nurse Assessment Nurse: [place], RN, Estill Bamberg Date/Time (Eastern Time): 04/17/2022 9:48:02 AM Confirm and document reason for call. If symptomatic, describe symptoms. ---caller states she has abd pain and blood in the stool. changes the color or the water. happened last night and this morning. Does the patient have any new or worsening symptoms? ---Yes Will a triage be completed? ---Yes Related visit to physician within the last 2 weeks? ---No Does the PT have any chronic conditions? (i.e. diabetes, asthma, this includes High risk factors for pregnancy, etc.) ---Yes List chronic conditions. ---IBS Is the patient pregnant or possibly pregnant? (Ask all females between the ages of 24-55) ---No Is this a behavioral health or substance abuse call? ---No Guidelines Guideline Title Affirmed Question Affirmed Notes Nurse Date/Time (Eastern Time) Abdominal Pain - Upper Blood in bowel movements (Exception: Blood on surface of BM with constipation.) Humfleet, RN, Estill Bamberg 04/17/2022 9:50:37 AM PLEASE NOTE: All timestamps  contained within this report are represented as [place] Standard Time. CONFIDENTIALTY NOTICE: This fax transmission is intended only for the addressee. It contains information that is legally privileged, confidential or otherwise protected from use or disclosure. If you are not the intended recipient, you are strictly prohibited from reviewing, disclosing, copying using or disseminating any of this information or taking any action in reliance on or regarding this information. If you have received this fax in error, please notify us immediately by telephone so that we can arrange for its return to [place]. Phone: [phone], Toll-Free: [phone], Fax: [phone] Page: 2 of 2 Call Id: [id number] [place]. Time Eilene Ghazi Time) Disposition Final User 04/17/2022 9:45:47 AM Send to Urgent Queue Ian Malkin 04/17/2022 9:53:19 AM Go to ED Now Yes Humfleet, RN, Shelly Coss Disagree/Comply Comply Caller Understands Yes PreDisposition InappropriateToAsk Care Advice Given Per Guideline GO TO ED NOW: * You need to be seen in the Emergency Department. * Go to the ED at ___________ [place] now. Drive carefully. CARE ADVICE given per Abdominal Pain - Upper (Adult) guideline. * Patient should not delay going to the emergency department. * Another adult should drive. Referrals [facility] - E

## 2022-04-17 NOTE — Telephone Encounter (Signed)
Noted.  Please call to check on patient on 05/31.

## 2022-04-17 NOTE — Telephone Encounter (Signed)
Called patient she has spoke to access nurse.

## 2022-04-17 NOTE — Telephone Encounter (Signed)
I spoke with pt; pt said her boyfriend will take her to ED 04/18/22. He is out of town today. No other options. Now abd pain comes and goes not in a lot of pain now but when has 2 mins of random shooting pain in lt side of upper abd. The pain level is 7. No fever. UC & ED precautions given and pt voiced understanding. See note below Stephanie [name] has already spoken with pt. Sending note to Allayne Gitelman NP and [facility] CMA.

## 2022-04-17 NOTE — Telephone Encounter (Signed)
Pt called and stated "she was having sharp pain in her abdomen and blood in her stool. Pt could not describe how the sharp pain was just that it was in her abdomen." I transferred her to Access Nurse (she requested) to speak with someone. Please return a call back when possible, thanks.  Callback Number: [phone]

## 2022-04-18 NOTE — Telephone Encounter (Signed)
Left message to return call to our office.  

## 2022-04-20 ENCOUNTER — Telehealth (INDEPENDENT_AMBULATORY_CARE_PROVIDER_SITE_OTHER): Payer: No Typology Code available for payment source | Admitting: Primary Care

## 2022-04-20 VITALS — Ht 63.0 in | Wt 134.4 lb

## 2022-04-20 DIAGNOSIS — R109 Unspecified abdominal pain: Secondary | ICD-10-CM

## 2022-04-20 DIAGNOSIS — G8929 Other chronic pain: Secondary | ICD-10-CM | POA: Diagnosis not present

## 2022-04-20 DIAGNOSIS — R1012 Left upper quadrant pain: Secondary | ICD-10-CM | POA: Insufficient documentation

## 2022-04-20 DIAGNOSIS — Z8719 Personal history of other diseases of the digestive system: Secondary | ICD-10-CM

## 2022-04-20 NOTE — Progress Notes (Signed)
Patient ID: CHRISTIEN BERTHELOT, female    DOB: [birth year], 26 y.o.   MRN: [id number]  Virtual visit completed through [employer], a video enabled telemedicine application. Due to national recommendations of social distancing due to COVID-19, a virtual visit is felt to be most appropriate for this patient at this time. Reviewed limitations, risks, security and privacy concerns of performing a virtual visit and the availability of in person appointments. I also reviewed that there may be a patient responsible charge related to this service. The patient agreed to proceed.   Patient location: home Provider location: [place] [facility], office Persons participating in this virtual visit: patient, provider   If any vitals were documented, they were collected by patient at home unless specified below.    Ht 5\' 3"  (1.6 m)   Wt 134 lb 6.4 oz (61 kg)   BMI 23.81 kg/m    CC: abdominal pain Subjective:   HPI: Stephanie [name] is a 26 y.o. female with a history of chronic abdominal pain, dysmenorrhea, nausea and vomiting, acute pancreatitis presenting on 04/20/2022 for Abdominal Pain (Left side off and on )  Prior to her visit today she was contacted and asked to come to our office in person as symptoms would be better assessed in the office. She cannot attend an appointment in person as she has no transportation.  Prior to her visit today she was advised to go to the nearest emergency department, she did not do so.   Her pain is located to the left upper abdomen which is intermittent and began four days ago. Pain is worse when waking in the morning, also wakes during the night. She's experienced intermittent nausea since symptom onset, vomited once three days ago.   She denies pain with eating, fevers, changes in appetite, pelvic pain, . She experienced multiple episodes of diarrhea on Monday and Tuesday this week, also with bright red blood in the toilet bowl and on the toilet paper.  Last  bowel movement was two days ago.   She's taken Gas-X and Alka-Seltzer without improvement. She has been hydrating well with water. She underwent colonoscopy in 2018, positive for internal hemorrhoids, negative for diverticulosis. She is not seeing GI now, last visit was in 2021. She underwent upper endoscopy in 2021 which revealed nonbleeding gastric and duodenal ulcers, pathology revealed H. pylori infection.  She was treated at the time      Relevant past medical, surgical, family and social history reviewed and updated as indicated. Interim medical history since our last visit reviewed. Allergies and medications reviewed and updated. Outpatient Medications Prior to Visit  Medication Sig Dispense Refill   acetaminophen (TYLENOL) 500 MG tablet Take 1,000 mg by mouth every 6 (six) hours as needed.     etonogestrel (NEXPLANON) 68 MG IMPL implant 1 each by Subdermal route continuous.     ibuprofen (ADVIL) 200 MG tablet Take 400-600 mg by mouth every 6 (six) hours as needed for headache or moderate pain.     ondansetron (ZOFRAN ODT) 4 MG disintegrating tablet Take 1-2 tablets (4-8 mg total) by mouth every 8 (eight) hours as needed for nausea or vomiting. 30 tablet 0   doxycycline (VIBRA-TABS) 100 MG tablet Take 1 tablet (100 mg total) by mouth 2 (two) times daily. 14 tablet 0   escitalopram (LEXAPRO) 5 MG tablet Take 1 tablet (5 mg total) by mouth daily. 30 tablet 1   traMADol (ULTRAM) 50 MG tablet Take 1 tablet (50 mg total)  by mouth every 6 (six) hours as needed for pain 10 tablet 0   Facility-Administered Medications Prior to Visit  Medication Dose Route Frequency Provider Last Rate Last Admin   0.9 %  sodium chloride infusion  500 mL Intravenous Once Cirigliano, Vito V, DO         Per HPI unless specifically indicated in ROS section below Review of Systems  Constitutional:  Negative for fatigue and fever.  Gastrointestinal:  Positive for abdominal pain, constipation, diarrhea, nausea and  vomiting.  Objective:  Ht 5\' 3"  (1.6 m)   Wt 134 lb 6.4 oz (61 kg)   BMI 23.81 kg/m   Wt Readings from Last 3 Encounters:  04/20/22 134 lb 6.4 oz (61 kg)  12/12/21 129 lb 2 oz (58.6 kg)  12/07/21 123 lb (55.8 kg)       Physical exam: General: Alert and oriented x 3, no distress, does not appear sickly  Pulmonary: Speaks in complete sentences without increased work of breathing, no cough during visit.  Psychiatric: Normal mood, thought content, and behavior.  Abdomen: Left upper abdomen non tender with patient palpating. She does not appear distended.   Exam is limited given virtual platform.     Results for orders placed or performed during the hospital encounter of 12/28/21  Blood culture (routine x 2)   Specimen: BLOOD  Result Value Ref Range   Specimen Description BLOOD RIGHT ANTECUBITAL    Special Requests      BOTTLES DRAWN AEROBIC AND ANAEROBIC Blood Culture adequate volume   Culture      NO GROWTH 5 DAYS Performed at [facility], [address] [place], [place]    Report Status 01/02/2022 FINAL   Basic metabolic panel  Result Value Ref Range   Sodium 139 135 - 145 mmol/L   Potassium 3.7 3.5 - 5.1 mmol/L   Chloride 107 98 - 111 mmol/L   CO2 20 (L) 22 - 32 mmol/L   Glucose, Bld 84 70 - 99 mg/dL   BUN 11 6 - 20 mg/dL   Creatinine, Ser 01/04/2022 0.44 - 1.00 mg/dL   Calcium 9.7 8.9 - 6.04 mg/dL   GFR, Estimated 54.0 >98 mL/min   Anion gap 12 5 - 15  CBC with Differential  Result Value Ref Range   WBC 4.4 4.0 - 10.5 K/uL   RBC 4.87 3.87 - 5.11 MIL/uL   Hemoglobin 15.5 (H) 12.0 - 15.0 g/dL   HCT >11 91.4 - 78.2 %   MCV 92.8 80.0 - 100.0 fL   MCH 31.8 26.0 - 34.0 pg   MCHC 34.3 30.0 - 36.0 g/dL   RDW 95.6 21.3 - 08.6 %   Platelets 269 150 - 400 K/uL   nRBC 0.0 0.0 - 0.2 %   Neutrophils Relative % 59 %   Neutro Abs 2.7 1.7 - 7.7 K/uL   Lymphocytes Relative 34 %   Lymphs Abs 1.5 0.7 - 4.0 K/uL   Monocytes Relative 5 %   Monocytes Absolute  0.2 0.1 - 1.0 K/uL   Eosinophils Relative 1 %   Eosinophils Absolute 0.0 0.0 - 0.5 K/uL   Basophils Relative 1 %   Basophils Absolute 0.0 0.0 - 0.1 K/uL   Immature Granulocytes 0 %   Abs Immature Granulocytes 0.01 0.00 - 0.07 K/uL  Lactic acid, plasma  Result Value Ref Range   Lactic Acid, Venous 1.4 0.5 - 1.9 mmol/L  I-Stat beta hCG blood, ED  Result Value Ref Range  I-stat hCG, quantitative <5.0 <5 mIU/mL   Comment 3           Assessment & Plan:   Problem List Items Addressed This Visit       Other   Left upper quadrant abdominal pain - Primary    We strongly encouraged patient to come into the office today for examination, she declined as she had no transportation. Exam limited today due to virtual platform.  Checking labs including H. pylori breath test, CBC with differential, lipase.  We discussed ongoing hydration with water, advance diet as tolerated.  We also discussed strict ED/urgent care precautions if pain persist.  Referral placed to GI for her chronic abdominal symptoms that seem to wax and wane several times annually.       Relevant Orders   CBC with Differential/Platelet   Lipase   H. pylori breath test   Ambulatory referral to Gastroenterology   Other Visit Diagnoses     Chronic abdominal pain       Relevant Orders   Ambulatory referral to Gastroenterology   History of pancreatitis       Relevant Orders   Ambulatory referral to Gastroenterology        No orders of the defined types were placed in this encounter.  Orders Placed This Encounter  Procedures   CBC with Differential/Platelet    Standing Status:   Future    Standing Expiration Date:   04/21/2023   Lipase    Standing Status:   Future    Standing Expiration Date:   04/21/2023   H. pylori breath test    Standing Status:   Future    Standing Expiration Date:   04/21/2023   Ambulatory referral to Gastroenterology    Referral Priority:   Routine    Referral Type:   Consultation     Referral Reason:   Specialty Services Required    Number of Visits Requested:   1    I discussed the assessment and treatment plan with the patient. The patient was provided an opportunity to ask questions and all were answered. The patient agreed with the plan and demonstrated an understanding of the instructions. The patient was advised to call back or seek an in-person evaluation if the symptoms worsen or if the condition fails to improve as anticipated.  Follow up plan:  Please obtain your lab work as soon as possible.   You will be contacted regarding your referral to GI.  Please let us know if you have not been contacted within two weeks.   It was a pleasure to see you today!    Doreene NestKatherine K Aryella Besecker, NP

## 2022-04-20 NOTE — Assessment & Plan Note (Addendum)
We strongly encouraged patient to come into the office today for examination, she declined as she had no transportation. Exam limited today due to virtual platform.  Reviewed endoscopy report from 2021, reviewed colonoscopy report from 2018.  Checking labs including H. pylori breath test, CBC with differential, lipase.  We discussed ongoing hydration with water, advance diet as tolerated.  We also discussed strict ED/urgent care precautions if pain persist.  Referral placed to GI for her chronic abdominal symptoms that seem to wax and wane several times annually.

## 2022-04-20 NOTE — Patient Instructions (Signed)
Please obtain your lab work as soon as possible.   You will be contacted regarding your referral to GI.  Please let us know if you have not been contacted within two weeks.   It was a pleasure to see you today!

## 2022-04-23 NOTE — Telephone Encounter (Signed)
Patient was seen no further action needed at this time.

## 2022-06-27 ENCOUNTER — Encounter: Payer: No Typology Code available for payment source | Admitting: Primary Care

## 2022-10-05 ENCOUNTER — Ambulatory Visit (INDEPENDENT_AMBULATORY_CARE_PROVIDER_SITE_OTHER): Payer: No Typology Code available for payment source | Admitting: Family Medicine

## 2022-10-05 ENCOUNTER — Encounter: Payer: Self-pay | Admitting: Family Medicine

## 2022-10-05 VITALS — BP 104/70 | HR 123 | Temp 99.2°F | Ht 63.0 in | Wt 135.1 lb

## 2022-10-05 DIAGNOSIS — N912 Amenorrhea, unspecified: Secondary | ICD-10-CM | POA: Insufficient documentation

## 2022-10-05 LAB — HCG, QUANTITATIVE, PREGNANCY: Quantitative HCG: 1.76 m[IU]/mL

## 2022-10-05 LAB — POCT URINE PREGNANCY: Preg Test, Ur: NEGATIVE

## 2022-10-05 NOTE — Assessment & Plan Note (Signed)
Acute, Nexplanon in place.  Given 2 negative pregnancy tests at home, 2 positive pregnancy tests and negative urine pregnancy test here but we will verify with serum hCG quantitative to rule out pregnancy. Discussed moving forward with seeing gynecology for replacement of Nexplanon.

## 2022-10-05 NOTE — Progress Notes (Signed)
Patient ID: Stephanie [name], female    DOB: 1996-11-11, 26 y.o.   MRN: [id number]  This visit was conducted in person.  BP 104/70   Pulse (!) 123   Temp 99.2 F (37.3 C) (Oral)   Ht 5\' 3"  (1.6 m)   Wt 135 lb 2 oz (61.3 kg)   LMP 08/18/2022   SpO2 98%   BMI 23.94 kg/m    CC:  Chief Complaint  Patient presents with   Amenorrhea    Request pregnancy test    Subjective:   HPI: Stephanie [name] is a 26 y.o. female p patient of 22 resenting on 10/05/2022 for Amenorrhea (Request pregnancy test)   She has Nexplanon  in arm this month.   She has missed her menses in last month.  Last nml menses was 9/30-10/02/2022 Last week only had very light spotting, has been ahving a headache. Feeling body aches.  Occ nausea ( has IBS)  No breast tenderness.  Usually menses is very regular.   She had 2 positive and 2 negative pregnancy tests.   He in office Upreg was negative.      Relevant past medical, surgical, family and social history reviewed and updated as indicated. Interim medical history since our last visit reviewed. Allergies and medications reviewed and updated. Outpatient Medications Prior to Visit  Medication Sig Dispense Refill   acetaminophen (TYLENOL) 500 MG tablet Take 1,000 mg by mouth every 6 (six) hours as needed.     etonogestrel (NEXPLANON) 68 MG IMPL implant 1 each by Subdermal route continuous.     ibuprofen (ADVIL) 200 MG tablet Take 400-600 mg by mouth every 6 (six) hours as needed for headache or moderate pain.     ondansetron (ZOFRAN ODT) 4 MG disintegrating tablet Take 1-2 tablets (4-8 mg total) by mouth every 8 (eight) hours as needed for nausea or vomiting. 30 tablet 0   Facility-Administered Medications Prior to Visit  Medication Dose Route Frequency Provider Last Rate Last Admin   0.9 %  sodium chloride infusion  500 mL Intravenous Once Cirigliano, Vito V, DO         Per HPI unless specifically indicated in ROS section  below Review of Systems  Constitutional:  Negative for fatigue and fever.  HENT:  Negative for congestion.   Eyes:  Negative for pain.  Respiratory:  Negative for cough and shortness of breath.   Cardiovascular:  Negative for chest pain, palpitations and leg swelling.  Gastrointestinal:  Negative for abdominal pain.  Genitourinary:  Negative for dysuria and vaginal bleeding.  Musculoskeletal:  Negative for back pain.  Neurological:  Negative for syncope, light-headedness and headaches.  Psychiatric/Behavioral:  Negative for dysphoric mood.    Objective:  BP 104/70   Pulse (!) 123   Temp 99.2 F (37.3 C) (Oral)   Ht 5\' 3"  (1.6 m)   Wt 135 lb 2 oz (61.3 kg)   LMP 08/18/2022   SpO2 98%   BMI 23.94 kg/m   Wt Readings from Last 3 Encounters:  10/05/22 135 lb 2 oz (61.3 kg)  04/20/22 134 lb 6.4 oz (61 kg)  12/12/21 129 lb 2 oz (58.6 kg)      Physical Exam Constitutional:      General: She is not in acute distress.    Appearance: Normal appearance. She is well-developed. She is not ill-appearing or toxic-appearing.  HENT:     Head: Normocephalic.     Right Ear: Hearing, tympanic membrane, ear canal  and external ear normal. Tympanic membrane is not erythematous, retracted or bulging.     Left Ear: Hearing, tympanic membrane, ear canal and external ear normal. Tympanic membrane is not erythematous, retracted or bulging.     Nose: No mucosal edema or rhinorrhea.     Right Sinus: No maxillary sinus tenderness or frontal sinus tenderness.     Left Sinus: No maxillary sinus tenderness or frontal sinus tenderness.     Mouth/Throat:     Pharynx: Uvula midline.  Eyes:     General: Lids are normal. Lids are everted, no foreign bodies appreciated.     Conjunctiva/sclera: Conjunctivae normal.     Pupils: Pupils are equal, round, and reactive to light.  Neck:     Thyroid: No thyroid mass or thyromegaly.     Vascular: No carotid bruit.     Trachea: Trachea normal.  Cardiovascular:      Rate and Rhythm: Normal rate and regular rhythm.     Pulses: Normal pulses.     Heart sounds: Normal heart sounds, S1 normal and S2 normal. No murmur heard.    No friction rub. No gallop.  Pulmonary:     Effort: Pulmonary effort is normal. No tachypnea or respiratory distress.     Breath sounds: Normal breath sounds. No decreased breath sounds, wheezing, rhonchi or rales.  Abdominal:     General: Bowel sounds are normal.     Palpations: Abdomen is soft.     Tenderness: There is no abdominal tenderness.  Musculoskeletal:     Cervical back: Normal range of motion and neck supple.  Skin:    General: Skin is warm and dry.     Findings: No rash.  Neurological:     Mental Status: She is alert.  Psychiatric:        Mood and Affect: Mood is not anxious or depressed.        Speech: Speech normal.        Behavior: Behavior normal. Behavior is cooperative.        Thought Content: Thought content normal.        Judgment: Judgment normal.       Results for orders placed or performed in visit on 10/05/22  POCT urine pregnancy  Result Value Ref Range   Preg Test, Ur Negative Negative     COVID 19 screen:  No recent travel or known exposure to COVID19 The patient denies respiratory symptoms of COVID 19 at this time. The importance of social distancing was discussed today.   Assessment and Plan    Problem List Items Addressed This Visit     Amenorrhea - Primary    Acute, Nexplanon in place.  Given 2 negative pregnancy tests at home, 2 positive pregnancy tests and negative urine pregnancy test here but we will verify with serum hCG quantitative to rule out pregnancy. Discussed moving forward with seeing gynecology for replacement of Nexplanon.      Relevant Orders   POCT urine pregnancy (Completed)   hCG, quantitative, pregnancy      Kerby Nora, MD

## 2022-10-05 NOTE — Patient Instructions (Signed)
Please stop at the lab to have labs drawn.  

## 2022-10-29 ENCOUNTER — Ambulatory Visit: Payer: No Typology Code available for payment source | Admitting: Family Medicine

## 2022-11-30 ENCOUNTER — Ambulatory Visit: Payer: No Typology Code available for payment source | Admitting: Family Medicine

## 2023-03-27 ENCOUNTER — Emergency Department (HOSPITAL_COMMUNITY): Payer: Self-pay

## 2023-03-27 ENCOUNTER — Emergency Department (HOSPITAL_COMMUNITY)
Admission: EM | Admit: 2023-03-27 | Discharge: 2023-03-27 | Disposition: A | Discharge status: Home / Self Care | Payer: Self-pay | Attending: Emergency Medicine | Admitting: Emergency Medicine

## 2023-03-27 ENCOUNTER — Telehealth: Payer: Self-pay | Admitting: Primary Care

## 2023-03-27 ENCOUNTER — Other Ambulatory Visit: Payer: Self-pay

## 2023-03-27 ENCOUNTER — Encounter (HOSPITAL_COMMUNITY): Payer: Self-pay

## 2023-03-27 DIAGNOSIS — G43809 Other migraine, not intractable, without status migrainosus: Secondary | ICD-10-CM | POA: Insufficient documentation

## 2023-03-27 LAB — BASIC METABOLIC PANEL
Anion gap: 11 (ref 5–15)
BUN: 13 mg/dL (ref 6–20)
CO2: 21 mmol/L — ABNORMAL LOW (ref 22–32)
Calcium: 9.4 mg/dL (ref 8.9–10.3)
Chloride: 105 mmol/L (ref 98–111)
Creatinine, Ser: 0.79 mg/dL (ref 0.44–1.00)
GFR, Estimated: >60 mL/min (ref >60)
Glucose, Bld: 74 mg/dL (ref 70–99)
Potassium: 3.9 mmol/L (ref 3.5–5.1)
Sodium: 137 mmol/L (ref 135–145)

## 2023-03-27 LAB — CBC
HCT: 45.1 % (ref 36.0–46.0)
Hemoglobin: 14.9 g/dL (ref 12.0–15.0)
MCH: 31 pg (ref 26.0–34.0)
MCHC: 33 g/dL (ref 30.0–36.0)
MCV: 93.8 fL (ref 80.0–100.0)
Platelets: 240 10*3/uL (ref 150–400)
RBC: 4.81 MIL/uL (ref 3.87–5.11)
RDW: 12.3 % (ref 11.5–15.5)
WBC: 5.1 10*3/uL (ref 4.0–10.5)
nRBC: 0 % (ref 0.0–0.2)

## 2023-03-27 LAB — URINALYSIS, ROUTINE W REFLEX MICROSCOPIC
Bacteria, UA: NONE SEEN
Bilirubin Urine: NEGATIVE
Glucose, UA: NEGATIVE mg/dL
Ketones, ur: 80 mg/dL — AB
Leukocytes,Ua: NEGATIVE
Nitrite: NEGATIVE
Protein, ur: 30 mg/dL — AB
Specific Gravity, Urine: 1.029 (ref 1.005–1.030)
pH: 5 (ref 5.0–8.0)

## 2023-03-27 LAB — CBG MONITORING, ED: Glucose-Capillary: 72 mg/dL (ref 70–99)

## 2023-03-27 LAB — I-STAT BETA HCG BLOOD, ED (MC, WL, AP ONLY): I-stat hCG, quantitative: <5 m[IU]/mL (ref <5)

## 2023-03-27 MED ORDER — PROCHLORPERAZINE EDISYLATE 10 MG/2ML IJ SOLN
10.0000 mg | Freq: Once | INTRAMUSCULAR | Status: AC
  Administered 2023-03-27: 10 mg via INTRAVENOUS
  Filled 2023-03-27: qty 2

## 2023-03-27 MED ORDER — LORAZEPAM 0.5 MG PO TABS: 0.5000 mg | ORAL_TABLET | Freq: Once | ORAL | Status: DC

## 2023-03-27 MED ORDER — KETOROLAC TROMETHAMINE 15 MG/ML IJ SOLN
15.0000 mg | Freq: Once | INTRAMUSCULAR | Status: AC
  Administered 2023-03-27: 15 mg via INTRAVENOUS
  Filled 2023-03-27: qty 1

## 2023-03-27 MED ORDER — DIPHENHYDRAMINE HCL 50 MG/ML IJ SOLN
12.5000 mg | Freq: Once | INTRAMUSCULAR | Status: AC
  Administered 2023-03-27: 12.5 mg via INTRAVENOUS
  Filled 2023-03-27: qty 1

## 2023-03-27 MED ORDER — SODIUM CHLORIDE 0.9 % IV BOLUS: 1000.0000 mL | Freq: Once | INTRAVENOUS | Status: DC

## 2023-03-27 NOTE — ED Notes (Signed)
Pt ambulated from ed with steady gait. Pt verbalized understanding to discharge instructions. IV removed.

## 2023-03-27 NOTE — Telephone Encounter (Signed)
[facility] Day - Client TELEPHONE ADVICE RECORD AccessNurse Patient Name First: Stephanie Last: [name] Gender: Female DOB: 01/08/1996 Age: 27 Y 4 M 3 D Return Phone Number: [phone] (Primary), [phone] (Secondary) Address: City/ [place]/ [name]  [address] Client [facility] Day - Client Client Site [facility] - Day Provider Vernona Rieger - NP Contact Type Call Who Is Calling Patient / Member / Family / Caregiver Call Type Triage / Clinical Relationship To Patient Self Return Phone Number [phone] (Primary) Chief Complaint BREATHING - shortness of breath or sounds breathless Reason for Call Symptomatic / Request for Health Information Initial Comment Caller states she has a headache, shortness of breath, dizziness, light headed and sweating. Caller transferred from the office. Translation No   Nurse Assessment Nurse: Stephanie Amos, RN, Margaret Date/Time (Eastern Time): 03/27/2023 11:51:40 AM Confirm and document reason for call. If symptomatic, describe symptoms. ---Caller states she has some shortness of breath, dizziness, and light headedness when walking. Started with a headache yesterday, worse on the right side. Does the patient have any new or worsening symptoms? ---Yes Will a triage be completed? ---Yes Related visit to physician within the last 2 weeks? ---No Does the PT have any chronic conditions? (i.e. diabetes, asthma, this includes High risk factors for pregnancy, etc.) ---No Is the patient pregnant or possibly pregnant? (Ask all females between the ages of 74-55) ---No Is this a behavioral health or substance abuse call? ---No   Guidelines Guideline Title Affirmed Question Affirmed Notes Nurse Date/Time (Eastern Time) Headache [1] SEVERE headache (e.g., excruciating) AND [2] "worst headache" of life Stephanie [name], RNClaris [name] 03/27/2023 11:55:25 AM PLEASE  NOTE: All timestamps contained within this report are represented as [place] Standard Time. CONFIDENTIALTY NOTICE: This fax transmission is intended only for the addressee. It contains information that is legally privileged, confidential or otherwise protected from use or disclosure. If you are not the intended recipient, you are strictly prohibited from reviewing, disclosing, copying using or disseminating any of this information or taking any action in reliance on or regarding this information. If you have received this fax in error, please notify us immediately by telephone so that we can arrange for its return to [place]. Phone: [phone], Toll-Free: [phone], Fax: [phone] Page: 2 of 2 Call Id: [id number] Disp. Time Stephanie [name] Time) Disposition Final User 03/27/2023 11:47:19 AM Send to Urgent Stephanie [name] 03/27/2023 12:02:21 PM Go to ED Now (or PCP triage) Yes Stephanie Amos, RN, Stephanie [name] Final Disposition 03/27/2023 12:02:21 PM Go to ED Now (or PCP triage) Yes Stephanie Amos, RN, Stephanie [name] Disagree/Comply Comply Caller Understands Yes PreDisposition Call Doctor Care Advice Given Per Guideline * IF PCP SECOND-LEVEL TRIAGE REQUIRED: You may need to be seen. Your doctor (or NP/PA) will want to talk with you to decide what's best. I'll page the provider on-call now. If you haven't heard from the provider (or me) within 30 minutes, go directly to the ED/UCC at _____________ Hospital. CARE ADVICE given per Headache (Adult) guideline. ANOTHER ADULT SHOULD DRIVE: Referrals GO TO FACILITY UNDECIDE

## 2023-03-27 NOTE — ED Provider Notes (Signed)
[place] EMERGENCY DEPARTMENT AT [facility] Provider Note   CSN: [id number] Arrival date & time: 03/27/23  1347    History  Chief Complaint  Patient presents with   Headache   Dizziness    Stephanie [name] is a 27 y.o. female with migraine, anxiety, near syncope here for evaluation of 2 days of right-sided headache.  No relief with home medications.  Has had some lightheadedness and dizziness, no sudden onset thunderclap headache.  No head trauma. She feels like she has some blurred vision to her right eye. Pain to surrounding right eye. Felt generally weak like she is going to pass out however no actual syncope.  No neck rigidity.  Occasional is had some tingling to her bilateral upper extremities with some tremors.  No documented fever, meningismus, emesis, CP, abd pain, focal weakness.  No diplopia, floaters. Hard to see distance. No contacts, wears glasses. No eye drainage. Some SOB, cough. No hemoptysis, LE swelling, hx of PE/DVT  HPI     Home Medications Prior to Admission medications   Medication Sig Start Date End Date Taking? Authorizing Provider  acetaminophen (TYLENOL) 500 MG tablet Take 1,000 mg by mouth every 6 (six) hours as needed.    [provider]  etonogestrel (NEXPLANON) 68 MG IMPL implant 1 each by Subdermal route continuous. 09/30/18   [provider]  ibuprofen (ADVIL) 200 MG tablet Take 400-600 mg by mouth every 6 (six) hours as needed for headache or moderate pain.    [provider]  ondansetron (ZOFRAN ODT) 4 MG disintegrating tablet Take 1-2 tablets (4-8 mg total) by mouth every 8 (eight) hours as needed for nausea or vomiting. 05/25/21   Anyanwu, Jethro Bastos, MD  dicyclomine (BENTYL) 20 MG tablet Take 1 tablet (20 mg total) by mouth 2 (two) times daily. 10/26/19 05/17/20  Joy, Shawn C, PA-C  famotidine (PEPCID) 20 MG tablet Take 1 tablet (20 mg total) by mouth 2 (two) times daily. 02/07/20 05/17/20  Kalissa Grays A, PA-C   metoCLOPramide (REGLAN) 10 MG tablet Take 1 tablet (10 mg total) by mouth every 6 (six) hours as needed for nausea or vomiting. Patient not taking: Reported on 09/19/2019 06/19/19 09/19/19  Caccavale, Sophia, PA-C  pantoprazole (PROTONIX) 40 MG tablet Take 40 mg twice a day for 8 weeks 03/04/20 05/17/20  Cirigliano, Verlin Dike, DO  promethazine (PHENERGAN) 25 MG suppository Place 1 suppository (25 mg total) rectally every 6 (six) hours as needed for nausea or vomiting. Patient not taking: Reported on 03/04/2020 02/07/20 05/17/20  Dealva Lafoy A, PA-C  sucralfate (CARAFATE) 1 GM/10ML suspension Take 10 mLs (1 g total) by mouth 4 (four) times daily -  with meals and at bedtime. 02/07/20 05/17/20  Glenna Brunkow A, PA-C      Allergies    Almond (diagnostic), Coconut flavor, and Septra [sulfamethoxazole-trimethoprim]    Review of Systems   Review of Systems  Constitutional:  Positive for chills and fatigue.  HENT: Negative.    Eyes:  Positive for visual disturbance. Negative for photophobia, discharge, redness and itching.  Respiratory:  Positive for cough and shortness of breath.   Cardiovascular: Negative.   Gastrointestinal:  Positive for nausea. Negative for abdominal distention, abdominal pain, diarrhea and vomiting.  Genitourinary: Negative.   Musculoskeletal: Negative.   Neurological:  Positive for weakness, light-headedness and headaches. Negative for seizures, syncope (generalized) and speech difficulty.  All other systems reviewed and are negative.   Physical Exam Updated Vital Signs BP 109/65 (BP  Location: Left Arm)   Pulse (!) 112   Temp 98.6 F (37 C) (Oral)   Resp 18   Ht 5\' 3"  (1.6 m)   Wt 59.4 kg   SpO2 100%   BMI 23.21 kg/m  Physical Exam Vitals and nursing note reviewed.  Constitutional:      General: She is not in acute distress.    Appearance: She is well-developed. She is not ill-appearing, toxic-appearing or diaphoretic.  HENT:     Head: Normocephalic and  atraumatic.     Mouth/Throat:     Mouth: Mucous membranes are moist.  Eyes:     General: No allergic shiner, visual field deficit or scleral icterus.       Right eye: No foreign body, discharge or hordeolum.        Left eye: No foreign body, discharge or hordeolum.     Extraocular Movements: Extraocular movements intact.     Right eye: Normal extraocular motion and no nystagmus.     Left eye: Normal extraocular motion and no nystagmus.     Conjunctiva/sclera: Conjunctivae normal.     Pupils: Pupils are equal, round, and reactive to light. Pupils are equal.     Visual Fields: Right eye visual fields normal.     Comments: [place] neg for detachment, normal IOP  Neck:     Trachea: Trachea and phonation normal.     Comments: Full ROM, no meningismus Cardiovascular:     Rate and Rhythm: Normal rate.     Pulses: Normal pulses.          Radial pulses are 2+ on the right side and 2+ on the left side.       Dorsalis pedis pulses are 2+ on the right side and 2+ on the left side.     Heart sounds: Normal heart sounds.  Pulmonary:     Effort: Pulmonary effort is normal. No respiratory distress.     Breath sounds: Normal breath sounds and air entry.  Abdominal:     General: Bowel sounds are normal. There is no distension.     Palpations: Abdomen is soft.     Tenderness: There is no abdominal tenderness.  Musculoskeletal:        General: Normal range of motion.     Cervical back: Full passive range of motion without pain and normal range of motion.     Comments: No bony tenderness, ROM  Skin:    General: Skin is warm and dry.     Capillary Refill: Capillary refill takes less than 2 seconds.     Comments: No rashes or lesions  Neurological:     General: No focal deficit present.     Mental Status: She is alert.     Cranial Nerves: Cranial nerves 2-12 are intact.     Sensory: Sensation is intact.     Motor: Motor function is intact.     Coordination: Coordination is intact.     Gait: Gait is  intact.     Comments: CN 2-12 grossly intact Equal strength BIL Intact sensation  Psychiatric:        Mood and Affect: Mood normal.     ED Results / Procedures / Treatments   Labs (all labs ordered are listed, but only abnormal results are displayed) Labs Reviewed  BASIC METABOLIC PANEL - Abnormal; Notable for the following components:      Result Value   CO2 21 (*)    All other components within normal limits  URINALYSIS, ROUTINE W REFLEX MICROSCOPIC - Abnormal; Notable for the following components:   Hgb urine dipstick SMALL (*)    Ketones, ur 80 (*)    Protein, ur 30 (*)    All other components within normal limits  CBC  CBG MONITORING, ED  I-STAT BETA HCG BLOOD, ED ([facility], [facility], [facility] ONLY)    EKG None  Radiology DG Chest 2 View  Result Date: 03/27/2023 CLINICAL DATA:  Shortness of breath EXAM: CHEST - 2 VIEW COMPARISON:  Chest x-ray 03/27/2016.  CT chest 10/22/2018 FINDINGS: The heart size and mediastinal contours are within normal limits. Both lungs are clear. No consolidation, pneumothorax or effusion. No edema. The visualized skeletal structures are unremarkable. IMPRESSION: No acute cardiopulmonary disease Electronically Signed   By: Karen Kays M.D.   On: 03/27/2023 18:10    Procedures Ultrasound ED Ocular  Date/Time: 03/27/2023 5:11 PM  Performed by: Ralph Leyden A, PA-C Authorized by: Ralph Leyden A, PA-C   PROCEDURE DETAILS:    Indications: eye pain and visual change     Assessed:  Right eye   Right eye axial view: obtained     Right eye sagittal view: obtained     Images: archived     Limitations:  None RIGHT EYE FINDINGS:     no foreign body noted in right eye    right eye lens not dislodged    no retrobulbar hematoma in right eye    no evidence of retinal detachment of the right eye     Medications Ordered in ED Medications  prochlorperazine (COMPAZINE) injection 10 mg (10 mg Intravenous Given 03/27/23 1715)  diphenhydrAMINE (BENADRYL) injection  12.5 mg (12.5 mg Intravenous Given 03/27/23 1713)  ketorolac (TORADOL) 15 MG/ML injection 15 mg (15 mg Intravenous Given 03/27/23 1714)   ED Course/ Medical Decision Making/ A&P   27 year old here for evaluation of multiple complaints.  Patient with headache, lightheadedness, surrounding eye pain, blurred vision with distance, shortness of breath, tingling all over the upper extremities, generalized weakness. No documented fever.  She has a nonfocal neuroexam without deficits.  PERRLA, no surrounding erythema, warmth to suggest periorbital or orbital cellulitis.  No injected conjunctive, no hyphema.  Ultrasound at bedside negative for detachment.  Patient occasionally has some tremors to her upper extremities however no seizure-like movement appears to have some anxiolytic component.  Her heart and lungs are clear.  Abdomen soft, nontender.  She does not have skin changes to her extremities.  Her compartments are soft she is Wells criteria low risk.  No neck stiffness neck rigidity low suspicion for meningitis.  No personal or family history of MS.  Labs and imaging personally viewed and interpreted:  CBC without leukocytosis Metabolic panel without significant abnormality  pregnancy test negative UA negative for infection X-ray without pneumonia, pneumothorax, infiltrates  Reassessed.  We discussed her labs and imaging.  She is pending migraine cocktail.  Nursing informed me that patient is requesting to leave.  Her symptoms have completely resolved after migraine cocktail.  We had discussed previously and placed orders for an MRI to rule out acute intracranial abnormality/MS.  She states she does not feel she needs this at this time and has follow-up with PCP tomorrow.  We discussed risk versus benefit of not performing imaging which she voices understanding.  Declines MRI at this time.  Will DC home with symptomatic management.  She will return for new or worsening symptoms.  Could have certainly had  a complicated migraine.  The patient  has been appropriately medically screened and/or stabilized in the ED. I have low suspicion for any other emergent medical condition which would require further screening, evaluation or treatment in the ED or require inpatient management.  Patient is hemodynamically stable and in no acute distress.  Patient able to ambulate in department prior to ED.  Evaluation does not show acute pathology that would require ongoing or additional emergent interventions while in the emergency department or further inpatient treatment.  I have discussed the diagnosis with the patient and answered all questions.  Pain is been managed while in the emergency department and patient has no further complaints prior to discharge.  Patient is comfortable with plan discussed in room and is stable for discharge at this time.  I have discussed strict return precautions for returning to the emergency department.  Patient was encouraged to follow-up with PCP/specialist refer to at discharge.                               Medical Decision Making Amount and/or Complexity of Data Reviewed Independent Historian: friend External Data Reviewed: labs, radiology, ECG and notes. Labs: ordered. Decision-making details documented in ED Course. Radiology: ordered and independent interpretation performed. Decision-making details documented in ED Course. ECG/medicine tests: ordered and independent interpretation performed. Decision-making details documented in ED Course.  Risk OTC drugs. Prescription drug management. Parenteral controlled substances. Decision regarding hospitalization. Diagnosis or treatment significantly limited by social determinants of health.          Final Clinical Impression(s) / ED Diagnoses Final diagnoses:  Other migraine without status migrainosus, not intractable    Rx / DC Orders ED Discharge Orders     None         Dericka Ostenson A,  PA-C 03/27/23 1947    Melene Plan, DO 03/27/23 1951

## 2023-03-27 NOTE — ED Triage Notes (Signed)
Right sided headache for 2 days. No relief with home medications. C/o multiple episodes of dizziness today and blurry vision to right eye. Pt felt like she was going to pass out, but did not.

## 2023-03-27 NOTE — Telephone Encounter (Signed)
Per access note pt agreed to go to ED. Sending note to Allayne Gitelman NP and [facility] pool.

## 2023-03-27 NOTE — Discharge Instructions (Signed)
It was a pleasure taking care of you here in the emergency department  Your symptoms were likely related to a migraine  As discussed we recommend to get an MRI however he feels improved and does not want this imaging study.  We discussed the possibility of missing an emergent pathology.  Make sure to keep her follow-up appointment with your primary care provider ureteral stone   Return for any worsening symptoms

## 2023-03-27 NOTE — Telephone Encounter (Signed)
Noted, will await ED notes. 

## 2023-03-27 NOTE — Telephone Encounter (Signed)
FYI: This call has been transferred to Access Nurse. Once the result note has been entered staff can address the message at that time.  Patient called in with the following symptoms:  Red Word:dizziness  Patient stated that she has been dealing with a headache and experiencing SOB, lightheadedness, dizziness, and some sweating.   Please advise at Mobile [phone] (mobile)  Message is routed to Provider Pool and [facility] Triage

## 2023-03-27 NOTE — ED Notes (Signed)
Pt reports blurry vision is now in both eyes

## 2023-03-28 ENCOUNTER — Encounter: Payer: Self-pay | Admitting: Primary Care

## 2023-03-28 ENCOUNTER — Ambulatory Visit: Payer: Self-pay | Admitting: Primary Care

## 2023-03-28 ENCOUNTER — Ambulatory Visit (INDEPENDENT_AMBULATORY_CARE_PROVIDER_SITE_OTHER): Payer: Self-pay | Admitting: Primary Care

## 2023-03-28 ENCOUNTER — Telehealth: Payer: Self-pay | Admitting: Primary Care

## 2023-03-28 VITALS — BP 136/72 | HR 110 | Temp 98.1°F | Ht 63.0 in | Wt 125.0 lb

## 2023-03-28 DIAGNOSIS — F411 Generalized anxiety disorder: Secondary | ICD-10-CM

## 2023-03-28 DIAGNOSIS — G43709 Chronic migraine without aura, not intractable, without status migrainosus: Secondary | ICD-10-CM

## 2023-03-28 LAB — T4, FREE: Free T4: 1.3 ng/dL (ref 0.60–1.60)

## 2023-03-28 LAB — TSH: TSH: 0.73 u[IU]/mL (ref 0.35–5.50)

## 2023-03-28 MED ORDER — PROPRANOLOL HCL ER 80 MG PO CP24
80.0000 mg | ORAL_CAPSULE | Freq: Every day | ORAL | 0 refills | Status: DC
Start: 2023-03-28 — End: 2023-03-29

## 2023-03-28 MED ORDER — SUMATRIPTAN SUCCINATE 50 MG PO TABS
ORAL_TABLET | ORAL | 0 refills | Status: DC
Start: 2023-03-28 — End: 2023-03-29

## 2023-03-28 MED ORDER — KETOROLAC TROMETHAMINE 60 MG/2ML IM SOLN
60.0000 mg | Freq: Once | INTRAMUSCULAR | Status: AC
Start: 2023-03-28 — End: 2023-03-28
  Administered 2023-03-28: 60 mg via INTRAMUSCULAR

## 2023-03-28 NOTE — Patient Instructions (Signed)
Start propranolol ER 80 mg for headache/migraine prevention. Take 1 capsule by mouth every evening at bedtime.  You may take sumatriptan (Imitrex) for bad migraines. Take 1 tablet at migraine onset, may repeat with second tablet 2 hours later if needed. Do not exceed 2 tabs in 24 hours. This may cause drowsiness.  Schedule a follow up visit for 1 month for headaches/anxiety.  It was a pleasure to see you today!

## 2023-03-28 NOTE — Assessment & Plan Note (Addendum)
Uncontrolled.  Intolerant to Lexapro. Already tried Zoloft.  Will start propranolol ER 80 mg HS for migraine/headache prevention. This may help with physical symptoms of anxiety.  Consider fluoxetine.  Checking TSH and Free T4 today.  Close follow up in 1 month.

## 2023-03-28 NOTE — Telephone Encounter (Signed)
Called and spoke with patient she states both medication are too expensive. They are costing $175 for each medication for 10 pills.  She is requesting any other alternative that would help but would not be as costly

## 2023-03-28 NOTE — Progress Notes (Signed)
Subjective:    Patient ID: Stephanie [name], female    DOB: Jun 12, 1996, 27 y.o.   MRN: [id number]  Headache     Stephanie [name] is a very pleasant 27 y.o. female with a history of migraines, near syncope, dysmenorrhea, menorrhagia, MDD, GAD who presents today for ED follow up and to discuss anxiety.   She presented to [facility] yesterday with a 2 day history of right sided headache without head trauma. She also noticed lightheadedness and dizziness. During her stay in the ED she received a migraine cocktail (Compazine, Benadryl, Toradol) IV. Labs including urine pregnancy, UA, and blood work were grossly negative. Symptoms completely resolved so she was discharged home later that evening.   Since her ED visit she continues to notice a headache to her right frontal, parietal, and occipital lobes.   She experiences either a headache or migraine at least three times weekly with symptoms of photophobia, phonophobia, and nausea. She's experienced frequent headaches and migraines as long as she can remember. She historically takes Ibuprofen and Tylenol for headaches which resolves headaches until now.   She was prescribed Topamax in 2015 per neurology for headaches, doesn't recall taking this or if she had a reaction.   She's continued to struggle with anxiety. Symptoms include sweating, palpitations, daily worry, feeling overwhelmed, tremors, feeling restless, irritability. She was initiated on Lexapro in September 2022 but this caused her visual changes. She has tried Zoloft years ago, doesn't recall this.  Review of Systems  Cardiovascular:  Positive for palpitations.  Neurological:  Positive for tremors and headaches.  Psychiatric/Behavioral:  The patient is nervous/anxious.          Past Medical History:  Diagnosis Date   Acute pancreatitis without infection or necrosis 04/21/2019   Anemia    Anxiety 01/28/2013   no meds   Depression    no meds   IBS (irritable bowel syndrome)     Migraines    HA every other day - otc med prn   Recurrent streptococcal tonsillitis    Seasonal allergies    Seasonal environmental allergies   Sebaceous cyst 08/04/2018   L mid back    Vision abnormalities    Myopia; patient has history of devloping ulcers when she wore contacts.    Social History   Socioeconomic History   Marital status: Single    Spouse name: Not on file   Number of children: 0   Years of education: Not on file   Highest education level: Not on file  Occupational History   Occupation: Nanny  Tobacco Use   Smoking status: Never   Smokeless tobacco: Never  Vaping Use   Vaping Use: Never used  Substance and Sexual Activity   Alcohol use: Yes    Comment: socially   Drug use: Yes    Types: Marijuana    Comment: Last use Thursday 10/16/18   Sexual activity: Not Currently    Partners: Male    Birth control/protection: Implant    Comment: broke up with first sexual partner 1 month ago  Other Topics Concern   Not on file  Social History Narrative   Single.    Student at [employer].   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    Enjoys listening to music, hanging out with friends.    Social Determinants of Health   Financial Resource Strain: Not on file  Food Insecurity: Not on file  Transportation Needs: Not on file  Physical Activity: Not on file  Stress: Not on file  Social Connections: Not on file  Intimate Partner Violence: Not on file    Past Surgical History:  Procedure Laterality Date   COLONOSCOPY     IBS   COLONOSCOPY     IBS   IRRIGATION AND DEBRIDEMENT ABSCESS Right 12/07/2021   Procedure: IRRIGATION AND DEBRIDEMENT AXILLARY ABSCESS;  Surgeon: Axel Filler, MD;  Location: [facility] OR;  Service: General;  Laterality: Right;   LAPAROSCOPY N/A 10/20/2018   Procedure: LAPAROSCOPY DIAGNOSTIC possible removal of endometriosis;  Surgeon: Mitchel Honour, DO;  Location: [facility] ORS;  Service: Gynecology;  Laterality: N/A;   UPPER  GASTROINTESTINAL ENDOSCOPY  03/04/2020    Family History  Problem Relation Age of Onset   Depression Mother    Migraines Mother    Irritable bowel syndrome Mother    Hypertension Mother    Migraines Father    Diabetes Father    Diabetes Maternal Grandmother    Hypertension Maternal Grandmother    Diabetes Sister    Stomach cancer Sister 50   Colon cancer Neg Hx    Esophageal cancer Neg Hx    Rectal cancer Neg Hx     Allergies  Allergen Reactions   Almond (Diagnostic) Other (See Comments)    [place]   Coconut Flavor Other (See Comments)    Mouth itches, but no impaired breathing (per patient   Septra [Sulfamethoxazole-Trimethoprim] Nausea Only and Other (See Comments)    Patient took a few courses of this and it ended up being stopped by a MD because it had started causing "bad stomach pain" (per the patient)    Current Outpatient Medications on File Prior to Visit  Medication Sig Dispense Refill   acetaminophen (TYLENOL) 500 MG tablet Take 1,000 mg by mouth every 6 (six) hours as needed.     etonogestrel (NEXPLANON) 68 MG IMPL implant 1 each by Subdermal route continuous.     ibuprofen (ADVIL) 200 MG tablet Take 400-600 mg by mouth every 6 (six) hours as needed for headache or moderate pain.     ondansetron (ZOFRAN ODT) 4 MG disintegrating tablet Take 1-2 tablets (4-8 mg total) by mouth every 8 (eight) hours as needed for nausea or vomiting. 30 tablet 0   [DISCONTINUED] dicyclomine (BENTYL) 20 MG tablet Take 1 tablet (20 mg total) by mouth 2 (two) times daily. 20 tablet 0   [DISCONTINUED] famotidine (PEPCID) 20 MG tablet Take 1 tablet (20 mg total) by mouth 2 (two) times daily. 30 tablet 0   [DISCONTINUED] metoCLOPramide (REGLAN) 10 MG tablet Take 1 tablet (10 mg total) by mouth every 6 (six) hours as needed for nausea or vomiting. (Patient not taking: Reported on 09/19/2019) 30 tablet 0   [DISCONTINUED] pantoprazole (PROTONIX) 40 MG tablet Take 40 mg twice a day for 8 weeks  120 tablet 1   [DISCONTINUED] promethazine (PHENERGAN) 25 MG suppository Place 1 suppository (25 mg total) rectally every 6 (six) hours as needed for nausea or vomiting. (Patient not taking: Reported on 03/04/2020) 12 each 0   [DISCONTINUED] sucralfate (CARAFATE) 1 GM/10ML suspension Take 10 mLs (1 g total) by mouth 4 (four) times daily -  with meals and at bedtime. 420 mL 0   Current Facility-Administered Medications on File Prior to Visit  Medication Dose Route Frequency Provider Last Rate Last Admin   0.9 %  sodium chloride infusion  500 mL Intravenous Once Cirigliano, Vito V, DO        BP 136/72   Pulse (!) 110  Temp 98.1 F (36.7 C) (Temporal)   Ht 5\' 3"  (1.6 m)   Wt 125 lb (56.7 kg)   LMP 03/13/2023 (Exact Date)   SpO2 97%   BMI 22.14 kg/m  Objective:   Physical Exam Cardiovascular:     Rate and Rhythm: Normal rate and regular rhythm.  Pulmonary:     Effort: Pulmonary effort is normal.     Breath sounds: Normal breath sounds.  Musculoskeletal:     Cervical back: Neck supple.  Skin:    General: Skin is warm and dry.  Neurological:     Comments: Resting tremors noted to bilateral hands intermittently.            Assessment & Plan:  Chronic migraine without aura without status migrainosus, not intractable Assessment & Plan: Uncontrolled. ED notes, labs, imaging reviewed.   MRI brain from 2022 reviewed and is benign.   Start propranolol ER 80 mg daily for prevention of headaches/migraines. Start sumatriptan 50 mg PRN for abortion of migraines.   Toradol 60 mg IM provided today.  Close follow up in 1 month.  Orders: -     Propranolol HCl ER; Take 1 capsule (80 mg total) by mouth at bedtime. For headache prevention  Dispense: 90 capsule; Refill: 0 -     SUMAtriptan Succinate; Take 1 tablet by mouth at migraine onset. May repeat in 2 hours if headache persists or recurs.  Dispense: 10 tablet; Refill: 0  Generalized anxiety disorder Assessment &  Plan: Uncontrolled.  Intolerant to Lexapro. Already tried Zoloft.  Will start propranolol ER 80 mg HS for migraine/headache prevention. This may help with physical symptoms of anxiety.  Consider fluoxetine.  Checking TSH and Free T4 today.  Close follow up in 1 month.   Orders: -     TSH -     T4, free        Doreene Nest, NP

## 2023-03-28 NOTE — Addendum Note (Signed)
Addended by: Lonia Blood on: 03/28/2023 09:14 AM   Modules accepted: Orders

## 2023-03-28 NOTE — Telephone Encounter (Signed)
Which medication is she referring to? Are both medications too costly?  Both medications are generic.

## 2023-03-28 NOTE — Telephone Encounter (Signed)
Patient was prescribed  propranolol ER (INDERAL LA) 80 MG 24 hr capsule  SUMAtriptan (IMITREX) 50 MG tablet,she called to see if there was a generic brand that can be prescribed to her that may be cheaper,due to her not having insurance?

## 2023-03-28 NOTE — Assessment & Plan Note (Addendum)
Uncontrolled. ED notes, labs, imaging reviewed.   MRI brain from 2022 reviewed and is benign.   Start propranolol ER 80 mg daily for prevention of headaches/migraines. Start sumatriptan 50 mg PRN for abortion of migraines.   Toradol 60 mg IM provided today.  Close follow up in 1 month.

## 2023-03-29 MED ORDER — PROPRANOLOL HCL ER 80 MG PO CP24: 80.0000 mg | ORAL_CAPSULE | Freq: Every day | ORAL | 0 refills | Status: DC

## 2023-03-29 MED ORDER — SUMATRIPTAN SUCCINATE 50 MG PO TABS
ORAL_TABLET | ORAL | 0 refills | Status: DC
Start: 2023-03-29 — End: 2024-07-20

## 2023-03-29 NOTE — Telephone Encounter (Signed)
Noted, Rx's sent to pharmacy 

## 2023-03-29 NOTE — Telephone Encounter (Signed)
Called and spoke with patient, she will use the GoodRx coupon cards for the medications and would like the new Rxs sent to [facility]- [address] [place], [place], [place] [address]

## 2023-03-29 NOTE — Addendum Note (Signed)
Addended by: Doreene Nest on: 03/29/2023 03:50 PM   Modules accepted: Orders

## 2023-03-29 NOTE — Telephone Encounter (Signed)
I did a quick search on GoodRx.com and found that she could get 37-month supply of the propranolol ER 80 mg capsules at [facility] pharmacy for about $30.  She can get a pack of sumatriptan (Imitrex) at [facility] for about $26.  Have her download the GoodRx app on her phone.   Does she want me to send the prescriptions to a [facility] pharmacy?

## 2023-04-30 ENCOUNTER — Telehealth: Payer: Self-pay | Admitting: Primary Care

## 2023-05-02 ENCOUNTER — Encounter: Payer: Self-pay | Admitting: Primary Care

## 2023-05-02 ENCOUNTER — Telehealth (INDEPENDENT_AMBULATORY_CARE_PROVIDER_SITE_OTHER): Payer: Self-pay | Admitting: Primary Care

## 2023-05-02 DIAGNOSIS — G4452 New daily persistent headache (NDPH): Secondary | ICD-10-CM

## 2023-05-02 DIAGNOSIS — G43709 Chronic migraine without aura, not intractable, without status migrainosus: Secondary | ICD-10-CM

## 2023-05-02 DIAGNOSIS — F411 Generalized anxiety disorder: Secondary | ICD-10-CM

## 2023-05-02 MED ORDER — PROPRANOLOL HCL ER 120 MG PO CP24
120.0000 mg | ORAL_CAPSULE | Freq: Every day | ORAL | 0 refills | Status: DC
Start: 2023-05-02 — End: 2024-05-28

## 2023-05-02 MED ORDER — FLUOXETINE HCL 20 MG PO TABS
20.0000 mg | ORAL_TABLET | Freq: Every day | ORAL | 0 refills | Status: DC
Start: 2023-05-02 — End: 2024-05-28

## 2023-05-02 NOTE — Patient Instructions (Signed)
We increased the dose of your propranolol to 120 mg daily for headache prevention.  I will send a new prescription to your pharmacy.  Start fluoxetine (Prozac) 20 mg daily.  Take 1/2 tablet by mouth daily for 1 week, then increase to 1 full tablet thereafter.  Please update me via MyChart in 4 weeks.  It was a pleasure to see you today!

## 2023-05-02 NOTE — Assessment & Plan Note (Signed)
Improving but not at goal.  Increase propranolol ER to 120 mg nightly. Continue sumatriptan 50 mg as needed.  She will update via MyChart in 4 weeks.

## 2023-05-02 NOTE — Assessment & Plan Note (Signed)
Uncontrolled.  She has failed Lexapro and Zoloft, so we will start fluoxetine 20 mg daily.  She will start with 1/2 tablet daily x 1 week, then increase to 1 full tablet thereafter.  She will update via MyChart in 4 weeks.

## 2023-05-02 NOTE — Progress Notes (Signed)
Patient ID: Stephanie [name], female    DOB: 17-Sep-1996, 27 y.o.   MRN: [id number]  Virtual visit completed through [employer], a video enabled telemedicine application. Due to national recommendations of social distancing due to COVID-19, a virtual visit is felt to be most appropriate for this patient at this time. Reviewed limitations, risks, security and privacy concerns of performing a virtual visit and the availability of in person appointments. I also reviewed that there may be a patient responsible charge related to this service. The patient agreed to proceed.   Patient location: home Provider location: [place] [facility], office Persons participating in this virtual visit: patient, provider   If any vitals were documented, they were collected by patient at home unless specified below.    LMP 04/07/2023 (Exact Date)    CC: Follow up for Anxiety and headaches Subjective:   HPI: KAJSA [name] is a 27 y.o. female presenting on 05/02/2023 for Medical Management of Chronic Issues (1 mo follow up for headache and anxiety )  She does not have medical insurance at this time.  She was last evaluated on 03/28/2023 for an ED follow up for migraine headaches and to discuss anxiety.  During this visit her migraines were occurring at least 3 times weekly with symptoms of photophobia, phono phobia, nausea.  She also mentioned ongoing anxiety symptoms including palpitations, worrying, feeling overwhelmed, restlessness, irritability.  During her last visit we initiated propranolol ER daily for migraine/headache prevention and sumatriptan 50 mg as needed for migraine abortion.  She had failed Zoloft and Lexapro previously for anxiety so we decided to try propranolol for headache prevention and some of her physical symptoms of anxiety.  She is here for follow-up today.  Since her last visit she is compliant to propranolol ER 80 mg nightly. She has noticed not waking up with headaches in  the morning and her headaches have been less intense. She is experiencing headaches 1 times weekly and migraines are occurring 2-3 times weekly. Headaches are mostly located to the right temporal lobe.  Her migraines have not been bad enough to where she has had to use the sumatriptan.  She continues to experience recurrent anxiety, and there has been no improvement since starting propranolol.  She continues to feel anxious daily with worrying, irritability, restlessness.  She is open to trying treatment.      Relevant past medical, surgical, family and social history reviewed and updated as indicated. Interim medical history since our last visit reviewed. Allergies and medications reviewed and updated. Outpatient Medications Prior to Visit  Medication Sig Dispense Refill   acetaminophen (TYLENOL) 500 MG tablet Take 1,000 mg by mouth every 6 (six) hours as needed.     etonogestrel (NEXPLANON) 68 MG IMPL implant 1 each by Subdermal route continuous.     SUMAtriptan (IMITREX) 50 MG tablet Take 1 tablet by mouth at migraine onset. May repeat in 2 hours if headache persists or recurs. 10 tablet 0   ibuprofen (ADVIL) 200 MG tablet Take 400-600 mg by mouth every 6 (six) hours as needed for headache or moderate pain.     propranolol ER (INDERAL LA) 80 MG 24 hr capsule Take 1 capsule (80 mg total) by mouth at bedtime. For headache prevention 90 capsule 0   ondansetron (ZOFRAN ODT) 4 MG disintegrating tablet Take 1-2 tablets (4-8 mg total) by mouth every 8 (eight) hours as needed for nausea or vomiting. (Patient not taking: Reported on 05/02/2023) 30 tablet 0  Facility-Administered Medications Prior to Visit  Medication Dose Route Frequency Provider Last Rate Last Admin   0.9 %  sodium chloride infusion  500 mL Intravenous Once Cirigliano, Vito V, DO         Per HPI unless specifically indicated in ROS section below Review of Systems  Eyes:  Positive for photophobia.  Cardiovascular:  Negative for  palpitations.  Neurological:  Positive for headaches.  Psychiatric/Behavioral:  The patient is nervous/anxious.    Objective:  LMP 04/07/2023 (Exact Date)   Wt Readings from Last 3 Encounters:  03/28/23 125 lb (56.7 kg)  03/27/23 131 lb (59.4 kg)  10/05/22 135 lb 2 oz (61.3 kg)       Physical exam: General: Alert and oriented x 3, no distress, does not appear sickly  Pulmonary: Speaks in complete sentences without increased work of breathing, no cough during visit.  Psychiatric: Normal mood, thought content, and behavior.     Results for orders placed or performed in visit on 03/28/23  TSH  Result Value Ref Range   TSH 0.73 0.35 - 5.50 uIU/mL  T4, free  Result Value Ref Range   Free T4 1.30 0.60 - 1.60 ng/dL   Assessment & Plan:   Problem List Items Addressed This Visit       Cardiovascular and Mediastinum   Migraines (Chronic)    Improving but not at goal.  Increase propranolol ER to 120 mg nightly. Continue sumatriptan 50 mg as needed.  She will update via MyChart in 4 weeks.      Relevant Medications   propranolol ER (INDERAL LA) 120 MG 24 hr capsule   FLUoxetine (PROZAC) 20 MG tablet     Other   Generalized anxiety disorder - Primary (Chronic)    Uncontrolled.  She has failed Lexapro and Zoloft, so we will start fluoxetine 20 mg daily.  She will start with 1/2 tablet daily x 1 week, then increase to 1 full tablet thereafter.  She will update via MyChart in 4 weeks.      Relevant Medications   FLUoxetine (PROZAC) 20 MG tablet   Other Visit Diagnoses     New daily persistent headache       Relevant Medications   propranolol ER (INDERAL LA) 120 MG 24 hr capsule   FLUoxetine (PROZAC) 20 MG tablet        Meds ordered this encounter  Medications   propranolol ER (INDERAL LA) 120 MG 24 hr capsule    Sig: Take 1 capsule (120 mg total) by mouth at bedtime. For headache prevention    Dispense:  90 capsule    Refill:  0    Order Specific Question:    Supervising Provider    Answer:   BEDSOLE, AMY E [[id number]]   FLUoxetine (PROZAC) 20 MG tablet    Sig: Take 1 tablet (20 mg total) by mouth daily. For anxiety    Dispense:  90 tablet    Refill:  0    Order Specific Question:   Supervising Provider    Answer:   BEDSOLE, AMY E [[id number]]   No orders of the defined types were placed in this encounter.   I discussed the assessment and treatment plan with the patient. The patient was provided an opportunity to ask questions and all were answered. The patient agreed with the plan and demonstrated an understanding of the instructions. The patient was advised to call back or seek an in-person evaluation if the symptoms worsen or if the condition fails  to improve as anticipated.  Follow up plan:  We increased the dose of your propranolol to 120 mg daily for headache prevention.  I will send a new prescription to your pharmacy.  Start fluoxetine (Prozac) 20 mg daily.  Take 1/2 tablet by mouth daily for 1 week, then increase to 1 full tablet thereafter.  Please update me via MyChart in 4 weeks.  It was a pleasure to see you today!   Doreene Nest, NP

## 2023-05-13 ENCOUNTER — Ambulatory Visit (INDEPENDENT_AMBULATORY_CARE_PROVIDER_SITE_OTHER): Payer: Self-pay | Admitting: Internal Medicine

## 2023-05-13 ENCOUNTER — Encounter: Payer: Self-pay | Admitting: Internal Medicine

## 2023-05-13 ENCOUNTER — Telehealth: Payer: Self-pay

## 2023-05-13 VITALS — BP 110/66 | HR 103 | Temp 98.1°F | Ht 63.0 in | Wt 134.0 lb

## 2023-05-13 DIAGNOSIS — L02411 Cutaneous abscess of right axilla: Secondary | ICD-10-CM

## 2023-05-13 MED ORDER — CEFTRIAXONE SODIUM 1 G IJ SOLR
1.0000 g | Freq: Once | INTRAMUSCULAR | Status: AC
Start: 2023-05-13 — End: 2023-05-13
  Administered 2023-05-13: 1 g via INTRAMUSCULAR

## 2023-05-13 MED ORDER — DOXYCYCLINE HYCLATE 100 MG PO TABS
100.0000 mg | ORAL_TABLET | Freq: Two times a day (BID) | ORAL | 1 refills | Status: DC
Start: 2023-05-13 — End: 2023-06-28

## 2023-05-13 NOTE — Progress Notes (Signed)
Subjective:    Patient ID: Stephanie [name], female    DOB: [birth year], 27 y.o.   MRN: [id number]  HPI Here due to painful lumps under her arms  1 week ago---noticed painful mass in left armpit Has drained some Not as painful but getting bigger Some itching  Noticed one on right side--2-3 days later Much more painful Affects her being able to move right arm ?some hand swelling  Warm compresses Ibuprofen has helped the pain very slightly  Does have hydradenitis suppurativa Needed past I&Ds  Current Outpatient Medications on File Prior to Visit  Medication Sig Dispense Refill   acetaminophen (TYLENOL) 500 MG tablet Take 1,000 mg by mouth every 6 (six) hours as needed.     etonogestrel (NEXPLANON) 68 MG IMPL implant 1 each by Subdermal route continuous.     FLUoxetine (PROZAC) 20 MG tablet Take 1 tablet (20 mg total) by mouth daily. For anxiety 90 tablet 0   ondansetron (ZOFRAN ODT) 4 MG disintegrating tablet Take 1-2 tablets (4-8 mg total) by mouth every 8 (eight) hours as needed for nausea or vomiting. 30 tablet 0   propranolol ER (INDERAL LA) 120 MG 24 hr capsule Take 1 capsule (120 mg total) by mouth at bedtime. For headache prevention 90 capsule 0   SUMAtriptan (IMITREX) 50 MG tablet Take 1 tablet by mouth at migraine onset. May repeat in 2 hours if headache persists or recurs. 10 tablet 0   [DISCONTINUED] dicyclomine (BENTYL) 20 MG tablet Take 1 tablet (20 mg total) by mouth 2 (two) times daily. 20 tablet 0   [DISCONTINUED] famotidine (PEPCID) 20 MG tablet Take 1 tablet (20 mg total) by mouth 2 (two) times daily. 30 tablet 0   [DISCONTINUED] metoCLOPramide (REGLAN) 10 MG tablet Take 1 tablet (10 mg total) by mouth every 6 (six) hours as needed for nausea or vomiting. (Patient not taking: Reported on 09/19/2019) 30 tablet 0   [DISCONTINUED] pantoprazole (PROTONIX) 40 MG tablet Take 40 mg twice a day for 8 weeks 120 tablet 1   [DISCONTINUED] promethazine (PHENERGAN) 25 MG  suppository Place 1 suppository (25 mg total) rectally every 6 (six) hours as needed for nausea or vomiting. (Patient not taking: Reported on 03/04/2020) 12 each 0   [DISCONTINUED] sucralfate (CARAFATE) 1 GM/10ML suspension Take 10 mLs (1 g total) by mouth 4 (four) times daily -  with meals and at bedtime. 420 mL 0   Current Facility-Administered Medications on File Prior to Visit  Medication Dose Route Frequency Provider Last Rate Last Admin   0.9 %  sodium chloride infusion  500 mL Intravenous Once Cirigliano, Vito V, DO        Allergies  Allergen Reactions   Almond (Diagnostic) Other (See Comments)    [place]   Coconut Flavor Other (See Comments)    Mouth itches, but no impaired breathing (per patient   Septra [Sulfamethoxazole-Trimethoprim] Nausea Only and Other (See Comments)    Patient took a few courses of this and it ended up being stopped by a MD because it had started causing "bad stomach pain" (per the patient)    Past Medical History:  Diagnosis Date   Acute pancreatitis without infection or necrosis 04/21/2019   Anemia    Anxiety 01/28/2013   no meds   Depression    no meds   IBS (irritable bowel syndrome)    Migraines    HA every other day - otc med prn   Recurrent streptococcal tonsillitis    Seasonal allergies  Seasonal environmental allergies   Sebaceous cyst 08/04/2018   L mid back    Vision abnormalities    Myopia; patient has history of devloping ulcers when she wore contacts.    Past Surgical History:  Procedure Laterality Date   COLONOSCOPY     IBS   COLONOSCOPY     IBS   IRRIGATION AND DEBRIDEMENT ABSCESS Right 12/07/2021   Procedure: IRRIGATION AND DEBRIDEMENT AXILLARY ABSCESS;  Surgeon: Axel Filler, MD;  Location: [facility] OR;  Service: General;  Laterality: Right;   LAPAROSCOPY N/A 10/20/2018   Procedure: LAPAROSCOPY DIAGNOSTIC possible removal of endometriosis;  Surgeon: Mitchel Honour, DO;  Location: [facility] ORS;  Service: Gynecology;  Laterality: N/A;    UPPER GASTROINTESTINAL ENDOSCOPY  03/04/2020    Family History  Problem Relation Age of Onset   Depression Mother    Migraines Mother    Irritable bowel syndrome Mother    Hypertension Mother    Migraines Father    Diabetes Father    Diabetes Maternal Grandmother    Hypertension Maternal Grandmother    Diabetes Sister    Stomach cancer Sister 55   Colon cancer Neg Hx    Esophageal cancer Neg Hx    Rectal cancer Neg Hx     Social History   Socioeconomic History   Marital status: Single    Spouse name: Not on file   Number of children: 0   Years of education: Not on file   Highest education level: Not on file  Occupational History   Occupation: Nanny  Tobacco Use   Smoking status: Never   Smokeless tobacco: Never  Vaping Use   Vaping Use: Never used  Substance and Sexual Activity   Alcohol use: Yes    Comment: socially   Drug use: Yes    Types: Marijuana    Comment: Last use Thursday 10/16/18   Sexual activity: Not Currently    Partners: Male    Birth control/protection: Implant    Comment: broke up with first sexual partner 1 month ago  Other Topics Concern   Not on file  Social History Narrative   Single.    Student at [employer].   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    Enjoys listening to music, hanging out with friends.    Social Determinants of Health   Financial Resource Strain: Not on file  Food Insecurity: Not on file  Transportation Needs: Not on file  Physical Activity: Not on file  Stress: Not on file  Social Connections: Not on file  Intimate Partner Violence: Not on file   Review of Systems Slight fever 2-3 days ago Doesn't feel sick Tries not to shave axilla--uses nair (last ~ 3 weeks ago)     Objective:   Physical Exam Constitutional:      Appearance: Normal appearance.  Genitourinary:    Comments: Tender masses in both axilla--right much worse than left Pain with any right arm abduction Neurological:     Mental  Status: She is alert.            Assessment & Plan:

## 2023-05-13 NOTE — Assessment & Plan Note (Signed)
Very painful quickly Exam shows 2-3cm markedly tender, mildly fluctuant mass Discussed warm compresses to try to achieve drainage Rocephin 1gm x 1 today Doxy 100 bid  May need surgical evaluation again

## 2023-05-13 NOTE — Addendum Note (Signed)
Addended by: Eual Fines on: 05/13/2023 11:50 AM   Modules accepted: Orders

## 2023-05-13 NOTE — Telephone Encounter (Signed)
Per appt notes pt already has appt scheduled with Dr Alphonsus Sias on 05/13/23 at 11:15.sending note to Dr Alphonsus Sias and Alphonsus Sias pool.

## 2023-05-13 NOTE — Telephone Encounter (Signed)
Okay---I will assess her at the OV

## 2023-05-15 ENCOUNTER — Emergency Department (HOSPITAL_BASED_OUTPATIENT_CLINIC_OR_DEPARTMENT_OTHER)
Admission: EM | Admit: 2023-05-15 | Discharge: 2023-05-15 | Disposition: A | Discharge status: Home / Self Care | Payer: Self-pay | Attending: Emergency Medicine | Admitting: Emergency Medicine

## 2023-05-15 ENCOUNTER — Encounter (HOSPITAL_BASED_OUTPATIENT_CLINIC_OR_DEPARTMENT_OTHER): Payer: Self-pay

## 2023-05-15 ENCOUNTER — Telehealth (HOSPITAL_BASED_OUTPATIENT_CLINIC_OR_DEPARTMENT_OTHER): Payer: Self-pay | Admitting: Emergency Medicine

## 2023-05-15 ENCOUNTER — Other Ambulatory Visit: Payer: Self-pay

## 2023-05-15 ENCOUNTER — Other Ambulatory Visit (HOSPITAL_BASED_OUTPATIENT_CLINIC_OR_DEPARTMENT_OTHER): Payer: Self-pay

## 2023-05-15 ENCOUNTER — Emergency Department (HOSPITAL_BASED_OUTPATIENT_CLINIC_OR_DEPARTMENT_OTHER): Payer: Self-pay

## 2023-05-15 DIAGNOSIS — L732 Hidradenitis suppurativa: Secondary | ICD-10-CM | POA: Insufficient documentation

## 2023-05-15 HISTORY — DX: Hidradenitis suppurativa: L73.2

## 2023-05-15 LAB — CBC
HCT: 38.7 % (ref 36.0–46.0)
Hemoglobin: 13 g/dL (ref 12.0–15.0)
MCH: 31.6 pg (ref 26.0–34.0)
MCHC: 33.6 g/dL (ref 30.0–36.0)
MCV: 93.9 fL (ref 80.0–100.0)
Platelets: 243 10*3/uL (ref 150–400)
RBC: 4.12 MIL/uL (ref 3.87–5.11)
RDW: 12.4 % (ref 11.5–15.5)
WBC: 5.1 10*3/uL (ref 4.0–10.5)
nRBC: 0 % (ref 0.0–0.2)

## 2023-05-15 LAB — BASIC METABOLIC PANEL
Anion gap: 5 (ref 5–15)
BUN: 10 mg/dL (ref 6–20)
CO2: 28 mmol/L (ref 22–32)
Calcium: 9.4 mg/dL (ref 8.9–10.3)
Chloride: 105 mmol/L (ref 98–111)
Creatinine, Ser: 0.83 mg/dL (ref 0.44–1.00)
GFR, Estimated: >60 mL/min (ref >60)
Glucose, Bld: 89 mg/dL (ref 70–99)
Potassium: 4.4 mmol/L (ref 3.5–5.1)
Sodium: 138 mmol/L (ref 135–145)

## 2023-05-15 MED ORDER — IOHEXOL 300 MG/ML  SOLN
100.0000 mL | Freq: Once | INTRAMUSCULAR | Status: AC | PRN
  Administered 2023-05-15: 60 mL via INTRAVENOUS

## 2023-05-15 MED ORDER — CLINDAMYCIN PHOSPHATE 1 % EX GEL: Freq: Two times a day (BID) | CUTANEOUS | 0 refills | Status: DC

## 2023-05-15 MED ORDER — HYDROCODONE-ACETAMINOPHEN 5-325 MG PO TABS
1.0000 | ORAL_TABLET | Freq: Once | ORAL | Status: AC
  Administered 2023-05-15: 1 via ORAL
  Filled 2023-05-15: qty 1

## 2023-05-15 MED ORDER — HYDROCODONE-ACETAMINOPHEN 5-325 MG PO TABS: 1.0000 | ORAL_TABLET | Freq: Four times a day (QID) | ORAL | 0 refills | Status: DC | PRN

## 2023-05-15 NOTE — Discharge Instructions (Addendum)
Please return to the ED with any new or worsening signs or symptoms Please follow-up with [facility] on 6/28 at 10:45 AM. Please continue taking doxycycline prescribed by PCP.  Please begin utilizing topical clindamycin which she will apply underneath both armpits 2 times a day.   Please read the attached guide concerning hidradenitis suppurativa Please utilize warm compresses to both underarm areas  Please attempted control your pain with ibuprofen or Tylenol.  If this does not relieve pain, you may take hydrocodone pain medication I prescribed you.  Please be aware that this will cause you to become lightheaded and dizzy so do not drive or operate heavy machinery under the influence of medication.  Please also be aware that pain medication may cause constipation so if you begin to experience that she may purchase MiraLAX.

## 2023-05-15 NOTE — ED Notes (Signed)
Pt given discharge instructions and reviewed prescriptions. Opportunities given for questions. Pt verbalizes understanding. PIV removed x1. Baine Decesare R, RN 

## 2023-05-15 NOTE — ED Provider Notes (Signed)
[facility] EMERGENCY DEPARTMENT AT [facility] Provider Note   CSN: [id number] Arrival date & time: 05/15/23  0827     History  Chief Complaint  Patient presents with   Abscess    Stephanie [name] is a 27 y.o. female with medical history of anxiety, anemia, depression, migraines, hidradenitis suppurativa.  Patient presents to ED for evaluation of bilateral axillary pain.  The patient reports that 1.5 weeks ago she developed a pea-sized lump to her left axilla consistent with past episodes of [employer] supports either.  Patient reports that this abscess began draining at 1 point but has not drained since that time and has progressively gotten bigger.  The patient goes on to state that about 4 days ago she noticed pain under her right axilla as well.  She states that she was seen by her PCP 2 days ago and was provided Rocephin as well as doxycycline prescription.  The patient was advised by her PCP that she may need to follow-up with [facility] for possible surgical removal.  The patient states that last January 2023 she had surgery to address her hidradenitis suppurativa.  She states that she attempted to call [facility] but they did not call her back and so she came here due to the pain.  She states that she is having issues with raising her right arm secondary to the pain.  She denies fevers, nausea, vomiting, body aches or chills. States at one point she did have drainage from left axilla but none from right.   Abscess      Home Medications Prior to Admission medications   Medication Sig Start Date End Date Taking? Authorizing Provider  clindamycin (CLINDAGEL) 1 % gel Apply topically 2 (two) times daily. 05/15/23  Yes Al Decant, PA-C  acetaminophen (TYLENOL) 500 MG tablet Take 1,000 mg by mouth every 6 (six) hours as needed.    [provider]  doxycycline (VIBRA-TABS) 100 MG tablet Take 1 tablet (100 mg total) by mouth 2 (two)  times daily. 05/13/23   Karie Schwalbe, MD  etonogestrel (NEXPLANON) 68 MG IMPL implant 1 each by Subdermal route continuous. 09/30/18   [provider]  FLUoxetine (PROZAC) 20 MG tablet Take 1 tablet (20 mg total) by mouth daily. For anxiety 05/02/23   Doreene Nest, NP  ondansetron (ZOFRAN ODT) 4 MG disintegrating tablet Take 1-2 tablets (4-8 mg total) by mouth every 8 (eight) hours as needed for nausea or vomiting. 05/25/21   Anyanwu, Jethro Bastos, MD  propranolol ER (INDERAL LA) 120 MG 24 hr capsule Take 1 capsule (120 mg total) by mouth at bedtime. For headache prevention 05/02/23   Doreene Nest, NP  SUMAtriptan (IMITREX) 50 MG tablet Take 1 tablet by mouth at migraine onset. May repeat in 2 hours if headache persists or recurs. 03/29/23   Doreene Nest, NP  dicyclomine (BENTYL) 20 MG tablet Take 1 tablet (20 mg total) by mouth 2 (two) times daily. 10/26/19 05/17/20  Joy, Shawn C, PA-C  famotidine (PEPCID) 20 MG tablet Take 1 tablet (20 mg total) by mouth 2 (two) times daily. 02/07/20 05/17/20  Henderly, Britni A, PA-C  metoCLOPramide (REGLAN) 10 MG tablet Take 1 tablet (10 mg total) by mouth every 6 (six) hours as needed for nausea or vomiting. Patient not taking: Reported on 09/19/2019 06/19/19 09/19/19  Caccavale, Sophia, PA-C  pantoprazole (PROTONIX) 40 MG tablet Take 40 mg twice a day for 8 weeks 03/04/20 05/17/20  Cirigliano,  Vito V, DO  promethazine (PHENERGAN) 25 MG suppository Place 1 suppository (25 mg total) rectally every 6 (six) hours as needed for nausea or vomiting. Patient not taking: Reported on 03/04/2020 02/07/20 05/17/20  Henderly, Britni A, PA-C  sucralfate (CARAFATE) 1 GM/10ML suspension Take 10 mLs (1 g total) by mouth 4 (four) times daily -  with meals and at bedtime. 02/07/20 05/17/20  Henderly, Britni A, PA-C      Allergies    Almond (diagnostic), Coconut flavor, and Septra [sulfamethoxazole-trimethoprim]    Review of Systems   Review of Systems  Skin:         Abscesses  All other systems reviewed and are negative.   Physical Exam Updated Vital Signs BP 109/80 (BP Location: Right Arm)   Pulse 83   Temp 98.5 F (36.9 C) (Oral)   Resp 16   Ht 5\' 3"  (1.6 m)   Wt 56.7 kg   LMP 04/07/2023 (Exact Date)   SpO2 100%   BMI 22.14 kg/m  Physical Exam Vitals and nursing note reviewed.  Constitutional:      General: She is not in acute distress.    Appearance: Normal appearance. She is not ill-appearing, toxic-appearing or diaphoretic.  HENT:     Head: Normocephalic and atraumatic.     Nose: Nose normal.     Mouth/Throat:     Mouth: Mucous membranes are moist.     Pharynx: Oropharynx is clear.  Eyes:     Extraocular Movements: Extraocular movements intact.     Conjunctiva/sclera: Conjunctivae normal.     Pupils: Pupils are equal, round, and reactive to light.  Cardiovascular:     Rate and Rhythm: Normal rate and regular rhythm.  Pulmonary:     Effort: Pulmonary effort is normal.     Breath sounds: Normal breath sounds. No wheezing.  Abdominal:     General: Abdomen is flat. Bowel sounds are normal.     Palpations: Abdomen is soft.     Tenderness: There is no abdominal tenderness.  Musculoskeletal:     Cervical back: Normal range of motion and neck supple. No tenderness.  Skin:    General: Skin is warm and dry.     Capillary Refill: Capillary refill takes less than 2 seconds.     Comments: Bilateral axillary area assessed.  No obvious fluctuance, induration, erythema indicating abscess.  There is extreme tenderness to palpation of both of her axillary areas  Neurological:     Mental Status: She is alert and oriented to person, place, and time.     ED Results / Procedures / Treatments   Labs (all labs ordered are listed, but only abnormal results are displayed) Labs Reviewed  CBC  BASIC METABOLIC PANEL    EKG None  Radiology CT Chest W Contrast  Result Date: 05/15/2023 CLINICAL DATA:  Assess for deep abscess of the  bilateral axilla. EXAM: CT CHEST WITH CONTRAST TECHNIQUE: Multidetector CT imaging of the chest was performed during intravenous contrast administration. RADIATION DOSE REDUCTION: This exam was performed according to the departmental dose-optimization program which includes automated exposure control, adjustment of the mA and/or kV according to patient size and/or use of iterative reconstruction technique. CONTRAST:  60mL OMNIPAQUE IOHEXOL 300 MG/ML  SOLN COMPARISON:  December 07, 2021 FINDINGS: Cardiovascular: No significant vascular findings. Normal heart size. No pericardial effusion. Mediastinum/Nodes: No enlarged mediastinal, hilar, or axillary lymph nodes. Thyroid gland, trachea, and esophagus demonstrate no significant findings. Lungs/Pleura: Lungs are clear. No pleural effusion or pneumothorax. Upper  Abdomen: No acute abnormality. Musculoskeletal: No acute or significant osseous findings. There is bilateral axillary skin thickening. There is cutaneous/subcutaneous collection in the right axilla which measures 3.0 x 1.5 cm. Similar cutaneous/subcutaneous collection is seen in the left axilla measuring 3.6 x 1.1 cm. IMPRESSION: 1. Bilateral axillary skin thickening with cutaneous/subcutaneous collections in the right axilla measuring 3.0 x 1.5 cm and in the left axilla measuring 3.6 x 1.1 cm. 2. No evidence of acute cardiopulmonary disease. Electronically Signed   By: Ted Mcalpine M.D.   On: 05/15/2023 11:15    Procedures Procedures   Medications Ordered in ED Medications  HYDROcodone-acetaminophen (NORCO/VICODIN) 5-325 MG per tablet 1 tablet (1 tablet Oral Given 05/15/23 1021)  iohexol (OMNIPAQUE) 300 MG/ML solution 100 mL (60 mLs Intravenous Contrast Given 05/15/23 1101)    ED Course/ Medical Decision Making/ A&P    Medical Decision Making  27 year old female presents to ED for evaluation.  Please see HPI for further details.  On examination the patient is afebrile and nontachycardic.   Her lung sounds are clear bilaterally and she is not hypoxic.  Abdomen soft and compressible throughout.  Neurological examination at baseline.  Bilateral axillas assessed.  No obvious induration, erythema, fluctuance.  There is extreme tenderness to palpation especially left axillary fold however no obvious abscess I am able to appreciate on my visual inspection.  Chart reviewed, patient had CT scan of chest with contrast to assess for abscesses and bilateral axillas back in 2023.  Will proceed with this plan to assess for potential developing abscesses in bilateral axilla that are unable to be appreciated on visual inspection.  Will collect labs include CBC, BMP.  Will give her pain medicine as well.  CBC without leukocytosis or anemia.  Metabolic panel without electrolyte derangement, anion gap 5, no elevated creatinine.  Patient CT chest with contrast shows bilateral axillary skin thickening with cutaneous/subcutaneous collections in the right axilla measuring 3.0 x 1.5 cm in the left axilla measuring 3.6 x 1.1 cm.  Recheck of [facility].  Remi Haggard return my call stating that they would see the patient in clinic on Friday.  Tresa Endo I suggested placing patient on top clindamycin as well as advising her to continue taking oral doxycycline as well as increasing warm compresses.  These recommendations were relayed to the patient who voices understanding.  Patient sent home with clindamycin, instructions to increase warm compresses.  Patient will be seen on Friday by [facility] surgery for possible incision and drainage or surgical management.  All questions of the patient answered to her satisfaction.  She is stable to discharge home at this time.   Final Clinical Impression(s) / ED Diagnoses Final diagnoses:  Hidradenitis suppurativa    Rx / DC Orders ED Discharge Orders          Ordered    clindamycin (CLINDAGEL) 1 % gel  2 times daily        05/15/23 1313               Al Decant, [place] 05/15/23 1319    Tegeler, Canary Brim, MD 05/15/23 [phone]

## 2023-05-15 NOTE — ED Triage Notes (Signed)
Patient arrives with complaints of worsening pain under both of her arms related to abscesses (hx of Hidradenitis). Patient states that this a recurrent issue and the pain has has worsened. She does reports a low-grade fever (99.9) a few days ago as well. Rates pain a 9/10.

## 2023-05-16 ENCOUNTER — Telehealth: Payer: Self-pay

## 2023-05-16 NOTE — Transitions of Care (Post Inpatient/ED Visit) (Signed)
05/16/2023  Name: Stephanie [name] MRN: [id number] DOB: 1996/06/22  Today's TOC FU Call Status: Today's TOC FU Call Status:: Successful TOC FU Call Competed TOC FU Call Complete Date: 05/16/23  Transition Care Management Follow-up Telephone Call Date of Discharge: 05/15/23 Discharge Facility: [facility] ([facility]) Type of Discharge: Emergency Department Reason for ED Visit:  (Hidradenitis suppurativa) How have you been since you were released from the hospital?: Same Any questions or concerns?: No  Items Reviewed: Did you receive and understand the discharge instructions provided?: Yes Medications obtained,verified, and reconciled?: Yes (Medications Reviewed) Any new allergies since your discharge?: No Dietary orders reviewed?: Yes Do you have support at home?: Yes  Medications Reviewed Today: Medications Reviewed Today     Reviewed by Merleen Nicely, LPN (Licensed Practical Nurse) on 05/16/23 at 1217  Med List Status: <None>   Medication Order Taking? Sig Documenting Provider Last Dose Status Informant  0.9 %  sodium chloride infusion [id number]   Cirigliano, Vito V, DO  Active   acetaminophen (TYLENOL) 500 MG tablet [id number] Yes Take 1,000 mg by mouth every 6 (six) hours as needed. [provider] Taking Active Self  clindamycin (CLINDAGEL) 1 % gel [id number] Yes Apply topically 2 (two) times daily. Al Decant, PA-C Taking Active     Discontinued 05/17/20 1103 doxycycline (VIBRA-TABS) 100 MG tablet [id number] Yes Take 1 tablet (100 mg total) by mouth 2 (two) times daily. Karie Schwalbe, MD Taking Active   etonogestrel (NEXPLANON) 68 MG IMPL implant [id number] Yes 1 each by Subdermal route continuous. [provider] Taking Active Self           Med Note Lenor Derrick   Mon Jun 26, 2021  8:36 PM)      Discontinued 05/17/20 1103   FLUoxetine (PROZAC) 20 MG tablet [id number] Yes Take 1 tablet (20 mg total) by mouth daily. For anxiety  Doreene Nest, NP Taking Active   HYDROcodone-acetaminophen (NORCO/VICODIN) 5-325 MG tablet [id number] Yes Take 1 tablet by mouth every 6 (six) hours as needed for severe pain. Al Decant, PA-C Taking Active    Patient not taking:   Discontinued 09/19/19 1147 ondansetron (ZOFRAN ODT) 4 MG disintegrating tablet [id number] Yes Take 1-2 tablets (4-8 mg total) by mouth every 8 (eight) hours as needed for nausea or vomiting. Tereso Newcomer, MD Taking Active Self    Discontinued 05/17/20 1103    Patient not taking:   Discontinued 05/17/20 1103   propranolol ER (INDERAL LA) 120 MG 24 hr capsule [id number] Yes Take 1 capsule (120 mg total) by mouth at bedtime. For headache prevention Doreene Nest, NP Taking Active     Discontinued 05/17/20 1103   SUMAtriptan (IMITREX) 50 MG tablet [id number] Yes Take 1 tablet by mouth at migraine onset. May repeat in 2 hours if headache persists or recurs. Doreene Nest, NP Taking Active             Home Care and Equipment/Supplies: Were Home Health Services Ordered?: NA Any new equipment or medical supplies ordered?: NA  Functional Questionnaire: Do you need assistance with bathing/showering or dressing?: No Do you need assistance with meal preparation?: No Do you need assistance with eating?: No Do you have difficulty maintaining continence: No Do you need assistance with getting out of bed/getting out of a chair/moving?: No Do you have difficulty managing or taking your medications?: No  Follow up appointments reviewed: PCP Follow-up appointment confirmed?: No MD Provider Line Number:[phone] Given: Yes Specialist Hospital Follow-up  appointment confirmed?: Yes Date of Specialist follow-up appointment?: 05/17/23 Follow-Up Specialty Provider:: [facility] Do you need transportation to your follow-up appointment?: No Do you understand care options if your condition(s) worsen?: Yes-patient verbalized  understanding    SIGNATURE  Woodfin Ganja LPN [facility] Nurse Health Advisor Direct Dial [phone]

## 2023-06-28 ENCOUNTER — Telehealth (INDEPENDENT_AMBULATORY_CARE_PROVIDER_SITE_OTHER): Payer: Self-pay | Admitting: Primary Care

## 2023-06-28 VITALS — Ht 63.0 in | Wt 133.0 lb

## 2023-06-28 DIAGNOSIS — F411 Generalized anxiety disorder: Secondary | ICD-10-CM

## 2023-06-28 DIAGNOSIS — G43009 Migraine without aura, not intractable, without status migrainosus: Secondary | ICD-10-CM

## 2023-06-28 NOTE — Patient Instructions (Signed)
Increase your Prozac to 20 mg daily.  Send me your forms.  Please update me on MyChart in 3-4 weeks.  It was a pleasure to see you today!

## 2023-06-28 NOTE — Progress Notes (Signed)
Patient ID: Stephanie [name], female    DOB: 09/24/1996, 27 y.o.   MRN: [id number]  Virtual visit completed through [employer], a video enabled telemedicine application. Due to national recommendations of social distancing due to COVID-19, a virtual visit is felt to be most appropriate for this patient at this time. Reviewed limitations, risks, security and privacy concerns of performing a virtual visit and the availability of in person appointments. I also reviewed that there may be a patient responsible charge related to this service. The patient agreed to proceed.   Patient location: car Provider location: [place] at [facility], office Persons participating in this virtual visit: patient, provider   If any vitals were documented, they were collected by patient at home unless specified below.    Ht 5\' 3"  (1.6 m)   Wt 133 lb (60.3 kg)   BMI 23.56 kg/m    CC: Anxiety and headaches Subjective:   HPI: Stephanie [name] is a 27 y.o. female with a history of migraines, depression, GAD, headaches presenting on 06/28/2023 for Paperwork (Work is requiring her to drive long distances. She is worried her anxiety will prevent her from doing)  She was last evaluated by me on 05/02/2023 for follow-up of headaches and anxiety.  During this visit her headaches had improved with propranolol ER 80 mg nightly, but experienced migraines 2-3 times weekly.  Her anxiety continued without improvement on propranolol.  She had failed Zoloft and Lexapro previously.  Given her ongoing symptoms we increased her propranolol ER to 120 mg nightly and initiated fluoxetine 20 mg daily.  Since her last visit her headaches have improved overall. She began taking her Prozac about 1 month ago, she initially began to feel less "reactive" to others, but otherwise she's not noticed much improvement in overall anxiety. She is taking 10 mg of Prozac daily, has not increased to 20 mg.  Over the last 6 weeks she's been  under a lot stress in her work and personal life. She continues to feel "on edge", irritable, worrying, feeling nervous and anxious. She has chronic anxiety with driving in general, especially long distances and on the highway. She prefers to take the back roads. She lost a friend who died in a car accident 7 years ago, isn't sure if this is part of it.  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 The role requires her to travel to [place], [place], [place], [place], [place].   She is asking for an accomodation for work due to her anxiety. She can drive, but cannot drive longer than 30 minute or she will experience panic attacks. Symptoms include difficulty focusing, palpitations, crying, shaking. She lives in [place] near [place]. She is comfortable with driving in [place] and [place]. She has forms for [place] to complete for this request.      Relevant past medical, surgical, family and social history reviewed and updated as indicated. Interim medical history since our last visit reviewed. Allergies and medications reviewed and updated. Outpatient Medications Prior to Visit  Medication Sig Dispense Refill   acetaminophen (TYLENOL) 500 MG tablet Take 1,000 mg by mouth every 6 (six) hours as needed.     clindamycin (CLINDAGEL) 1 % gel Apply topically 2 (two) times daily. 30 g 0   etonogestrel (NEXPLANON) 68 MG IMPL implant 1 each by Subdermal route continuous.     FLUoxetine (PROZAC) 20 MG tablet Take 1 tablet (20 mg total)  by mouth daily. For anxiety 90 tablet 0   ondansetron (ZOFRAN ODT) 4 MG disintegrating tablet Take 1-2 tablets (4-8 mg total) by mouth every 8 (eight) hours as needed for nausea or vomiting. 30 tablet 0   propranolol ER (INDERAL LA) 120 MG 24 hr capsule Take 1 capsule (120 mg total) by mouth at bedtime. For headache prevention 90 capsule 0   SUMAtriptan (IMITREX) 50 MG tablet  Take 1 tablet by mouth at migraine onset. May repeat in 2 hours if headache persists or recurs. 10 tablet 0   doxycycline (VIBRA-TABS) 100 MG tablet Take 1 tablet (100 mg total) by mouth 2 (two) times daily. 14 tablet 1   HYDROcodone-acetaminophen (NORCO/VICODIN) 5-325 MG tablet Take 1 tablet by mouth every 6 (six) hours as needed for severe pain. 10 tablet 0   Facility-Administered Medications Prior to Visit  Medication Dose Route Frequency Provider Last Rate Last Admin   0.9 %  sodium chloride infusion  500 mL Intravenous Once Cirigliano, Vito V, DO         Per HPI unless specifically indicated in ROS section below Review of Systems  Constitutional:  Positive for diaphoresis.  Cardiovascular:  Positive for palpitations.  Neurological:  Positive for headaches.  Psychiatric/Behavioral:  The patient is nervous/anxious.    Objective:  Ht 5\' 3"  (1.6 m)   Wt 133 lb (60.3 kg)   BMI 23.56 kg/m   Wt Readings from Last 3 Encounters:  06/28/23 133 lb (60.3 kg)  05/15/23 125 lb (56.7 kg)  05/13/23 134 lb (60.8 kg)       Physical exam: General: Alert and oriented x 3, no distress, does not appear sickly  Pulmonary: Speaks in complete sentences without increased work of breathing, no cough during visit.  Psychiatric: Normal mood, thought content, and behavior.     Results for orders placed or performed during the hospital encounter of 05/15/23  CBC  Result Value Ref Range   WBC 5.1 4.0 - 10.5 K/uL   RBC 4.12 3.87 - 5.11 MIL/uL   Hemoglobin 13.0 12.0 - 15.0 g/dL   HCT 78.2 95.6 - 21.3 %   MCV 93.9 80.0 - 100.0 fL   MCH 31.6 26.0 - 34.0 pg   MCHC 33.6 30.0 - 36.0 g/dL   RDW 08.6 57.8 - 46.9 %   Platelets 243 150 - 400 K/uL   nRBC 0.0 0.0 - 0.2 %  Basic metabolic panel  Result Value Ref Range   Sodium 138 135 - 145 mmol/L   Potassium 4.4 3.5 - 5.1 mmol/L   Chloride 105 98 - 111 mmol/L   CO2 28 22 - 32 mmol/L   Glucose, Bld 89 70 - 99 mg/dL   BUN 10 6 - 20 mg/dL   Creatinine,  Ser 6.29 0.44 - 1.00 mg/dL   Calcium 9.4 8.9 - 52.8 mg/dL   GFR, Estimated >41 >32 mL/min   Anion gap 5 5 - 15   Assessment & Plan:   Problem List Items Addressed This Visit       Cardiovascular and Mediastinum   Migraines - Primary (Chronic)    Improving.  Continue propranolol ER 120 mg daily. Continue sumatriptan 50 mg PRN.        Other   Generalized anxiety disorder (Chronic)    Slight improvement with Prozac at 10 mg. Discussed to increase up to 20 mg daily.  Continue to closely monitor.  Agree to complete form for work accomodation. She will attach to MyChart.  No orders of the defined types were placed in this encounter.  No orders of the defined types were placed in this encounter.   I discussed the assessment and treatment plan with the patient. The patient was provided an opportunity to ask questions and all were answered. The patient agreed with the plan and demonstrated an understanding of the instructions. The patient was advised to call back or seek an in-person evaluation if the symptoms worsen or if the condition fails to improve as anticipated.  Follow up plan:  Increase your Prozac to 20 mg daily.  Send me your forms.  Please update me on MyChart in 3-4 weeks.  It was a pleasure to see you today!   Doreene Nest, NP

## 2023-06-28 NOTE — Assessment & Plan Note (Signed)
Improving.  Continue propranolol ER 120 mg daily. Continue sumatriptan 50 mg PRN.

## 2023-06-28 NOTE — Assessment & Plan Note (Signed)
Slight improvement with Prozac at 10 mg. Discussed to increase up to 20 mg daily.  Continue to closely monitor.  Agree to complete form for work accomodation. She will attach to MyChart.

## 2023-06-28 NOTE — Telephone Encounter (Signed)
Form completed and placed in Kelli's inbox for faxing.

## 2023-07-02 ENCOUNTER — Telehealth: Payer: Self-pay

## 2023-07-02 NOTE — Telephone Encounter (Signed)
Faxed paperwork to [employer]. Successful confirmation sheet printed

## 2023-07-29 ENCOUNTER — Other Ambulatory Visit: Payer: Self-pay

## 2023-07-29 ENCOUNTER — Encounter (HOSPITAL_COMMUNITY): Payer: Self-pay

## 2023-07-29 ENCOUNTER — Emergency Department (HOSPITAL_COMMUNITY): Payer: Self-pay

## 2023-07-29 ENCOUNTER — Emergency Department (HOSPITAL_COMMUNITY)
Admission: EM | Admit: 2023-07-29 | Discharge: 2023-07-29 | Disposition: A | Discharge status: Home / Self Care | Payer: Self-pay | Attending: Emergency Medicine | Admitting: Emergency Medicine

## 2023-07-29 DIAGNOSIS — R55 Syncope and collapse: Secondary | ICD-10-CM | POA: Insufficient documentation

## 2023-07-29 DIAGNOSIS — R112 Nausea with vomiting, unspecified: Secondary | ICD-10-CM | POA: Insufficient documentation

## 2023-07-29 DIAGNOSIS — R109 Unspecified abdominal pain: Secondary | ICD-10-CM

## 2023-07-29 DIAGNOSIS — R1030 Lower abdominal pain, unspecified: Secondary | ICD-10-CM | POA: Insufficient documentation

## 2023-07-29 LAB — URINALYSIS, ROUTINE W REFLEX MICROSCOPIC
Bacteria, UA: NONE SEEN
Bilirubin Urine: NEGATIVE
Glucose, UA: NEGATIVE mg/dL
Ketones, ur: 20 mg/dL — AB
Nitrite: NEGATIVE
Protein, ur: NEGATIVE mg/dL
RBC / HPF: >50 RBC/hpf (ref 0–5)
Specific Gravity, Urine: 1.033 — ABNORMAL HIGH (ref 1.005–1.030)
pH: 6 (ref 5.0–8.0)

## 2023-07-29 LAB — BASIC METABOLIC PANEL
Anion gap: 8 (ref 5–15)
BUN: 9 mg/dL (ref 6–20)
CO2: 25 mmol/L (ref 22–32)
Calcium: 8.9 mg/dL (ref 8.9–10.3)
Chloride: 105 mmol/L (ref 98–111)
Creatinine, Ser: 0.86 mg/dL (ref 0.44–1.00)
GFR, Estimated: >60 mL/min (ref >60)
Glucose, Bld: 93 mg/dL (ref 70–99)
Potassium: 3.6 mmol/L (ref 3.5–5.1)
Sodium: 138 mmol/L (ref 135–145)

## 2023-07-29 LAB — CBC
HCT: 41 % (ref 36.0–46.0)
Hemoglobin: 13.3 g/dL (ref 12.0–15.0)
MCH: 30 pg (ref 26.0–34.0)
MCHC: 32.4 g/dL (ref 30.0–36.0)
MCV: 92.6 fL (ref 80.0–100.0)
Platelets: 248 10*3/uL (ref 150–400)
RBC: 4.43 MIL/uL (ref 3.87–5.11)
RDW: 12.1 % (ref 11.5–15.5)
WBC: 4.4 10*3/uL (ref 4.0–10.5)
nRBC: 0 % (ref 0.0–0.2)

## 2023-07-29 LAB — HCG, SERUM, QUALITATIVE: Preg, Serum: NEGATIVE

## 2023-07-29 LAB — CBG MONITORING, ED: Glucose-Capillary: 89 mg/dL (ref 70–99)

## 2023-07-29 MED ORDER — ONDANSETRON HCL 4 MG/2ML IJ SOLN
4.0000 mg | Freq: Once | INTRAMUSCULAR | Status: AC
  Administered 2023-07-29: 4 mg via INTRAVENOUS
  Filled 2023-07-29: qty 2

## 2023-07-29 MED ORDER — ONDANSETRON HCL 4 MG PO TABS: 4.0000 mg | ORAL_TABLET | Freq: Four times a day (QID) | ORAL | 0 refills | Status: DC

## 2023-07-29 MED ORDER — SODIUM CHLORIDE 0.9 % IV BOLUS
1000.0000 mL | Freq: Once | INTRAVENOUS | Status: AC
  Administered 2023-07-29: 1000 mL via INTRAVENOUS

## 2023-07-29 MED ORDER — IOHEXOL 300 MG/ML  SOLN
100.0000 mL | Freq: Once | INTRAMUSCULAR | Status: AC | PRN
  Administered 2023-07-29: 100 mL via INTRAVENOUS

## 2023-07-29 NOTE — ED Triage Notes (Signed)
Patient said she has vomited every hour since 4am. Feels sweaty after vomiting. Passed out on the floor after vomiting 30 minutes ago.

## 2023-07-29 NOTE — ED Provider Notes (Cosign Needed Addendum)
[place] EMERGENCY DEPARTMENT AT [facility] Provider Note   CSN: [id number] Arrival date & time: 07/29/23  1243     History  Chief Complaint  Patient presents with   Emesis   HPI Stephanie [name] is a 27 y.o. female with endometriosis, IBS presenting for vomiting.  States she has been vomiting about every hour since 4 AM this morning.  Also endorses nausea.  No blood in the vomit.  States she was starting last night.  Denies marijuana use.  States she had a normal bowel movement about 2 days ago.  States about an hour ago she passed out on the floor while vomiting.  Unsure if she hit her head.  States per boyfriend may be lost consciousness for 2 to 3 seconds.  Denies headache but states she does have some visual blurriness that has been intermittent since she passed out.  Denies fever.  States she has started her cycle and has had recurrent vomiting with her menstrual cycle in the past.  States her bleeding has been slightly worse than usual.  She is changing her pads every 2 hours.  Endorses abdominal pain in the lower abdomen.   Emesis      Home Medications Prior to Admission medications   Medication Sig Start Date End Date Taking? Authorizing Provider  acetaminophen (TYLENOL) 500 MG tablet Take 1,000 mg by mouth every 6 (six) hours as needed.    [provider]  clindamycin (CLINDAGEL) 1 % gel Apply topically 2 (two) times daily. 05/15/23   Al Decant, PA-C  etonogestrel (NEXPLANON) 68 MG IMPL implant 1 each by Subdermal route continuous. 09/30/18   [provider]  FLUoxetine (PROZAC) 20 MG tablet Take 1 tablet (20 mg total) by mouth daily. For anxiety 05/02/23   Doreene Nest, NP  ondansetron (ZOFRAN ODT) 4 MG disintegrating tablet Take 1-2 tablets (4-8 mg total) by mouth every 8 (eight) hours as needed for nausea or vomiting. 05/25/21   Anyanwu, Jethro Bastos, MD  propranolol ER (INDERAL LA) 120 MG 24 hr capsule Take 1 capsule (120 mg  total) by mouth at bedtime. For headache prevention 05/02/23   Doreene Nest, NP  SUMAtriptan (IMITREX) 50 MG tablet Take 1 tablet by mouth at migraine onset. May repeat in 2 hours if headache persists or recurs. 03/29/23   Doreene Nest, NP  dicyclomine (BENTYL) 20 MG tablet Take 1 tablet (20 mg total) by mouth 2 (two) times daily. 10/26/19 05/17/20  Joy, Shawn C, PA-C  famotidine (PEPCID) 20 MG tablet Take 1 tablet (20 mg total) by mouth 2 (two) times daily. 02/07/20 05/17/20  Henderly, Britni A, PA-C  metoCLOPramide (REGLAN) 10 MG tablet Take 1 tablet (10 mg total) by mouth every 6 (six) hours as needed for nausea or vomiting. Patient not taking: Reported on 09/19/2019 06/19/19 09/19/19  Caccavale, Sophia, PA-C  pantoprazole (PROTONIX) 40 MG tablet Take 40 mg twice a day for 8 weeks 03/04/20 05/17/20  Cirigliano, Verlin Dike, DO  promethazine (PHENERGAN) 25 MG suppository Place 1 suppository (25 mg total) rectally every 6 (six) hours as needed for nausea or vomiting. Patient not taking: Reported on 03/04/2020 02/07/20 05/17/20  Henderly, Britni A, PA-C  sucralfate (CARAFATE) 1 GM/10ML suspension Take 10 mLs (1 g total) by mouth 4 (four) times daily -  with meals and at bedtime. 02/07/20 05/17/20  Henderly, Britni A, PA-C      Allergies    Almond (diagnostic), Coconut flavor, and Septra [sulfamethoxazole-trimethoprim]  Review of Systems   Review of Systems  Gastrointestinal:  Positive for vomiting.    Physical Exam   Vitals:   07/29/23 1926 07/29/23 1930  BP:  112/85  Pulse:  98  Resp:  16  Temp: 98.5 F (36.9 C) 98.5 F (36.9 C)  SpO2:  100%    CONSTITUTIONAL:  well-appearing, NAD NEURO:  GCS 15. Speech is goal oriented. No deficits appreciated to CN III-XII; symmetric eyebrow raise, no facial drooping, tongue midline. Patient has equal grip strength bilaterally with 5/5 strength against resistance in all major muscle groups bilaterally. Sensation to light touch intact. Patient moves  extremities without ataxia. Normal finger-nose-finger. Patient ambulatory with steady gait. EYES:  eyes equal and reactive ENT/NECK:  Supple, no stridor  CARDIO:  regular rate and rhythm, appears well-perfused  PULM:  No respiratory distress, CTAB GI/GU:  non-distended, soft, lower abdominal tenderness MSK/SPINE:  No gross deformities, no edema, moves all extremities  SKIN:  no rash, atraumatic  *Additional and/or pertinent findings included in MDM below  ED Results / Procedures / Treatments   Labs (all labs ordered are listed, but only abnormal results are displayed) Labs Reviewed  URINALYSIS, ROUTINE W REFLEX MICROSCOPIC - Abnormal; Notable for the following components:      Result Value   APPearance HAZY (*)    Specific Gravity, Urine 1.033 (*)    Hgb urine dipstick LARGE (*)    Ketones, ur 20 (*)    Leukocytes,Ua TRACE (*)    All other components within normal limits  BASIC METABOLIC PANEL  CBC  HCG, SERUM, QUALITATIVE  CBG MONITORING, ED    EKG EKG Interpretation Date/Time:  Monday July 29 2023 12:54:03 EDT Ventricular Rate:  97 PR Interval:  138 QRS Duration:  91 QT Interval:  326 QTC Calculation: 415 R Axis:   84  Text Interpretation: Sinus rhythm no acute ST/T changes similar to May 2024 Confirmed by Pricilla Loveless [phone]) on 07/29/2023 4:59:35 PM  Radiology CT ABDOMEN PELVIS W CONTRAST  Result Date: 07/29/2023 CLINICAL DATA:  Persistent vomiting EXAM: CT ABDOMEN AND PELVIS WITH CONTRAST TECHNIQUE: Multidetector CT imaging of the abdomen and pelvis was performed using the standard protocol following bolus administration of intravenous contrast. RADIATION DOSE REDUCTION: This exam was performed according to the departmental dose-optimization program which includes automated exposure control, adjustment of the mA and/or kV according to patient size and/or use of iterative reconstruction technique. CONTRAST:  OMNIPAQUE IOHEXOL 300 MG/ML  SOLN COMPARISON:  CT  abdomen and pelvis dated 02/07/2020 FINDINGS: Lower chest: No focal consolidation or pulmonary nodule in the lung bases. No pleural effusion or pneumothorax demonstrated. Partially imaged heart size is normal. Hepatobiliary: No focal hepatic lesions. No intra or extrahepatic biliary ductal dilation. Normal gallbladder. Pancreas: No focal lesions or main ductal dilation. Spleen: Normal in size without focal abnormality. Adrenals/Urinary Tract: No adrenal nodules. No suspicious renal mass, calculi or hydronephrosis. No focal bladder wall thickening. Stomach/Bowel: Underdistended stomach with mild mural thickening of the gastric antrum. No evidence of bowel wall thickening, distention, or inflammatory changes. Normal appendix. Vascular/Lymphatic: No significant vascular findings are present. No enlarged abdominal or pelvic lymph nodes. Reproductive: No adnexal masses.  Tampon within the vagina. Other: No free fluid, fluid collection, or free air. Musculoskeletal: No acute or abnormal lytic or blastic osseous lesions. IMPRESSION: 1. Underdistended stomach with mild mural thickening of the gastric antrum, which may be due to underdistention or gastritis. 2. Otherwise no acute abdominopelvic findings. Electronically Signed   By:  Agustin Cree M.D.   On: 07/29/2023 19:50   CT Head Wo Contrast  Result Date: 07/29/2023 CLINICAL DATA:  Syncope EXAM: CT HEAD WITHOUT CONTRAST TECHNIQUE: Contiguous axial images were obtained from the base of the skull through the vertex without intravenous contrast. RADIATION DOSE REDUCTION: This exam was performed according to the departmental dose-optimization program which includes automated exposure control, adjustment of the mA and/or kV according to patient size and/or use of iterative reconstruction technique. COMPARISON:  None Available. FINDINGS: Brain: There is no mass, hemorrhage or extra-axial collection. The size and configuration of the ventricles and extra-axial CSF spaces are  normal. The brain parenchyma is normal, without acute or chronic infarction. Vascular: No abnormal hyperdensity of the major intracranial arteries or dural venous sinuses. No intracranial atherosclerosis. Skull: The visualized skull base, calvarium and extracranial soft tissues are normal. Sinuses/Orbits: No fluid levels or advanced mucosal thickening of the visualized paranasal sinuses. No mastoid or middle ear effusion. The orbits are normal. IMPRESSION: Normal head CT. Electronically Signed   By: Deatra Niklaus Mamaril M.D.   On: 07/29/2023 19:20    Procedures Procedures    Medications Ordered in ED Medications  sodium chloride 0.9 % bolus 1,000 mL (1,000 mLs Intravenous New Bag/Given 07/29/23 1626)  ondansetron (ZOFRAN) injection 4 mg (4 mg Intravenous Given 07/29/23 1627)  iohexol (OMNIPAQUE) 300 MG/ML solution 100 mL (100 mLs Intravenous Contrast Given 07/29/23 1702)    ED Course/ Medical Decision Making/ A&P                                 Medical Decision Making Amount and/or Complexity of Data Reviewed Labs: ordered. Radiology: ordered.  Risk Prescription drug management.   Initial Impression and Ddx 27 year old well-appearing female presenting for abdominal pain and syncope.  Exam notable for lower abdominal tenderness but otherwise reassuring.  DDx includes appendicitis, diverticulitis, ovarian torsion, PID, ectopic pregnancy, AUB.  Patient PMH that increases complexity of ED encounter: endometriosis, IBS   Interpretation of Diagnostics I independent reviewed and interpreted the labs as followed: hematuria  - I independently visualized the following imaging with scope of interpretation limited to determining acute life threatening conditions related to emergency care: CT ab/pelvis, which revealed underdistended stomach with mild mural thickening.  CT head with no acute findings.  -I personally reviewed and interpreted EKG which revealed sinus rhythm  Patient Reassessment and Ultimate  Disposition/Management On reassessment patient stated she felt much better.  Given CT findings suspect a viral gastritis.  Syncope could be related to transient hypovolemia related to persistent vomiting.  Overall she looks clinically well, no acute distress and hemodynamically stable.  Workup was unremarkable.  Doubt ovarian torsion given abdominal pain was more generalized and no suggestions on CT scan.  Advised to follow with her PCP.  Also advised to follow-up with OB/GYN for her vaginal bleeding and to return if she begins to soak a pad per hour. Discussed pertinent return precautions.  Vital stable.  Discharged home in condition.  Patient management required discussion with the following services or consulting groups:  None  Complexity of Problems Addressed Acute complicated illness or Injury  Additional Data Reviewed and Analyzed Further history obtained from: Past medical history and medications listed in the EMR, Prior ED visit notes, and Recent discharge summary  Patient Encounter Risk Assessment Prescriptions      Final Clinical Impression(s) / ED Diagnoses Final diagnoses:  Abdominal pain, unspecified abdominal location  Syncope, unspecified syncope type    Rx / DC Orders ED Discharge Orders     None         Gareth Eagle, PA-C 07/29/23 2006    Gareth Eagle, PA-C 07/29/23 2017    Pricilla Loveless, MD 07/30/23 [phone]

## 2023-07-29 NOTE — Discharge Instructions (Addendum)
Evaluation today was overall reassuring.  Suspect he may have had a viral gastritis given CT findings.  Recommend assertive hydration at home.  You can also take Zofran for nausea and vomiting.  Please follow-up with your PCP.  If you have worsening abdominal pain, develop a fever, worsening vaginal bleeding or any other concerning symptom please return emerged part further evaluation.  Sent Zofran to your pharmacy.

## 2023-09-11 ENCOUNTER — Ambulatory Visit (INDEPENDENT_AMBULATORY_CARE_PROVIDER_SITE_OTHER): Payer: Self-pay | Admitting: Primary Care

## 2023-09-11 ENCOUNTER — Encounter: Payer: Self-pay | Admitting: Primary Care

## 2023-09-11 ENCOUNTER — Other Ambulatory Visit (HOSPITAL_COMMUNITY): Payer: Self-pay

## 2023-09-11 VITALS — BP 122/84 | HR 84 | Temp 97.3°F | Ht 63.0 in | Wt 135.0 lb

## 2023-09-11 DIAGNOSIS — L509 Urticaria, unspecified: Secondary | ICD-10-CM

## 2023-09-11 DIAGNOSIS — M546 Pain in thoracic spine: Secondary | ICD-10-CM

## 2023-09-11 LAB — CBC WITH DIFFERENTIAL/PLATELET
Basophils Absolute: 0 10*3/uL (ref 0.0–0.1)
Basophils Relative: 0.5 % (ref 0.0–3.0)
Eosinophils Absolute: 0 10*3/uL (ref 0.0–0.7)
Eosinophils Relative: 0.3 % (ref 0.0–5.0)
HCT: 41.6 % (ref 36.0–46.0)
Hemoglobin: 13.5 g/dL (ref 12.0–15.0)
Lymphocytes Relative: 29.6 % (ref 12.0–46.0)
Lymphs Abs: 1.5 10*3/uL (ref 0.7–4.0)
MCHC: 32.5 g/dL (ref 30.0–36.0)
MCV: 93.1 fL (ref 78.0–100.0)
Monocytes Absolute: 0.2 10*3/uL (ref 0.1–1.0)
Monocytes Relative: 4.7 % (ref 3.0–12.0)
Neutro Abs: 3.2 10*3/uL (ref 1.4–7.7)
Neutrophils Relative %: 64.9 % (ref 43.0–77.0)
Platelets: 257 10*3/uL (ref 150.0–400.0)
RBC: 4.47 Mil/uL (ref 3.87–5.11)
RDW: 13.9 % (ref 11.5–15.5)
WBC: 5 10*3/uL (ref 4.0–10.5)

## 2023-09-11 MED ORDER — EPINEPHRINE 0.3 MG/0.3ML IJ SOAJ: 0.3000 mg | INTRAMUSCULAR | 0 refills | Status: DC | PRN

## 2023-09-11 MED ORDER — CYCLOBENZAPRINE HCL 5 MG PO TABS
5.0000 mg | ORAL_TABLET | Freq: Three times a day (TID) | ORAL | 0 refills | Status: DC | PRN
Start: 2023-09-11 — End: 2023-12-25

## 2023-09-11 NOTE — Progress Notes (Signed)
Subjective:    Patient ID: Stephanie [name], female    DOB: 10/26/1996, 27 y.o.   MRN: [id number]  Back Pain Pertinent negatives include no abdominal pain or numbness.    BRAXLEY [name] is a very pleasant 27 y.o. female with a history of migraines, near syncope, flank pain, dysmenorrhea, menorrhagia with regular cycle, nausea and vomiting, who presents today to discuss back pain and hives.  1) Acute Back Pain: Her pain is located to the mid thoracic back which began about 2 mornings ago when leaning up after picking up a dog toy. Last night she noticed her pain move downward to the mid/right thoracic spine. Her pain is worse with turning her torso right or left, or when leaning up from bending forward or squatting. Yesterday she noticed a few seconds of numbness to the left upper extremity.  She denies injury, trauma, overworking her muscle, lower extremity weakness, numbness.tinging to her lower extremities, loss of bowel/bladder control, dysuria, urinary frequency, groin pain, hematuria. She took one pill of her mother's muscle relaxer 2 nights ago without improvement.   2) Hives: Originally began 2 years ago, occurred 2-3 times weekly, resolved spontaneously when she moved apartments. Over the last 6 months she's noticed intermittent itching with hives to her chest, upper extremities, and lower extremities. Hives occurs to all locations at once. Last night she noticed the same type of hives around her mouth and to her chest with a scratchy throat.  Last night she ate popcorn with coconut for which she suspects she's allergic.  She's been taking Benadryl and sleeping with temporary resolve. Her sister was recently diagnosed with multiple allergies, mostly to cats.   She denies wheezing, shortness of breath, abdominal bloating, diarrhea, nausea, vomiting. She denies new medications, new foods, known tick bites. She does have a dog.  Review of Systems  Respiratory:  Negative for  shortness of breath and wheezing.   Gastrointestinal:  Negative for abdominal pain, diarrhea and vomiting.  Musculoskeletal:  Positive for back pain and myalgias.  Skin:  Positive for rash.  Neurological:  Negative for numbness.         Past Medical History:  Diagnosis Date   Acute pancreatitis without infection or necrosis 04/21/2019   Anemia    Anxiety 01/28/2013   no meds   Depression    no meds   Hidradenitis    IBS (irritable bowel syndrome)    Migraines    HA every other day - otc med prn   Recurrent streptococcal tonsillitis    Seasonal allergies    Seasonal environmental allergies   Sebaceous cyst 08/04/2018   L mid back    Vision abnormalities    Myopia; patient has history of devloping ulcers when she wore contacts.    Social History   Socioeconomic History   Marital status: Single    Spouse name: Not on file   Number of children: 0   Years of education: Not on file   Highest education level: Not on file  Occupational History   Occupation: Nanny  Tobacco Use   Smoking status: Never   Smokeless tobacco: Never  Vaping Use   Vaping status: Never Used  Substance and Sexual Activity   Alcohol use: Yes    Comment: socially   Drug use: Yes    Types: Marijuana    Comment: Last use Thursday 10/16/18   Sexual activity: Not Currently    Partners: Male    Birth control/protection: Implant  Comment: broke up with first sexual partner 1 month ago  Other Topics Concern   Not on file  Social History Narrative   Single.    Student at [employer].   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    Enjoys listening to music, hanging out with friends.    Social Determinants of Health   Financial Resource Strain: Not on file  Food Insecurity: Not on file  Transportation Needs: Not on file  Physical Activity: Not on file  Stress: Not on file  Social Connections: Not on file  Intimate Partner Violence: Not on file    Past Surgical History:  Procedure  Laterality Date   COLONOSCOPY     IBS   COLONOSCOPY     IBS   IRRIGATION AND DEBRIDEMENT ABSCESS Right 12/07/2021   Procedure: IRRIGATION AND DEBRIDEMENT AXILLARY ABSCESS;  Surgeon: Axel Filler, MD;  Location: [facility] OR;  Service: General;  Laterality: Right;   LAPAROSCOPY N/A 10/20/2018   Procedure: LAPAROSCOPY DIAGNOSTIC possible removal of endometriosis;  Surgeon: Mitchel Honour, DO;  Location: [facility] ORS;  Service: Gynecology;  Laterality: N/A;   UPPER GASTROINTESTINAL ENDOSCOPY  03/04/2020    Family History  Problem Relation Age of Onset   Depression Mother    Migraines Mother    Irritable bowel syndrome Mother    Hypertension Mother    Migraines Father    Diabetes Father    Diabetes Maternal Grandmother    Hypertension Maternal Grandmother    Diabetes Sister    Stomach cancer Sister 13   Colon cancer Neg Hx    Esophageal cancer Neg Hx    Rectal cancer Neg Hx     Allergies  Allergen Reactions   Almond (Diagnostic) Other (See Comments)    [place]   Coconut Flavor Other (See Comments)    Mouth itches, but no impaired breathing (per patient   Septra [Sulfamethoxazole-Trimethoprim] Nausea Only and Other (See Comments)    Patient took a few courses of this and it ended up being stopped by a MD because it had started causing "bad stomach pain" (per the patient)    Current Outpatient Medications on File Prior to Visit  Medication Sig Dispense Refill   acetaminophen (TYLENOL) 500 MG tablet Take 1,000 mg by mouth every 6 (six) hours as needed.     clindamycin (CLINDAGEL) 1 % gel Apply topically 2 (two) times daily. 30 g 0   etonogestrel (NEXPLANON) 68 MG IMPL implant 1 each by Subdermal route continuous.     FLUoxetine (PROZAC) 20 MG tablet Take 1 tablet (20 mg total) by mouth daily. For anxiety 90 tablet 0   ondansetron (ZOFRAN ODT) 4 MG disintegrating tablet Take 1-2 tablets (4-8 mg total) by mouth every 8 (eight) hours as needed for nausea or vomiting. 30 tablet 0    ondansetron (ZOFRAN) 4 MG tablet Take 1 tablet (4 mg total) by mouth every 6 (six) hours. 12 tablet 0   propranolol ER (INDERAL LA) 120 MG 24 hr capsule Take 1 capsule (120 mg total) by mouth at bedtime. For headache prevention (Patient not taking: Reported on 09/11/2023) 90 capsule 0   SUMAtriptan (IMITREX) 50 MG tablet Take 1 tablet by mouth at migraine onset. May repeat in 2 hours if headache persists or recurs. (Patient not taking: Reported on 09/11/2023) 10 tablet 0   [DISCONTINUED] dicyclomine (BENTYL) 20 MG tablet Take 1 tablet (20 mg total) by mouth 2 (two) times daily. 20 tablet 0   [DISCONTINUED] famotidine (PEPCID) 20 MG tablet  Take 1 tablet (20 mg total) by mouth 2 (two) times daily. 30 tablet 0   [DISCONTINUED] metoCLOPramide (REGLAN) 10 MG tablet Take 1 tablet (10 mg total) by mouth every 6 (six) hours as needed for nausea or vomiting. (Patient not taking: Reported on 09/19/2019) 30 tablet 0   [DISCONTINUED] pantoprazole (PROTONIX) 40 MG tablet Take 40 mg twice a day for 8 weeks 120 tablet 1   [DISCONTINUED] promethazine (PHENERGAN) 25 MG suppository Place 1 suppository (25 mg total) rectally every 6 (six) hours as needed for nausea or vomiting. (Patient not taking: Reported on 03/04/2020) 12 each 0   [DISCONTINUED] sucralfate (CARAFATE) 1 GM/10ML suspension Take 10 mLs (1 g total) by mouth 4 (four) times daily -  with meals and at bedtime. 420 mL 0   No current facility-administered medications on file prior to visit.    BP 122/84   Pulse 84   Temp (!) 97.3 F (36.3 C) (Temporal)   Ht 5\' 3"  (1.6 m)   Wt 135 lb (61.2 kg)   SpO2 98%   BMI 23.91 kg/m  Objective:   Physical Exam Cardiovascular:     Rate and Rhythm: Normal rate and regular rhythm.  Pulmonary:     Effort: Pulmonary effort is normal.     Breath sounds: Normal breath sounds.  Musculoskeletal:     Cervical back: Neck supple.     Thoracic back: Spasms and tenderness present. No bony tenderness. Decreased range of  motion.       Back:     Comments: Decrease in ROM with pain with flexion, and thoracic back twisting right and left.  Skin:    General: Skin is warm and dry.     Findings: No erythema or rash.  Neurological:     Mental Status: She is alert and oriented to person, place, and time.  Psychiatric:        Mood and Affect: Mood normal.           Assessment & Plan:  Acute right-sided thoracic back pain Assessment & Plan: Suspect musculoskeletal based on HPI and exam today. Low suspicion for renal stones, pyelonephritis, UTI.  Treat with stretching, heating pads, ibuprofen 400 mg 3 times daily as needed. Start cyclobenzaprine 5 mg 3 times daily as needed.  Drowsiness precautions provided.  No alarm signs on exam.  Orders: -     Cyclobenzaprine HCl; Take 1 tablet (5 mg total) by mouth 3 (three) times daily as needed for muscle spasms.  Dispense: 15 tablet; Refill: 0  Full body hives Assessment & Plan: Exam today unremarkable. Symptoms are quite concerning.  Labs pending today for food allergy panel and alpha gal panel. Symptoms do not represent celiac.  Prescription for EpiPen ordered to the pharmacy, discussed instructions and indications for use.  Referral placed to allergist for skin prick testing and further evaluation.  Discussed to start daily antihistamine such as Zyrtec/Claritin/Allegra. Can add Pepcid if needed.  Orders: -     Food Allergy Profile -     Alpha-Gal Panel -     EPINEPHrine; Inject 0.3 mg into the muscle as needed for anaphylaxis.  Dispense: 1 each; Refill: 0 -     CBC with Differential/Platelet -     Ambulatory referral to Allergy        Doreene Nest, NP

## 2023-09-11 NOTE — Assessment & Plan Note (Signed)
Exam today unremarkable. Symptoms are quite concerning.  Labs pending today for food allergy panel and alpha gal panel. Symptoms do not represent celiac.  Prescription for EpiPen ordered to the pharmacy, discussed instructions and indications for use.  Referral placed to allergist for skin prick testing and further evaluation.  Discussed to start daily antihistamine such as Zyrtec/Claritin/Allegra. Can add Pepcid if needed.

## 2023-09-11 NOTE — Patient Instructions (Signed)
You may take the cyclobenzaprine muscle relaxer every 8 hours as needed.  This may cause drowsiness.  Start taking ibuprofen 400 mg every 8 hours as needed.  Try to stretch her back as discussed.  Continue ice/heat.  Stop by the lab prior to leaving today. I will notify you of your results once received.   You will either be contacted via phone regarding your referral to the allergist, or you may receive a letter on your MyChart portal from our referral team with instructions for scheduling an appointment. Please let us know if you have not been contacted by anyone within two weeks.  Only use the EpiPen for trouble breathing, throat tightness, etc. as discussed.  It was a pleasure to see you today!

## 2023-09-11 NOTE — Assessment & Plan Note (Signed)
Suspect musculoskeletal based on HPI and exam today. Low suspicion for renal stones, pyelonephritis, UTI.  Treat with stretching, heating pads, ibuprofen 400 mg 3 times daily as needed. Start cyclobenzaprine 5 mg 3 times daily as needed.  Drowsiness precautions provided.  No alarm signs on exam.

## 2023-09-20 LAB — INTERPRETATION:

## 2023-09-20 LAB — FOOD ALLERGY PROFILE
Allergen, Salmon, f41: <0.1 kU/L
Almonds: 0.56 kU/L — ABNORMAL HIGH
CLASS: 0
CLASS: 0
CLASS: 0
CLASS: 0
CLASS: 0
CLASS: 0
CLASS: 0
CLASS: 0
CLASS: 1
CLASS: 3
Cashew IgE: <0.1 kU/L
Class: 0
Class: 0
Class: 0
Class: 0
Egg White IgE: <0.1 kU/L
Fish Cod: <0.1 kU/L
Hazelnut: 7.01 kU/L — ABNORMAL HIGH
Milk IgE: <0.1 kU/L
Peanut IgE: <0.1 kU/L
Scallop IgE: <0.1 kU/L
Sesame Seed f10: <0.1 kU/L
Shrimp IgE: <0.1 kU/L
Soybean IgE: <0.1 kU/L
Tuna IgE: <0.1 kU/L
Walnut: <0.1 kU/L
Wheat IgE: 0.33 kU/L — ABNORMAL HIGH

## 2023-09-20 LAB — ALPHA-GAL PANEL
Allergen, Mutton, f88: <0.1 kU/L
Allergen, Pork, f26: <0.1 kU/L
Beef: <0.1 kU/L
CLASS: 0
CLASS: 0
Class: 0
GALACTOSE-ALPHA-1,3-GALACTOSE IGE*: <0.1 kU/L (ref <0.10)

## 2023-09-23 DIAGNOSIS — R112 Nausea with vomiting, unspecified: Secondary | ICD-10-CM | POA: Diagnosis not present

## 2023-09-23 DIAGNOSIS — R079 Chest pain, unspecified: Secondary | ICD-10-CM | POA: Diagnosis not present

## 2023-09-23 DIAGNOSIS — N946 Dysmenorrhea, unspecified: Secondary | ICD-10-CM | POA: Diagnosis not present

## 2023-09-23 DIAGNOSIS — N926 Irregular menstruation, unspecified: Secondary | ICD-10-CM | POA: Diagnosis not present

## 2023-09-23 DIAGNOSIS — R1013 Epigastric pain: Secondary | ICD-10-CM | POA: Diagnosis not present

## 2023-10-01 ENCOUNTER — Telehealth: Payer: Self-pay | Admitting: Primary Care

## 2023-10-01 DIAGNOSIS — L509 Urticaria, unspecified: Secondary | ICD-10-CM

## 2023-10-01 DIAGNOSIS — Z91018 Allergy to other foods: Secondary | ICD-10-CM

## 2023-10-01 NOTE — Telephone Encounter (Signed)
Patient called in asking about getting tested for Ciliac disease ,she was advised to either get it done here or at [facility],and she would like to come here.I don't see that the orders have been placed.

## 2023-10-02 NOTE — Telephone Encounter (Signed)
See result note dated 09/20/2023 which recommends celiac testing given her wheat allergy.  Was awaiting patient's approval.  Orders placed.  Lab only appointment is fine. Thanks!

## 2023-10-02 NOTE — Telephone Encounter (Signed)
Patient scheduled.

## 2023-10-10 ENCOUNTER — Other Ambulatory Visit (INDEPENDENT_AMBULATORY_CARE_PROVIDER_SITE_OTHER): Payer: Self-pay

## 2023-10-10 DIAGNOSIS — Z91018 Allergy to other foods: Secondary | ICD-10-CM | POA: Diagnosis not present

## 2023-10-10 DIAGNOSIS — L509 Urticaria, unspecified: Secondary | ICD-10-CM | POA: Diagnosis not present

## 2023-10-16 NOTE — Telephone Encounter (Signed)
Called patient advised that we have not received results yet. We will reach out once received and reviewed by provider.

## 2023-10-16 NOTE — Telephone Encounter (Signed)
Pt called to see if her results were back yet? Call back # [phone]

## 2023-10-23 LAB — CELIAC PNL 2 RFLX ENDOMYSIAL AB TTR
(tTG) Ab, IgA: <1 U/mL
(tTG) Ab, IgG: <1 U/mL
Endomysial Ab IgA: NEGATIVE
Gliadin IgA: 14.8 U/mL
Gliadin IgG: 23.5 U/mL — ABNORMAL HIGH
Immunoglobulin A: 125 mg/dL (ref 47–310)

## 2023-11-05 ENCOUNTER — Ambulatory Visit: Payer: Self-pay | Admitting: Internal Medicine

## 2023-12-24 ENCOUNTER — Ambulatory Visit: Payer: Self-pay | Admitting: Primary Care

## 2023-12-24 NOTE — Telephone Encounter (Signed)
 Noted

## 2023-12-24 NOTE — Telephone Encounter (Addendum)
 Chief Complaint: Back pain Symptoms: Back pain, cough, sore throat, nasal congestion. Frequency: Intermittent Pertinent Negatives: Patient denies Fever, numbness/weakness, tingling, swelling Disposition: [] ED /[] Urgent Care (no appt availability in office) / [x] Appointment(In office/virtual)/ []  [facility]/ [] Home Care/ [] Refused Recommended Disposition /[] [facility]/ []  Follow-up with PCP Additional Notes: Patient called with complaints of back pain and cold symptoms. Patient states that she was trying to help her aunt move on Saturday and she believes she may have lifted something heavy and caused back tweaking. Patient states back pain is moderate and okay when sitting, but erupts when moving from a sitting position to standing. Patient states that pain is locally in upper mid back and radiates down back slightly, and is aggravated with twisting, worse to the right side. Patient denies numbness/weakness, swelling, and tingling. Patient states she has taken [employer], [place], motrin  with no relief. Patient also states later that Saturday evening she began feeling a sore throat, body aches, headache, nasal congestions, and coughing. Patient denies fever besides possibly low grades, stating, My boyfriend said I felt a little warm yesterday, but states she has not taken temp due to no thermometer. Patient advised by this RN to be seen within 3 days per protocol, to which patient was agreeable. Appt scheduled for 12/25/23. Patient advised by this RN to call back with worsening symptoms. Patient verbalized understanding.    Copied from CRM [id number]. Topic: Clinical - Red Word Triage >> Dec 24, 2023  9:40 AM Graeme ORN wrote: Red Word that prompted transfer to Nurse Triage: back pain - hurt back helping someone move not really able to turn to right or bend over. Also has cold symptoms - sore throat, congestion, headache Reason for Disposition  [1] MODERATE back pain (e.g., interferes  with normal activities) AND [2] present > 3 days  Answer Assessment - Initial Assessment Questions 1. ONSET: When did the pain begin?      Saturday  2. LOCATION: Where does it hurt? (upper, mid or lower back)     Radiating down back, hurts more on right side and to the left 3. SEVERITY: How bad is the pain?  (e.g., Scale 1-10; mild, moderate, or severe)   - MILD (1-3): Doesn't interfere with normal activities.    - MODERATE (4-7): Interferes with normal activities or awakens from sleep.    - SEVERE (8-10): Excruciating pain, unable to do any normal activities.      Moderate (7/10) 4. PATTERN: Is the pain constant? (e.g., yes, no; constant, intermittent)      Intermittent 5. RADIATION: Does the pain shoot into your legs or somewhere else?     Down back 6. CAUSE:  What do you think is causing the back pain?      I was helping my aunt move and I guess I lifted something wrong  7. BACK OVERUSE:  Any recent lifting of heavy objects, strenuous work or exercise?     I was helping my aunt move and I guess I lifted something wrong and maybe may have tweaked my back.  8. MEDICINES: What have you taken so far for the pain? (e.g., nothing, acetaminophen , NSAIDS)     Bengay and IcyHot, motrin , no relief 9. NEUROLOGIC SYMPTOMS: Do you have any weakness, numbness, or problems with bowel/bladder control?     Denies 10. OTHER SYMPTOMS: Do you have any other symptoms? (e.g., fever, abdomen pain, burning with urination, blood in urine)       Coughing, body aches,  sore throat, headache, stuffy nose (congestion),  11. PREGNANCY: Is there any chance you are pregnant? When was your last menstrual period?       Denies  Protocols used: Back Pain-A-AH

## 2023-12-25 ENCOUNTER — Ambulatory Visit: Payer: 59 | Admitting: Family Medicine

## 2023-12-25 ENCOUNTER — Encounter: Payer: Self-pay | Admitting: Family Medicine

## 2023-12-25 VITALS — BP 102/64 | HR 115 | Temp 100.6°F | Ht 63.0 in | Wt 131.0 lb

## 2023-12-25 DIAGNOSIS — S46811A Strain of other muscles, fascia and tendons at shoulder and upper arm level, right arm, initial encounter: Secondary | ICD-10-CM | POA: Diagnosis not present

## 2023-12-25 DIAGNOSIS — R051 Acute cough: Secondary | ICD-10-CM | POA: Diagnosis not present

## 2023-12-25 DIAGNOSIS — R6889 Other general symptoms and signs: Secondary | ICD-10-CM | POA: Diagnosis not present

## 2023-12-25 LAB — POCT FLU A/B STATUS
Influenza A, POC: NEGATIVE
Influenza B, POC: NEGATIVE

## 2023-12-25 MED ORDER — CYCLOBENZAPRINE HCL 10 MG PO TABS
5.0000 mg | ORAL_TABLET | Freq: Every evening | ORAL | 0 refills | Status: DC | PRN
Start: 2023-12-25 — End: 2024-05-28

## 2023-12-25 NOTE — Assessment & Plan Note (Signed)
 Acute, new vertebral tenderness, cervical radiculopathy.  will treat with muscle relaxant cyclobenzaprine  5 to 10 mg p.o. nightly as needed muscle spasm.  can use ibuprofen  low dose for pain and inflammation, start home physical therapy and heat.  Return and ER precautions provided

## 2023-12-25 NOTE — Addendum Note (Signed)
Addended by: Donnamarie Poag on: 12/25/2023 11:43 AM   Modules accepted: Orders

## 2023-12-25 NOTE — Assessment & Plan Note (Signed)
 Acute, flu test negative in office.  Possible false negative, possible RSV infeciton.  Treat supportively with rest, fluids , can use Tylenol  or ibuprofen  for fever and symptom control.

## 2023-12-25 NOTE — Progress Notes (Signed)
 Patient ID: Stephanie [name], female    DOB: 1996/11/10, 28 y.o.   MRN: [id number]  This visit was conducted in person.  BP 102/64   Pulse (!) 115   Temp (!) 100.6 F (38.1 C) (Oral)   Ht 5' 3 (1.6 m)   Wt 131 lb (59.4 kg)   SpO2 99%   BMI 23.21 kg/m    CC:  Chief Complaint  Patient presents with   Back Pain    X 5 days right > left after helping someone move. Otc meds not helping much. Pain is 6/10 feels like pinch when moving. Increased pain with bending and twisting to right.    Nasal Congestion    X 4 days negative covid test had fever yesterday. Otc meds have not been helping much     Subjective:   HPI: Stephanie [name] is a 28 y.o. female presenting on 12/25/2023 for Back Pain (X 5 days right > left after helping someone move. Otc meds not helping much. Pain is 6/10 feels like pinch when moving. Increased pain with bending and twisting to right. ) and Nasal Congestion (X 4 days negative covid test had fever yesterday. Otc meds have not been helping much )  New onset pain in  upper back,  bilaterally, right greater than left.  Started after helping a friend move.  Pain is 6 out of 10 on pain scale.  No radiation of pain to arms, no weakness,  no numbness.  No cervical pain... pain in upper back with moving head. Using bengay and icy hot, doing some stretching.  Stiffness in upper back   She has also noted 4 days of nasal congestion,   headache, facial pressure, ST. This AM cough, chest pain with cough Fever 101.0 F ... negative  COVID test yesterday.  She works as paramedic... coworkers with RSV and flu.  Tylenol  , Nyquil and mucinex, Dayquil during the day.  Ibuprofen  causes pancreatitis in her.      Relevant past medical, surgical, family and social history reviewed and updated as indicated. Interim medical history since our last visit reviewed. Allergies and medications reviewed and updated. Outpatient Medications Prior to Visit  Medication Sig Dispense  Refill   acetaminophen  (TYLENOL ) 500 MG tablet Take 1,000 mg by mouth every 6 (six) hours as needed.     EPINEPHrine  0.3 mg/0.3 mL IJ SOAJ injection Inject 0.3 mg into the muscle as needed for anaphylaxis. 1 each 0   etonogestrel  (NEXPLANON ) 68 MG IMPL implant 1 each by Subdermal route continuous.     FLUoxetine  (PROZAC ) 20 MG tablet Take 1 tablet (20 mg total) by mouth daily. For anxiety 90 tablet 0   ondansetron  (ZOFRAN ) 4 MG tablet Take 1 tablet (4 mg total) by mouth every 6 (six) hours. 12 tablet 0   SUMAtriptan  (IMITREX ) 50 MG tablet Take 1 tablet by mouth at migraine onset. May repeat in 2 hours if headache persists or recurs. 10 tablet 0   cyclobenzaprine  (FLEXERIL ) 5 MG tablet Take 1 tablet (5 mg total) by mouth 3 (three) times daily as needed for muscle spasms. 15 tablet 0   clindamycin  (CLINDAGEL) 1 % gel Apply topically 2 (two) times daily. (Patient not taking: Reported on 12/25/2023) 30 g 0   Fluoxetine  HCl, PMDD, 20 MG TABS Take by mouth. (Patient not taking: Reported on 12/25/2023)     propranolol  ER (INDERAL  LA) 120 MG 24 hr capsule Take 1 capsule (120 mg total) by mouth at bedtime.  For headache prevention (Patient not taking: Reported on 12/25/2023) 90 capsule 0   ondansetron  (ZOFRAN  ODT) 4 MG disintegrating tablet Take 1-2 tablets (4-8 mg total) by mouth every 8 (eight) hours as needed for nausea or vomiting. 30 tablet 0   No facility-administered medications prior to visit.     Per HPI unless specifically indicated in ROS section below Review of Systems  Constitutional:  Negative for fatigue and fever.  HENT:  Negative for congestion.   Eyes:  Negative for pain.  Respiratory:  Negative for cough and shortness of breath.   Cardiovascular:  Negative for chest pain, palpitations and leg swelling.  Gastrointestinal:  Negative for abdominal pain.  Genitourinary:  Negative for dysuria and vaginal bleeding.  Musculoskeletal:  Negative for back pain.  Neurological:  Negative for  syncope, light-headedness and headaches.  Psychiatric/Behavioral:  Negative for dysphoric mood.    Objective:  BP 102/64   Pulse (!) 115   Temp (!) 100.6 F (38.1 C) (Oral)   Ht 5' 3 (1.6 m)   Wt 131 lb (59.4 kg)   SpO2 99%   BMI 23.21 kg/m   Wt Readings from Last 3 Encounters:  12/25/23 131 lb (59.4 kg)  09/11/23 135 lb (61.2 kg)  07/29/23 133 lb (60.3 kg)      Physical Exam Constitutional:      General: She is not in acute distress.    Appearance: She is well-developed. She is not ill-appearing or toxic-appearing.  HENT:     Head: Normocephalic.     Right Ear: Hearing, tympanic membrane, ear canal and external ear normal. Tympanic membrane is not erythematous, retracted or bulging.     Left Ear: Hearing, tympanic membrane, ear canal and external ear normal. Tympanic membrane is not erythematous, retracted or bulging.     Nose: Mucosal edema and rhinorrhea present.     Right Sinus: No maxillary sinus tenderness or frontal sinus tenderness.     Left Sinus: No maxillary sinus tenderness or frontal sinus tenderness.     Mouth/Throat:     Pharynx: Uvula midline.  Eyes:     General: Lids are normal. Lids are everted, no foreign bodies appreciated.     Conjunctiva/sclera: Conjunctivae normal.     Pupils: Pupils are equal, round, and reactive to light.  Neck:     Thyroid : No thyroid  mass or thyromegaly.     Vascular: No carotid bruit.     Trachea: Trachea normal.  Cardiovascular:     Rate and Rhythm: Normal rate and regular rhythm.     Pulses: Normal pulses.     Heart sounds: Normal heart sounds, S1 normal and S2 normal. No murmur heard.    No friction rub. No gallop.  Pulmonary:     Effort: Pulmonary effort is normal. No tachypnea or respiratory distress.     Breath sounds: Normal breath sounds. No decreased breath sounds, wheezing, rhonchi or rales.  Musculoskeletal:     Cervical back: Neck supple. No bony tenderness. No pain with movement. Decreased range of motion.      Thoracic back: Spasms, tenderness and bony tenderness present. Decreased range of motion.  Skin:    General: Skin is warm and dry.     Findings: No rash.  Neurological:     Mental Status: She is alert.  Psychiatric:        Mood and Affect: Mood is not anxious or depressed.        Speech: Speech normal.  Behavior: Behavior normal. Behavior is cooperative.        Judgment: Judgment normal.       Results for orders placed or performed in visit on 10/10/23  Celiac Pnl 2 rflx Endomysial Ab Ttr   Collection Time: 10/10/23  9:56 AM  Result Value Ref Range   Gliadin IgG 23.5 (H) U/mL   Gliadin IgA 14.8 U/mL   (tTG) Ab, IgG <1.0 U/mL   (tTG) Ab, IgA <1.0 U/mL   Endomysial Ab IgA NEGATIVE NEGATIVE   Endomysial Titer CANCELED    Immunoglobulin A 125 47 - 310 mg/dL    Assessment and Plan  Acute cough  Trapezius strain, right, initial encounter Assessment & Plan: Acute, new vertebral tenderness, cervical radiculopathy.  will treat with muscle relaxant cyclobenzaprine  5 to 10 mg p.o. nightly as needed muscle spasm.  can use ibuprofen  low dose for pain and inflammation, start home physical therapy and heat.  Return and ER precautions provided   Flu-like symptoms Assessment & Plan: Acute, flu test negative in office.  Possible false negative, possible RSV infeciton.  Treat supportively with rest, fluids , can use Tylenol  or ibuprofen  for fever and symptom control.   Other orders -     Cyclobenzaprine  HCl; Take 0.5-1 tablets (5-10 mg total) by mouth at bedtime as needed.  Dispense: 15 tablet; Refill: 0    No follow-ups on file.   Greig Ring, MD

## 2024-01-09 ENCOUNTER — Ambulatory Visit: Payer: Medicaid Other | Admitting: Primary Care

## 2024-01-13 ENCOUNTER — Ambulatory Visit: Payer: Medicaid Other | Admitting: Primary Care

## 2024-02-24 ENCOUNTER — Ambulatory Visit: Payer: Self-pay

## 2024-02-24 ENCOUNTER — Encounter: Payer: Self-pay | Admitting: Internal Medicine

## 2024-02-24 ENCOUNTER — Ambulatory Visit (INDEPENDENT_AMBULATORY_CARE_PROVIDER_SITE_OTHER): Admitting: Internal Medicine

## 2024-02-24 VITALS — BP 120/70 | HR 87 | Temp 98.2°F | Ht 63.0 in | Wt 139.2 lb

## 2024-02-24 DIAGNOSIS — J0110 Acute frontal sinusitis, unspecified: Secondary | ICD-10-CM

## 2024-02-24 DIAGNOSIS — J302 Other seasonal allergic rhinitis: Secondary | ICD-10-CM

## 2024-02-24 MED ORDER — AMOXICILLIN 500 MG PO CAPS
500.0000 mg | ORAL_CAPSULE | Freq: Three times a day (TID) | ORAL | 0 refills | Status: AC
Start: 2024-02-24 — End: 2024-03-02

## 2024-02-24 MED ORDER — PREDNISONE 10 MG (21) PO TBPK
ORAL_TABLET | ORAL | 0 refills | Status: DC
Start: 2024-02-24 — End: 2024-05-28

## 2024-02-24 NOTE — Telephone Encounter (Signed)
 Noted. Schedule With Family Medicine Salvatore Decent, FNP) 02/24/2024 at 3:00 PM

## 2024-02-24 NOTE — Telephone Encounter (Signed)
 Noted and appreciate Ashley's evaluation.

## 2024-02-24 NOTE — Progress Notes (Signed)
 [facility] [address] [place] [address] Dept: [phone] Dept Fax: [phone]  Acute Care Office Visit  Subjective:   Stephanie [name] 05/23/1996 02/24/2024  Chief Complaint  Patient presents with   Sinus Problem   Nasal Congestion    Sob itching throat started on 1/1/2 weeks ago    HPI: URI SYMPTOMS: Onset: 2 weeks ago . Has seasonal allergies, but symptoms have been continually worsening.   Fever: no Runny nose: no  Nasal congestion: yes  Sinus pressure: yes - left frontal  Post nasal drip: yes  Cough: yes - dry  Sneezing: yes  Itchy/Watery eyes: yes  Ear pain: no - some fullness sensation in left ear Sore throat: no , but does itch  Also reports feeling slightly short of breath , "tightness"   Treatments tried: Cetirizine, Claritin, Allegra - helps with itchy eyes, sneezing       The following portions of the patient's history were reviewed and updated as appropriate: past medical history, past surgical history, family history, social history, allergies, medications, and problem list.   Patient Active Problem List   Diagnosis Date Noted   Flu-like symptoms 12/25/2023   Trapezius strain, right, initial encounter 12/25/2023   Acute thoracic back pain 09/11/2023   Full body hives 09/11/2023   Amenorrhea 10/05/2022   Left upper quadrant abdominal pain 04/20/2022   Paresthesia 12/12/2021   History of incision and drainage 12/08/2021   Abscess of right axilla 12/07/2021   Headache, new daily persistent ([facility]) 08/02/2021   Breast mass in female 12/22/2020   Dysmenorrhea 08/30/2020   Flank pain 07/10/2019   Near syncope 06/05/2019   Nausea and vomiting 04/20/2019   Axillary hidradenitis suppurativa 03/05/2019   Recurrent infection of skin 02/04/2019   Preventative health care 11/20/2017   Menorrhagia with regular cycle 09/24/2014   Migraines 08/04/2014   Allergic rhinitis 04/03/2013    MDD (major depressive disorder), single episode, severe (HCC) 01/28/2013    Class: Chronic   Generalized anxiety disorder 01/28/2013   Past Medical History:  Diagnosis Date   Acute pancreatitis without infection or necrosis 04/21/2019   Anemia    Anxiety 01/28/2013   no meds   Depression    no meds   Hidradenitis    IBS (irritable bowel syndrome)    Migraines    HA every other day - otc med prn   Recurrent streptococcal tonsillitis    Seasonal allergies    Seasonal environmental allergies   Sebaceous cyst 08/04/2018   L mid back    Vision abnormalities    Myopia; patient has history of devloping ulcers when she wore contacts.   Past Surgical History:  Procedure Laterality Date   COLONOSCOPY     IBS   COLONOSCOPY     IBS   IRRIGATION AND DEBRIDEMENT ABSCESS Right 12/07/2021   Procedure: IRRIGATION AND DEBRIDEMENT AXILLARY ABSCESS;  Surgeon: Axel Filler, MD;  Location: [facility] OR;  Service: General;  Laterality: Right;   LAPAROSCOPY N/A 10/20/2018   Procedure: LAPAROSCOPY DIAGNOSTIC possible removal of endometriosis;  Surgeon: Mitchel Honour, DO;  Location: [facility];  Service: Gynecology;  Laterality: N/A;   UPPER GASTROINTESTINAL ENDOSCOPY  03/04/2020   Family History  Problem Relation Age of Onset   Depression Mother    Migraines Mother    Irritable bowel syndrome Mother    Hypertension Mother    Migraines Father    Diabetes Father    Diabetes Maternal Grandmother    Hypertension  Maternal Grandmother    Diabetes Sister    Stomach cancer Sister 3   Colon cancer Neg Hx    Esophageal cancer Neg Hx    Rectal cancer Neg Hx     Current Outpatient Medications:    acetaminophen (TYLENOL) 500 MG tablet, Take 1,000 mg by mouth every 6 (six) hours as needed., Disp: , Rfl:    amoxicillin (AMOXIL) 500 MG capsule, Take 1 capsule (500 mg total) by mouth 3 (three) times daily for 7 days., Disp: 21 capsule, Rfl: 0   EPINEPHrine 0.3 mg/0.3 mL IJ SOAJ injection, Inject 0.3 mg into  the muscle as needed for anaphylaxis., Disp: 1 each, Rfl: 0   FLUoxetine (PROZAC) 20 MG tablet, Take 1 tablet (20 mg total) by mouth daily. For anxiety, Disp: 90 tablet, Rfl: 0   ondansetron (ZOFRAN) 4 MG tablet, Take 1 tablet (4 mg total) by mouth every 6 (six) hours., Disp: 12 tablet, Rfl: 0   predniSONE (STERAPRED UNI-PAK 21 TAB) 10 MG (21) TBPK tablet, Take as directed on packaging., Disp: 21 tablet, Rfl: 0   propranolol ER (INDERAL LA) 120 MG 24 hr capsule, Take 1 capsule (120 mg total) by mouth at bedtime. For headache prevention, Disp: 90 capsule, Rfl: 0   SUMAtriptan (IMITREX) 50 MG tablet, Take 1 tablet by mouth at migraine onset. May repeat in 2 hours if headache persists or recurs., Disp: 10 tablet, Rfl: 0   clindamycin (CLINDAGEL) 1 % gel, Apply topically 2 (two) times daily. (Patient not taking: Reported on 12/25/2023), Disp: 30 g, Rfl: 0   cyclobenzaprine (FLEXERIL) 10 MG tablet, Take 0.5-1 tablets (5-10 mg total) by mouth at bedtime as needed. (Patient not taking: Reported on 02/24/2024), Disp: 15 tablet, Rfl: 0   etonogestrel (NEXPLANON) 68 MG IMPL implant, 1 each by Subdermal route continuous. (Patient not taking: Reported on 02/24/2024), Disp: , Rfl:    Fluoxetine HCl, PMDD, 20 MG TABS, Take by mouth. (Patient not taking: Reported on 12/25/2023), Disp: , Rfl:  Allergies  Allergen Reactions   Almond (Diagnostic) Other (See Comments)    [place]   Coconut Flavoring Agent (Non-Screening) Other (See Comments)    Mouth itches, but no impaired breathing (per patient   Septra [Sulfamethoxazole-Trimethoprim] Nausea Only and Other (See Comments)    Patient took a few courses of this and it ended up being stopped by a MD because it had started causing "bad stomach pain" (per the patient)     ROS: A complete ROS was performed with pertinent positives/negatives noted in the HPI. The remainder of the ROS are negative.    Objective:   Today's Vitals   02/24/24 1457  BP: 120/70  Pulse: 87   Temp: 98.2 F (36.8 C)  TempSrc: Temporal  SpO2: 99%  Weight: 139 lb 3.2 oz (63.1 kg)  Height: 5\' 3"  (1.6 m)    GENERAL: Well-appearing, in NAD. Well nourished.  SKIN: Pink, warm and dry. No rash.  HEENT:    HEAD: Normocephalic, non-traumatic.  EYES: Conjunctive pink without exudate. PERRL.  EARS: External ear w/o redness, swelling, masses, or lesions. EAC clear. TM's intact, translucent w/o bulging, appropriate landmarks visualized.  NOSE: Septum midline w/o deformity. Nares patent, mucosa pink and inflamed w/o drainage. Left frontal sinus tenderness. THROAT: Uvula midline. Oropharynx with clear - yellow PND. Tonsils non-inflamed w/o exudate. Mucus membranes pink and moist.  NECK: Trachea midline. Full ROM w/o pain or tenderness. No lymphadenopathy.  RESPIRATORY: Chest wall symmetrical. Respirations even and non-labored. Breath sounds clear to  auscultation bilaterally.  CARDIAC: S1, S2 present, regular rate and rhythm. Peripheral pulses 2+ bilaterally.  EXTREMITIES: Without clubbing, cyanosis, or edema.  NEUROLOGIC: Steady, even gait.  PSYCH/MENTAL STATUS: Alert, oriented x 3. Cooperative, appropriate mood and affect.    No results found for any visits on 02/24/24.    Assessment & Plan:  1. Acute non-recurrent frontal sinusitis (Primary) - amoxicillin (AMOXIL) 500 MG capsule; Take 1 capsule (500 mg total) by mouth 3 (three) times daily for 7 days.  Dispense: 21 capsule; Refill: 0 - predniSONE (STERAPRED UNI-PAK 21 TAB) 10 MG (21) TBPK tablet; Take as directed on packaging.  Dispense: 21 tablet; Refill: 0  2. Seasonal allergies - Continue Zyrtec OR allegra once daily   Meds ordered this encounter  Medications   amoxicillin (AMOXIL) 500 MG capsule    Sig: Take 1 capsule (500 mg total) by mouth 3 (three) times daily for 7 days.    Dispense:  21 capsule    Refill:  0    Supervising Provider:   Garnette Gunner [[id number]]   predniSONE (STERAPRED UNI-PAK 21 TAB) 10 MG (21) TBPK  tablet    Sig: Take as directed on packaging.    Dispense:  21 tablet    Refill:  0    Supervising Provider:   Garnette Gunner [[id number]]   No orders of the defined types were placed in this encounter.  Lab Orders  No laboratory test(s) ordered today   No images are attached to the encounter or orders placed in the encounter.  Return if symptoms worsen or fail to improve.   Salvatore Decent, FNP

## 2024-02-24 NOTE — Telephone Encounter (Signed)
 Copied from CRM [id number]. Topic: Clinical - Red Word Triage >> Feb 24, 2024 10:40 AM Stephanie [name] wrote: [facility] that prompted transfer to Nurse Triage: headache left side, shortness of breath, congestion, did not sleep well pressure in her chest when she is breathing   Chief Complaint: Congestion/Shortness of breath/Dry cough Symptoms: congestion/pressure, dry cough, sneezing, watery/itchy eyes, itchy throat Frequency: 2 weeks Pertinent Negatives: Patient denies dizziness, chest pain, fever, runny nose Disposition: [] ED /[] Urgent Care (no appt availability in office) / [x] Appointment(In office/virtual)/ []  [facility]/ [] Home Care/ [] Refused Recommended Disposition /[] [facility]/ []  Follow-up with PCP Additional Notes: Patient called and advised that for the past two weeks she has been sick and hadn't had time to be able to make an appointment to be seen.  She said it started with sneezing and now she is having congestion, dry cough, sinus pressure, watery/itchy eyes, itchy throat, and some shortness of breath on exertion. Patient speaking in full complete sentences with no obvious abnormal breathing that can be heard over the phone.  Patient denies any dizziness, chest pain, fever, runny nose. Appointment is made for today 02/24/2024 at 3pm at the [place] office with Salvatore Decent. Patient requested an appointment at the [facility] office today due to her being at work and trying to find coverage to be able to make it to an appointment. Patient is given Care Advice as per protocol and advised that if anything worsens, to go to the Emergency Room.  Patient verbalized understanding.  Reason for Disposition  [1] MILD difficulty breathing (e.[name]., minimal/no SOB at rest, SOB with walking, pulse <100) AND [2] NEW-onset or WORSE than normal  Answer Assessment - Initial Assessment Questions 1. RESPIRATORY STATUS: "Describe your breathing?" (e.[name]., wheezing, shortness of breath, unable  to speak, severe coughing)      Having to stop and take a break when going up steps or walking her dog 2. ONSET: "When did this breathing problem begin?"      X 1 week 3. PATTERN "Does the difficult breathing come and go, or has it been constant since it started?"      constant 4. SEVERITY: "How bad is your breathing?" (e.[name]., mild, moderate, severe)    - MILD: No SOB at rest, mild SOB with walking, speaks normally in sentences, can lie down, no retractions, pulse < 100.    - MODERATE: SOB at rest, SOB with minimal exertion and prefers to sit, cannot lie down flat, speaks in phrases, mild retractions, audible wheezing, pulse 100-120.    - SEVERE: Very SOB at rest, speaks in single words, struggling to breathe, sitting hunched forward, retractions, pulse > 120      Hard time breathing when wearing a mask at work, exertion makes it worse 5. RECURRENT SYMPTOM: "Have you had difficulty breathing before?" If Yes, ask: "When was the last time?" and "What happened that time?"      "Not related to allergies 6. CARDIAC HISTORY: "Do you have any history of heart disease?" (e.[name]., heart attack, angina, bypass surgery, angioplasty)      No 7. LUNG HISTORY: "Do you have any history of lung disease?"  (e.[name]., pulmonary embolus, asthma, emphysema)     No 8. CAUSE: "What do you think is causing the breathing problem?"      "Maybe congestion" 9. OTHER SYMPTOMS: "Do you have any other symptoms? (e.[name]., dizziness, runny nose, cough, chest pain, fever)     Dry cough 10. O2 SATURATION MONITOR:  "Do you  use an oxygen saturation monitor (pulse oximeter) at home?" If Yes, ask: "What is your reading (oxygen level) today?" "What is your usual oxygen saturation reading?" (e.[name]., 95%)       N/a 11. PREGNANCY: "Is there any chance you are pregnant?" "When was your last menstrual period?"       "I dont think so---March 19th 12. TRAVEL: "Have you traveled out of the country in the last month?" (e.[name]., travel history,  exposures)       No  Protocols used: Breathing Difficulty-A-AH

## 2024-02-24 NOTE — Patient Instructions (Signed)
 Continue Zyrtec OR Allegra once daily

## 2024-04-17 ENCOUNTER — Other Ambulatory Visit (HOSPITAL_COMMUNITY): Payer: Self-pay

## 2024-05-28 ENCOUNTER — Encounter: Payer: Self-pay | Admitting: Primary Care

## 2024-05-28 ENCOUNTER — Ambulatory Visit: Payer: Self-pay | Admitting: Primary Care

## 2024-05-28 ENCOUNTER — Ambulatory Visit: Admitting: Primary Care

## 2024-05-28 VITALS — BP 118/70 | HR 110 | Temp 98.2°F | Ht 63.0 in | Wt 127.0 lb

## 2024-05-28 DIAGNOSIS — Z3201 Encounter for pregnancy test, result positive: Secondary | ICD-10-CM | POA: Diagnosis not present

## 2024-05-28 LAB — HCG, QUANTITATIVE, PREGNANCY: Quantitative HCG: >1420 m[IU]/mL

## 2024-05-28 NOTE — Progress Notes (Signed)
 Subjective:    Patient ID: Stephanie [name], female    DOB: 1996-07-09, 28 y.o.   MRN: [id number]  Possible Pregnancy Associated symptoms include headaches, nausea and vomiting.    Stephanie [name] is a very pleasant 28 y.o. female with a history of nausea and vomiting, dysmenorrhea, menorrhagia who presents today for positive urine pregnancy test.  She has taken 5 urine pregnancy tests this week, all 5 tested were positive for pregnancy.  She had her Nexplanon  removed 4 months ago. She has also noticed symptoms of light pink/brown spotting once on Monday and Tuesday this week, mild pelvic cramping, lower back pain, nausea, headache. She vomited 3 times this morning.  She has not noticed bleeding or spotting since Tuesday.  She would like a new OB/GYN. LMP was 04/28/24. She is not taking a prenatal vitamin, but has questions about which one to take. Stopped all of her medications about 1 month ago as she moved. She's doing well off the fluoxetine  overall.   Review of Systems  Gastrointestinal:  Positive for nausea and vomiting.  Genitourinary:  Positive for menstrual problem, pelvic pain and vaginal bleeding.  Neurological:  Positive for headaches.         Past Medical History:  Diagnosis Date   Acute pancreatitis without infection or necrosis 04/21/2019   Anemia    Anxiety 01/28/2013   no meds   Depression    no meds   Hidradenitis    IBS (irritable bowel syndrome)    Migraines    HA every other day - otc med prn   Recurrent streptococcal tonsillitis    Seasonal allergies    Seasonal environmental allergies   Sebaceous cyst 08/04/2018   L mid back    Vision abnormalities    Myopia; patient has history of devloping ulcers when she wore contacts.    Social History   Socioeconomic History   Marital status: Single    Spouse name: Not on file   Number of children: 0   Years of education: Not on file   Highest education level: Not on file  Occupational  History   Occupation: Nanny  Tobacco Use   Smoking status: Never   Smokeless tobacco: Never  Vaping Use   Vaping status: Never Used  Substance and Sexual Activity   Alcohol use: Yes    Comment: socially   Drug use: Yes    Types: Marijuana    Comment: Last use Thursday 10/16/18   Sexual activity: Not Currently    Partners: Male    Birth control/protection: Implant    Comment: broke up with first sexual partner 1 month ago  Other Topics Concern   Not on file  Social History Narrative   Single.    Student at [employer].   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    Enjoys listening to music, hanging out with friends.    Social Drivers of Corporate investment banker Strain: Not on file  Food Insecurity: Not on file  Transportation Needs: Not on file  Physical Activity: Not on file  Stress: Not on file  Social Connections: Not on file  Intimate Partner Violence: Not on file    Past Surgical History:  Procedure Laterality Date   COLONOSCOPY     IBS   COLONOSCOPY     IBS   IRRIGATION AND DEBRIDEMENT ABSCESS Right 12/07/2021   Procedure: IRRIGATION AND DEBRIDEMENT AXILLARY ABSCESS;  Surgeon: Rubin Calamity, MD;  Location: [facility] OR;  Service: General;  Laterality: Right;   LAPAROSCOPY N/A 10/20/2018   Procedure: LAPAROSCOPY DIAGNOSTIC possible removal of endometriosis;  Surgeon: Dannielle Bouchard, DO;  Location: [facility] ORS;  Service: Gynecology;  Laterality: N/A;   UPPER GASTROINTESTINAL ENDOSCOPY  03/04/2020    Family History  Problem Relation Age of Onset   Depression Mother    Migraines Mother    Irritable bowel syndrome Mother    Hypertension Mother    Migraines Father    Diabetes Father    Diabetes Maternal Grandmother    Hypertension Maternal Grandmother    Diabetes Sister    Stomach cancer Sister 47   Colon cancer Neg Hx    Esophageal cancer Neg Hx    Rectal cancer Neg Hx     Allergies  Allergen Reactions   Almond (Diagnostic) Other (See Comments)     [place]   Coconut Flavoring Agent (Non-Screening) Other (See Comments)    Mouth itches, but no impaired breathing (per patient   Septra  [Sulfamethoxazole -Trimethoprim ] Nausea Only and Other (See Comments)    Patient took a few courses of this and it ended up being stopped by a MD because it had started causing bad stomach pain (per the patient)    Current Outpatient Medications on File Prior to Visit  Medication Sig Dispense Refill   acetaminophen  (TYLENOL ) 500 MG tablet Take 1,000 mg by mouth every 6 (six) hours as needed.     EPINEPHrine  0.3 mg/0.3 mL IJ SOAJ injection Inject 0.3 mg into the muscle as needed for anaphylaxis. 1 each 0   clindamycin  (CLINDAGEL) 1 % gel Apply topically 2 (two) times daily. (Patient not taking: Reported on 05/28/2024) 30 g 0   SUMAtriptan  (IMITREX ) 50 MG tablet Take 1 tablet by mouth at migraine onset. May repeat in 2 hours if headache persists or recurs. (Patient not taking: Reported on 05/28/2024) 10 tablet 0   [DISCONTINUED] dicyclomine  (BENTYL ) 20 MG tablet Take 1 tablet (20 mg total) by mouth 2 (two) times daily. (Patient not taking: Reported on 12/25/2023) 20 tablet 0   [DISCONTINUED] famotidine  (PEPCID ) 20 MG tablet Take 1 tablet (20 mg total) by mouth 2 (two) times daily. 30 tablet 0   [DISCONTINUED] metoCLOPramide  (REGLAN ) 10 MG tablet Take 1 tablet (10 mg total) by mouth every 6 (six) hours as needed for nausea or vomiting. (Patient not taking: Reported on 09/19/2019) 30 tablet 0   [DISCONTINUED] pantoprazole  (PROTONIX ) 40 MG tablet Take 40 mg twice a day for 8 weeks 120 tablet 1   [DISCONTINUED] promethazine  (PHENERGAN ) 25 MG suppository Place 1 suppository (25 mg total) rectally every 6 (six) hours as needed for nausea or vomiting. (Patient not taking: Reported on 03/04/2020) 12 each 0   [DISCONTINUED] sucralfate  (CARAFATE ) 1 GM/10ML suspension Take 10 mLs (1 g total) by mouth 4 (four) times daily -  with meals and at bedtime. 420 mL 0   No current  facility-administered medications on file prior to visit.    BP 118/70   Pulse (!) 110   Temp 98.2 F (36.8 C) (Temporal)   Ht 5' 3 (1.6 m)   Wt 127 lb (57.6 kg)   LMP 04/28/2024 (Exact Date)   SpO2 99%   BMI 22.50 kg/m  Objective:   Physical Exam Cardiovascular:     Rate and Rhythm: Normal rate and regular rhythm.  Pulmonary:     Effort: Pulmonary effort is normal.     Breath sounds: Normal breath sounds.  Musculoskeletal:     Cervical back: Neck supple.  Skin:  General: Skin is warm and dry.  Neurological:     Mental Status: She is alert and oriented to person, place, and time.  Psychiatric:        Mood and Affect: Mood normal.           Assessment & Plan:  Positive urine pregnancy test Assessment & Plan: Labs pending today for hCG quantitative.  We discussed options for nausea. We also discussed prenatal vitamin options. We also discussed warning signs for miscarriage and when to report.  She seems to be doing well overall off of her fluoxetine , but we discussed safe options for anxiety treatment during pregnancy.  She will notify if she develops the symptoms.  Referral placed to OB/GYN.  Orders: -     hCG, quantitative, pregnancy -     Ambulatory referral to Obstetrics / Gynecology        Comer MARLA Gaskins, NP

## 2024-05-28 NOTE — Assessment & Plan Note (Addendum)
 Labs pending today for hCG quantitative.  We discussed options for nausea. We also discussed prenatal vitamin options. We also discussed warning signs for miscarriage and when to report.  She seems to be doing well overall off of her fluoxetine , but we discussed safe options for anxiety treatment during pregnancy.  She will notify if she develops the symptoms.  Referral placed to OB/GYN.

## 2024-05-28 NOTE — Patient Instructions (Addendum)
 Stop by the lab prior to leaving today. I will notify you of your results once received.   Ginger, vitamin B6, Diclegis medication can help with nausea.   You will either be contacted via phone regarding your referral to OB/GYN, or you may receive a letter on your MyChart portal from our referral team with instructions for scheduling an appointment. Please let us  know if you have not been contacted by anyone within two weeks.  It was a pleasure to see you today!

## 2024-06-01 ENCOUNTER — Ambulatory Visit: Payer: Self-pay

## 2024-06-01 NOTE — Telephone Encounter (Signed)
 Noted, will evaluate.

## 2024-06-01 NOTE — Telephone Encounter (Signed)
 FYI Only or Action Required?: FYI only for provider.  Patient was last seen in primary care on 05/28/2024 by Gretta Comer POUR, NP.  Called Nurse Triage reporting Vaginal Bleeding.  Symptoms began today.  Interventions attempted: Nothing.  Symptoms are: headaches (constant x 1.5 weeks), seeing floaters/spots, lower pelvic cramps, vaginal  brown/pink spotting stable.  Triage Disposition: See Physician Within 24 Hours  Patient/caregiver understands and will follow disposition?: Yes            Copied from CRM [id number]. Topic: Clinical - Red Word Triage >> Jun 01, 2024  8:24 AM Larissa RAMAN wrote: [facility] that prompted transfer to Nurse Triage: bleeding. Pt is pregnant Reason for Disposition  [1] Intermittent lower abdominal pain (e.g., cramping) AND [2] present > 24 hours  Answer Assessment - Initial Assessment Questions 1. ONSET: When did this bleeding start?       This morning.  2. BLEEDING SEVERITY: Describe the bleeding that you are having. How much bleeding is there?      Brownish-pink blood. 2 streaks of the blood on the toilet tissue.  3. ABDOMEN PAIN: Do you have any pain? How bad is the pain?  (e.g., Scale 0-10; none, mild, moderate, or severe)     Left and right side pelvic cramping. 4/10.  4. PREGNANCY: Do you know how many weeks or months pregnant you are? When was the first day of your last normal menstrual period?     Closer to 6 weeks. June 10th, 2025.  5. ULTRASOUND: Have you had an ultrasound during this pregnancy?  Note: To confirm intrauterine pregnancy, placenta location.     No, not yet. Patient states the OBGYN was supposed to reach out but she has not heard back.  6. HEMODYNAMIC STATUS: Are you weak or feeling lightheaded? If Yes, ask: Can you stand and walk normally?      Patient is able to stand and walk around normally. She statse she feels a little fatigued.  7. OTHER SYMPTOMS: What other symptoms are you having with the  bleeding? (e.g., passed tissue, vaginal discharge, fever, menstrual-type cramps)     Headache (5/10, constant for 1.5 weeks), visual changes seeing spots in the corner of my eye. Patient denies fever, unilateral numbness or weakness, facial droop, changes in speech.  Protocols used: Pregnancy - Vaginal Bleeding Less Than [redacted] Weeks EGA-A-AH

## 2024-06-02 ENCOUNTER — Ambulatory Visit: Payer: Self-pay | Admitting: Primary Care

## 2024-06-02 ENCOUNTER — Ambulatory Visit (HOSPITAL_BASED_OUTPATIENT_CLINIC_OR_DEPARTMENT_OTHER)
Admission: RE | Admit: 2024-06-02 | Discharge: 2024-06-02 | Disposition: A | Discharge status: Home / Self Care | Source: Ambulatory Visit | Attending: Primary Care | Admitting: Primary Care

## 2024-06-02 ENCOUNTER — Ambulatory Visit (INDEPENDENT_AMBULATORY_CARE_PROVIDER_SITE_OTHER): Admitting: Primary Care

## 2024-06-02 ENCOUNTER — Encounter: Payer: Self-pay | Admitting: Primary Care

## 2024-06-02 VITALS — BP 126/68 | HR 112 | Temp 98.4°F | Ht 63.0 in | Wt 126.0 lb

## 2024-06-02 DIAGNOSIS — R102 Pelvic and perineal pain: Secondary | ICD-10-CM

## 2024-06-02 DIAGNOSIS — Z3A01 Less than 8 weeks gestation of pregnancy: Secondary | ICD-10-CM | POA: Diagnosis not present

## 2024-06-02 DIAGNOSIS — O209 Hemorrhage in early pregnancy, unspecified: Secondary | ICD-10-CM

## 2024-06-02 LAB — CBC
HCT: 39.8 % (ref 36.0–46.0)
Hemoglobin: 13.3 g/dL (ref 12.0–15.0)
MCHC: 33.4 g/dL (ref 30.0–36.0)
MCV: 89.4 fl (ref 78.0–100.0)
Platelets: 276 K/uL (ref 150.0–400.0)
RBC: 4.45 Mil/uL (ref 3.87–5.11)
RDW: 13.5 % (ref 11.5–15.5)
WBC: 5.6 K/uL (ref 4.0–10.5)

## 2024-06-02 LAB — HCG, QUANTITATIVE, PREGNANCY: Quantitative HCG: 19287 m[IU]/mL

## 2024-06-02 NOTE — Progress Notes (Signed)
 Subjective:    Patient ID: Stephanie [name], female    DOB: 1996/01/20, 28 y.o.   MRN: [id number]  Pelvic Pain The patient's primary symptoms include pelvic pain. Associated symptoms include nausea and vomiting.    Stephanie [name] is a very pleasant 28 y.o. female with a history of near syncope, migraines, endometriosis, nausea and vomiting, MDD, GAD who presents today to discuss pelvic cramping.   She was last evaluated on 05/28/2024 for symptoms of light pink/brown spotting, mild pelvic cramping, lower back pain, nausea with headaches.  She had 5 positive urine pregnancy tests at home.  Her hCG quantitative lab was consistent with pregnancy with level >1420.  Symptoms began yesterday morning with a mild amount of pink/brown vaginal bleeding that she noticed on the toilet paper. This morning she noticed the same vaginal spotting after having a bowel movement. She has not noticed bleeding today.   Last night she developed intense bilateral lower pelvic cramping which lasted 2 hours. Since then she's noticed intermittent pelvic cramping, less intense. Also with lower back pain, nausea with vomiting. She does have an OB/GYN, has an appointment 07/08/24. Her last episode of vaginal spotting was this morning. She is taking a prenatal vitamin and nothing else.    Review of Systems  Gastrointestinal:  Positive for nausea and vomiting.  Genitourinary:  Positive for pelvic pain and vaginal bleeding.         Past Medical History:  Diagnosis Date   Acute pancreatitis without infection or necrosis 04/21/2019   Anemia    Anxiety 01/28/2013   no meds   Depression    no meds   Hidradenitis    IBS (irritable bowel syndrome)    Migraines    HA every other day - otc med prn   Recurrent streptococcal tonsillitis    Seasonal allergies    Seasonal environmental allergies   Sebaceous cyst 08/04/2018   L mid back    Vision abnormalities    Myopia; patient has history of devloping  ulcers when she wore contacts.    Social History   Socioeconomic History   Marital status: Single    Spouse name: Not on file   Number of children: 0   Years of education: Not on file   Highest education level: Not on file  Occupational History   Occupation: Nanny  Tobacco Use   Smoking status: Never   Smokeless tobacco: Never  Vaping Use   Vaping status: Never Used  Substance and Sexual Activity   Alcohol use: Yes    Comment: socially   Drug use: Yes    Types: Marijuana    Comment: Last use Thursday 10/16/18   Sexual activity: Not Currently    Partners: Male    Birth control/protection: Implant    Comment: broke up with first sexual partner 1 month ago  Other Topics Concern   Not on file  Social History Narrative   Single.    Student at [employer].   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    Enjoys listening to music, hanging out with friends.    Social Drivers of Corporate investment banker Strain: Not on file  Food Insecurity: Not on file  Transportation Needs: Not on file  Physical Activity: Not on file  Stress: Not on file  Social Connections: Not on file  Intimate Partner Violence: Not on file    Past Surgical History:  Procedure Laterality Date   COLONOSCOPY     IBS  COLONOSCOPY     IBS   IRRIGATION AND DEBRIDEMENT ABSCESS Right 12/07/2021   Procedure: IRRIGATION AND DEBRIDEMENT AXILLARY ABSCESS;  Surgeon: Rubin Calamity, MD;  Location: [facility] OR;  Service: General;  Laterality: Right;   LAPAROSCOPY N/A 10/20/2018   Procedure: LAPAROSCOPY DIAGNOSTIC possible removal of endometriosis;  Surgeon: Dannielle Bouchard, DO;  Location: [facility];  Service: Gynecology;  Laterality: N/A;   UPPER GASTROINTESTINAL ENDOSCOPY  03/04/2020    Family History  Problem Relation Age of Onset   Depression Mother    Migraines Mother    Irritable bowel syndrome Mother    Hypertension Mother    Migraines Father    Diabetes Father    Diabetes Maternal Grandmother     Hypertension Maternal Grandmother    Diabetes Sister    Stomach cancer Sister 69   Colon cancer Neg Hx    Esophageal cancer Neg Hx    Rectal cancer Neg Hx     Allergies  Allergen Reactions   Almond (Diagnostic) Other (See Comments)    [place]   Coconut Flavoring Agent (Non-Screening) Other (See Comments)    Mouth itches, but no impaired breathing (per patient   Septra  [Sulfamethoxazole -Trimethoprim ] Nausea Only and Other (See Comments)    Patient took a few courses of this and it ended up being stopped by a MD because it had started causing bad stomach pain (per the patient)    Current Outpatient Medications on File Prior to Visit  Medication Sig Dispense Refill   acetaminophen  (TYLENOL ) 500 MG tablet Take 1,000 mg by mouth every 6 (six) hours as needed.     EPINEPHrine  0.3 mg/0.3 mL IJ SOAJ injection Inject 0.3 mg into the muscle as needed for anaphylaxis. 1 each 0   Prenatal Vit-Fe Fumarate-FA (PRENATAL MULTIVITAMIN) TABS tablet Take 1 tablet by mouth daily at 12 noon.     clindamycin  (CLINDAGEL) 1 % gel Apply topically 2 (two) times daily. (Patient not taking: Reported on 06/02/2024) 30 g 0   SUMAtriptan  (IMITREX ) 50 MG tablet Take 1 tablet by mouth at migraine onset. May repeat in 2 hours if headache persists or recurs. (Patient not taking: Reported on 06/02/2024) 10 tablet 0   [DISCONTINUED] dicyclomine  (BENTYL ) 20 MG tablet Take 1 tablet (20 mg total) by mouth 2 (two) times daily. (Patient not taking: Reported on 12/25/2023) 20 tablet 0   [DISCONTINUED] famotidine  (PEPCID ) 20 MG tablet Take 1 tablet (20 mg total) by mouth 2 (two) times daily. 30 tablet 0   [DISCONTINUED] metoCLOPramide  (REGLAN ) 10 MG tablet Take 1 tablet (10 mg total) by mouth every 6 (six) hours as needed for nausea or vomiting. (Patient not taking: Reported on 09/19/2019) 30 tablet 0   [DISCONTINUED] pantoprazole  (PROTONIX ) 40 MG tablet Take 40 mg twice a day for 8 weeks 120 tablet 1   [DISCONTINUED] promethazine   (PHENERGAN ) 25 MG suppository Place 1 suppository (25 mg total) rectally every 6 (six) hours as needed for nausea or vomiting. (Patient not taking: Reported on 03/04/2020) 12 each 0   [DISCONTINUED] sucralfate  (CARAFATE ) 1 GM/10ML suspension Take 10 mLs (1 g total) by mouth 4 (four) times daily -  with meals and at bedtime. 420 mL 0   No current facility-administered medications on file prior to visit.    BP 126/68   Pulse (!) 112   Temp 98.4 F (36.9 C) (Temporal)   Ht 5' 3 (1.6 m)   Wt 126 lb (57.2 kg)   LMP 04/28/2024 (Exact Date)   SpO2 99%  BMI 22.32 kg/m  Objective:   Physical Exam Cardiovascular:     Rate and Rhythm: Normal rate and regular rhythm.  Pulmonary:     Effort: Pulmonary effort is normal.     Breath sounds: Normal breath sounds.  Musculoskeletal:     Cervical back: Neck supple.  Skin:    General: Skin is warm and dry.  Neurological:     Mental Status: She is alert and oriented to person, place, and time.  Psychiatric:        Mood and Affect: Mood normal.           Assessment & Plan:  Vaginal bleeding in pregnancy, first trimester Assessment & Plan: Labs obtained including hCG quantitative CBC. Transvaginal ultrasound ordered and completed.  Transvaginal ultrasound with gestation of 5 weeks 6 days as of 06/02/2024.  Orders: -     US  OB LESS THAN 14 WEEKS WITH OB TRANSVAGINAL; Future -     CBC -     hCG, quantitative, pregnancy  Pelvic cramping -     US  OB LESS THAN 14 WEEKS WITH OB TRANSVAGINAL; Future        Leveon Pelzer K Vyla Pint, NP

## 2024-06-02 NOTE — Patient Instructions (Signed)
 Stop by the lab prior to leaving today. I will notify you of your results once received.   You will receive a phone call today regarding the ultrasound.  It was a pleasure to see you today!

## 2024-06-04 ENCOUNTER — Telehealth: Payer: Self-pay

## 2024-06-04 DIAGNOSIS — O209 Hemorrhage in early pregnancy, unspecified: Secondary | ICD-10-CM

## 2024-06-04 NOTE — Telephone Encounter (Signed)
 Copied from CRM [id number]. Topic: Referral - Request for Referral >> Jun 04, 2024  1:32 PM Burnard DEL wrote: Did the patient discuss referral with their provider in the last year? Yes (If No - schedule appointment) (If Yes - send message)  Appointment offered? No  Type of order/referral and detailed reason for visit: OBGYN  Preference of office, provider, location: [facility] [place] [facility]  If referral order, have you been seen by this specialty before? No (If Yes, this issue or another issue? When? Where?  Can we respond through MyChart? Yes  **Patient was advised by PCP to schedule with OBGYN,however they are needing an referral**

## 2024-06-05 DIAGNOSIS — O209 Hemorrhage in early pregnancy, unspecified: Secondary | ICD-10-CM | POA: Insufficient documentation

## 2024-06-05 DIAGNOSIS — O4692 Antepartum hemorrhage, unspecified, second trimester: Secondary | ICD-10-CM | POA: Insufficient documentation

## 2024-06-05 NOTE — Telephone Encounter (Signed)
 Called and spoke with patient, she states the [facility] office will not see her in 2 weeks to repeat ultrasound based upon Kates recommendation. She is requesting to have Mallie order US  to be repeated in 2 weeks, does not have location preference. She plans on establishing with OB at that office still, but they will not see her until end of August.

## 2024-06-05 NOTE — Telephone Encounter (Signed)
 Noted. I placed the orders. She is not due for this until the week of July 28th so when they call to schedule have her schedule for that week.

## 2024-06-05 NOTE — Telephone Encounter (Signed)
 Called patient and reviewed all information. Patient verbalized understanding. Will call if any further questions.

## 2024-06-05 NOTE — Assessment & Plan Note (Signed)
 Labs obtained including hCG quantitative CBC. Transvaginal ultrasound ordered and completed.  Transvaginal ultrasound with gestation of 5 weeks 6 days as of 06/02/2024.

## 2024-06-05 NOTE — Telephone Encounter (Signed)
 Please call patient:  A referral was placed to this OB/GYN office on 05/28/2024.  As discussed during her visit, she has a letter on her MyChart portal dated 05/28/2024 with instructions on how to schedule an appointment.

## 2024-06-05 NOTE — Addendum Note (Signed)
 Addended by: Niaja Stickley K on: 06/05/2024 03:05 PM   Modules accepted: Orders

## 2024-06-07 ENCOUNTER — Inpatient Hospital Stay (HOSPITAL_COMMUNITY)
Admission: AD | Admit: 2024-06-07 | Discharge: 2024-06-07 | Disposition: A | Discharge status: Home / Self Care | Attending: Obstetrics & Gynecology | Admitting: Obstetrics & Gynecology

## 2024-06-07 ENCOUNTER — Inpatient Hospital Stay (HOSPITAL_COMMUNITY)

## 2024-06-07 ENCOUNTER — Encounter (HOSPITAL_COMMUNITY): Payer: Self-pay | Admitting: Obstetrics & Gynecology

## 2024-06-07 DIAGNOSIS — O26851 Spotting complicating pregnancy, first trimester: Secondary | ICD-10-CM | POA: Insufficient documentation

## 2024-06-07 DIAGNOSIS — K589 Irritable bowel syndrome without diarrhea: Secondary | ICD-10-CM | POA: Diagnosis not present

## 2024-06-07 DIAGNOSIS — O99611 Diseases of the digestive system complicating pregnancy, first trimester: Secondary | ICD-10-CM | POA: Diagnosis not present

## 2024-06-07 DIAGNOSIS — K900 Celiac disease: Secondary | ICD-10-CM | POA: Diagnosis not present

## 2024-06-07 DIAGNOSIS — O210 Mild hyperemesis gravidarum: Secondary | ICD-10-CM | POA: Diagnosis not present

## 2024-06-07 DIAGNOSIS — N939 Abnormal uterine and vaginal bleeding, unspecified: Secondary | ICD-10-CM

## 2024-06-07 DIAGNOSIS — R102 Pelvic and perineal pain: Secondary | ICD-10-CM | POA: Diagnosis not present

## 2024-06-07 DIAGNOSIS — Z3A01 Less than 8 weeks gestation of pregnancy: Secondary | ICD-10-CM | POA: Diagnosis not present

## 2024-06-07 DIAGNOSIS — O26891 Other specified pregnancy related conditions, first trimester: Secondary | ICD-10-CM | POA: Insufficient documentation

## 2024-06-07 DIAGNOSIS — R519 Headache, unspecified: Secondary | ICD-10-CM | POA: Insufficient documentation

## 2024-06-07 LAB — URINALYSIS, ROUTINE W REFLEX MICROSCOPIC
Bilirubin Urine: NEGATIVE
Glucose, UA: NEGATIVE mg/dL
Hgb urine dipstick: NEGATIVE
Ketones, ur: NEGATIVE mg/dL
Leukocytes,Ua: NEGATIVE
Nitrite: NEGATIVE
Protein, ur: NEGATIVE mg/dL
Specific Gravity, Urine: 1.019 (ref 1.005–1.030)
pH: 8 (ref 5.0–8.0)

## 2024-06-07 LAB — WET PREP, GENITAL
Clue Cells Wet Prep HPF POC: NONE SEEN
Sperm: NONE SEEN
Trich, Wet Prep: NONE SEEN
WBC, Wet Prep HPF POC: <10 (ref <10)
Yeast Wet Prep HPF POC: NONE SEEN

## 2024-06-07 MED ORDER — ONDANSETRON 4 MG PO TBDP: 4.0000 mg | ORAL_TABLET | Freq: Three times a day (TID) | ORAL | 0 refills | Status: DC | PRN

## 2024-06-07 MED ORDER — ONDANSETRON 4 MG PO TBDP
8.0000 mg | ORAL_TABLET | Freq: Once | ORAL | Status: AC
  Administered 2024-06-07: 8 mg via ORAL
  Filled 2024-06-07: qty 2

## 2024-06-07 NOTE — MAU Provider Note (Addendum)
 Event Date/Time   First Provider Initiated Contact with Patient 06/07/24 1225      S [name] is a 28 y.o. G1P0000 pregnant female at [redacted]w[redacted]d who presents to MAU today with complaint of N/V and continued spotting and pelvic cramping. Pt states had some vaginal spotting and cramping and thusly had US  at [facility] 7/15 that showed intrauterine GS w/o YS or FP or heartbeat.  Is scheduled to have viability US  10-14days.  However for last several days she has had intermittent N/V preventing poor PO intake.  Pt states also with HA.  She believes given her hx of dehydration given her IBS and Celiac disease that she may be getting dehydrated and thusly presented for evaluation.  She states initially the cramping was light but now more intense but still only intermittently and not lasting long time, w/o pattern.    Pertinent items noted in HPI and remainder of comprehensive ROS otherwise negative.   O BP 122/68 (BP Location: Left Arm)   Pulse 97   Temp 98.2 F (36.8 C) (Oral)   Resp 14   Wt 57.2 kg   LMP 04/28/2024 (Exact Date)   SpO2 99%   BMI 22.36 kg/m  Physical Exam Constitutional:      General: She is not in acute distress.    Appearance: She is well-developed and normal weight. She is not ill-appearing.  HENT:     Head: Normocephalic and atraumatic.     Mouth/Throat:     Mouth: Mucous membranes are moist.     Pharynx: Oropharynx is clear.  Eyes:     Extraocular Movements: Extraocular movements intact.  Cardiovascular:     Rate and Rhythm: Normal rate.  Pulmonary:     Effort: Pulmonary effort is normal. No respiratory distress.  Abdominal:     General: Abdomen is flat.     Palpations: Abdomen is soft.     Tenderness: There is no abdominal tenderness.  Skin:    General: Skin is warm and dry.  Neurological:     Mental Status: She is alert and oriented to person, place, and time.     Motor: No weakness.  Psychiatric:        Mood and Affect: Mood normal.        Behavior:  Behavior normal.      MDM: MAU Course:  Zofran  8mg  with significant improvement  Pt informed that the ultrasound is considered a limited OB ultrasound and is not intended to be a complete ultrasound exam.  Patient also informed that the ultrasound is not being completed with the intent of assessing for fetal or placental anomalies or any pelvic abnormalities.  Explained that the purpose of today's ultrasound is to assess for  viability.  Patient acknowledges the purpose of the exam and the limitations of the study.    My interpretation: Fetal cardiac activity at 107bpm, what appears to be small [facility]  Pt would like formal US  to be sure given increased severity of intermittent pelvic cramping. OB US  ordered.  US  showed SIUP measuring [redacted]w[redacted]d (will stay with prior US  for dating) with nrl interval progression since US  on 7/15 w/o [facility].   Pt reports feeling much better and stable for d/c with strict/usual return precautions.   AP #[redacted] weeks gestation #Hyperemesis of pregnancy - follow up with OBGYN as previously scheduled  - sent Zofran     Discharge from MAU in stable condition with strict/usual precautions Follow up at desired OBGYN as scheduled for ongoing prenatal  care  Allergies as of 06/07/2024       Reactions   Almond (diagnostic) Other (See Comments)   unkmnown   Coconut Flavoring Agent (non-screening) Other (See Comments)   Mouth itches, but no impaired breathing (per patient   Gluten Meal    Septra  [sulfamethoxazole -trimethoprim ] Nausea Only, Other (See Comments)   Patient took a few courses of this and it ended up being stopped by a MD because it had started causing bad stomach pain (per the patient)        Medication List     PAUSE taking these medications    SUMAtriptan  50 MG tablet Wait to take this until your doctor or other care provider tells you to start again. Commonly known as: Imitrex  Take 1 tablet by mouth at migraine onset. May repeat in 2 hours if  headache persists or recurs.       STOP taking these medications    clindamycin  1 % gel Commonly known as: Clindagel       TAKE these medications    acetaminophen  500 MG tablet Commonly known as: TYLENOL  Take 1,000 mg by mouth every 6 (six) hours as needed.   EPINEPHrine  0.3 mg/0.3 mL Soaj injection Commonly known as: EPI-PEN Inject 0.3 mg into the muscle as needed for anaphylaxis.   prenatal multivitamin Tabs tablet Take 1 tablet by mouth daily at 12 noon.        Jhonny Augustin BROCKS, MD 06/07/2024 3:00 PM

## 2024-06-07 NOTE — MAU Note (Signed)
..  Stephanie [name] is a 28 y.o. at [redacted]w[redacted]d here in MAU reporting: nausea and vomiting that started last week. She has been unable to keep anything down for the last 24 hrs. She now reports dizziness and feels drunk. Also reports since her appointment on 7/15 she has continued to have vaginal spotting and intermittent lower abdominal cramping. Was told to take Vit B6 but unable to keep down water, so she did not attempt the medication. Patient states she has had a headache that started this morning.   Pain score: head- 8   abdominal pain- 3 Vitals:   06/07/24 1200  BP: 119/60  Pulse: 90  Resp: 14  Temp: 98.2 F (36.8 C)  SpO2: 100%      Lab orders placed from triage: UA

## 2024-06-08 LAB — GC/CHLAMYDIA PROBE AMP (~~LOC~~) NOT AT ARMC
Chlamydia: NEGATIVE
Comment: NEGATIVE
Comment: NORMAL
Neisseria Gonorrhea: NEGATIVE

## 2024-06-11 ENCOUNTER — Encounter: Payer: Self-pay | Admitting: *Deleted

## 2024-06-19 ENCOUNTER — Ambulatory Visit: Payer: Self-pay | Admitting: *Deleted

## 2024-06-19 ENCOUNTER — Other Ambulatory Visit: Payer: Self-pay | Admitting: Primary Care

## 2024-06-19 DIAGNOSIS — O210 Mild hyperemesis gravidarum: Secondary | ICD-10-CM

## 2024-06-19 MED ORDER — ONDANSETRON 4 MG PO TBDP
4.0000 mg | ORAL_TABLET | Freq: Three times a day (TID) | ORAL | 0 refills | Status: DC | PRN
Start: 2024-06-19 — End: 2024-07-20

## 2024-06-19 NOTE — Telephone Encounter (Signed)
 Please call patient:  I will send a refill of Zofran  to her pharmacy.  Please have her use it sparingly.  She will need to follow-up with OB/GYN next week as scheduled for ongoing management of her nausea and vomiting.

## 2024-06-19 NOTE — Telephone Encounter (Signed)
 Copied from CRM [id number]. Topic: Clinical - Red Word Triage >> Jun 19, 2024 11:16 AM Robinson H wrote: [facility] that prompted transfer to Nurse Triage: Nausea and vomiting for 2 weeks patient is pregnant Reason for Disposition  Prescription anti-emetic treatment for Morning Sickness, questions about    Zofran  given during her ED visit on 06/07/2024 for nausea and vomiting daily.   Requesting a refill.   Pt. Said her provider knows she is pregnant.  Answer Assessment - Initial Assessment Questions 1. SEVERITY - NAUSEA: How bad is the nausea? (e.g., none; mild, moderate, severe)     I'm having nausea and vomiting daily.  I'm pregnant I think 8 weeks and 3 days.   I've been told different times.   I see my OB next week for the first time.  Zofran  was prescribed for me in the ED on 06/07/2024.    I was having nausea and vomiting all day every day when I went to the ED.    They gave me nausea medicine and sent me home with some.   I'm having to take it every morning when I wake up because if I don't I vomit all day.    No vomiting for the last 2 hours.   I just know at night and in the mornings the vomiting is worse.   I'm wanting a refill of the Zofran . 2. SEVERITY - VOMITING: Are you vomiting? If Yes, ask: How many times have you vomited in the past 24 hours? (e.g., nausea only; mild, moderate or severe vomiting)     Not in the last 2 hours but every morning and night it's worse.   If I don't take the Zofran  when I first get up in the mornings I have nausea and vomiting all day every day. 3. ONSET: When did the nausea or vomiting begin?      During my pregnancy 4. FLUIDS: What fluids or food have you vomited up today? Are you able to keep any liquids down?     When I went to the ED I could not keep anything down.   They did not give me IV fluids for some reason but gave me the anti nausea medicine.  They told me why they did not give me IV fluids that day but I don't remember what the reason  was. 5. TREATMENT: What have you been doing so far to treat this?      Taking the Zofran  every morning.   It's the only thing helping me not have nausea and vomiting all day. 6. DEHYDRATION: When was the last time you urinated? Are you feeling lightheaded? Any weight loss?     Not asked I am keeping fluids and all down with the Zofran  which is why I would like a refill. 7. PREGNANCY: How many weeks pregnant are you? How has the pregnancy been going?     Yes Approximately 8 weeks and 3 days.   I've been told different times.   I see my OB-GYN for the first time next week. 8. EDD: What date are you expecting to deliver?     Not sure yet.  Don't know exactly how far along I am. 9. MEDICINES: What medicines are you taking? (e.g., iron, opioid pain medicines, prenatal vitamins, vitamin B6)     Zofran  given to me in the ED.   I'm out and would like a refill. 10. OTHER SYMPTOMS: Do you have any other symptoms? (e.g., diarrhea, fever)  Just the nausea and vomiting daily without the Zofran .  Protocols used: Pregnancy - Morning Sickness (Nausea and Vomiting of Pregnancy)-A-AH  Pt seen in ED 06/07/2024 for nausea and vomiting daily related to morning sickness with pregnancy.   8 weeks and 3 days along approximately.   Was given Zofran .   Having to take it daily to prevent vomiting.   Requesting a refill.  Comer Gaskins, NP is aware she is pregnant per pt.   Last seen July 14 or 15, 2025.  Please send to [facility] in [place] if agreeable.      FYI Only or Action Required?: Action required by provider: medication refill request.  Patient was last seen in primary care on 06/02/2024 by Gaskins Comer POUR, NP.  Called Nurse Triage reporting Vomiting.morning sickness with pregnancy  Symptoms began several weeks ago.  Interventions attempted: Prescription medications: Zofran . Taking it every morning otherwise she is vomiting all day.    Has not vomited for the last 2 hours but it's  worse in the mornings and at night.   The Zofran  really helps.   Requesting a refill.     Symptoms are: unchanged. Without the Zofran  she is still having nausea and vomiting daily.   Sees her OB_GYN provider for the first time next week.  Triage Disposition: Call PCP Now  Patient/caregiver understands and will follow disposition?: Yes

## 2024-06-19 NOTE — Telephone Encounter (Signed)
 Called patient and reviewed all information. Patient verbalized understanding. Will call if any further questions.

## 2024-06-25 ENCOUNTER — Encounter: Payer: Self-pay | Admitting: *Deleted

## 2024-06-25 ENCOUNTER — Other Ambulatory Visit (INDEPENDENT_AMBULATORY_CARE_PROVIDER_SITE_OTHER): Payer: Self-pay

## 2024-06-25 ENCOUNTER — Ambulatory Visit: Admitting: *Deleted

## 2024-06-25 VITALS — BP 131/80 | HR 116 | Wt 132.0 lb

## 2024-06-25 DIAGNOSIS — Z3401 Encounter for supervision of normal first pregnancy, first trimester: Secondary | ICD-10-CM

## 2024-06-25 DIAGNOSIS — Z3A08 8 weeks gestation of pregnancy: Secondary | ICD-10-CM

## 2024-06-25 DIAGNOSIS — Z3400 Encounter for supervision of normal first pregnancy, unspecified trimester: Secondary | ICD-10-CM

## 2024-06-25 MED ORDER — PROMETHAZINE HCL 25 MG PO TABS: 25.0000 mg | ORAL_TABLET | Freq: Four times a day (QID) | ORAL | 2 refills | Status: DC | PRN

## 2024-06-25 MED ORDER — ONDANSETRON 4 MG PO TBDP: 4.0000 mg | ORAL_TABLET | Freq: Four times a day (QID) | ORAL | 3 refills | Status: DC | PRN

## 2024-06-25 NOTE — Progress Notes (Signed)
 New OB Intake  I explained I am completing New OB Intake today. We discussed EDD of 02/02/2025, by Last Menstrual Period. Pt is G1P0000. I reviewed her allergies, medications and Medical/Surgical/OB history.    Patient Active Problem List   Diagnosis Date Noted   Supervision of normal first pregnancy 06/25/2024   Vaginal bleeding in pregnancy, first trimester 06/05/2024   Nausea and vomiting in pregnancy 04/20/2019   Migraines 08/04/2014   Allergic rhinitis 04/03/2013   MDD (major depressive disorder), single episode, severe (HCC) 01/28/2013    Class: Chronic   Generalized anxiety disorder 01/28/2013    Concerns addressed today  Patient informed that the ultrasound is considered a limited obstetric ultrasound and is not intended to be a complete ultrasound exam.  Patient also informed that the ultrasound is not being completed with the intent of assessing for fetal or placental anomalies or any pelvic abnormalities. Explained that the purpose of today's ultrasound is to assess for viability.  Patient acknowledges the purpose of the exam and the limitations of the study.     Delivery Plans Plans to deliver at [facility]. Discussed the nature of our practice with multiple providers including residents and students. Due to the size of the practice, the delivering provider may not be the same as those providing prenatal care.   MyChart/Babyscripts MyChart access verified. I explained pt will have some visits in office and some virtually. Babyscripts app discussed and ordered.   Blood Pressure Cuff Blood pressure cuff discussed and given.Discussed to be used for virtual visits and or if needed BP checks weekly.  Anatomy US  Explained first scheduled US  will be around 19 weeks.   Last Pap Diagnosis  Date Value Ref Range Status  02/11/2018   Final   NEGATIVE FOR INTRAEPITHELIAL LESIONS OR MALIGNANCY.    First visit review I reviewed new OB appt with patient. Explained pt will be seen by  Claris, CNM at first visit. Discussed [place] genetic screening with patient will get drawn at [facility]. Routine prenatal labs to be collected at [facility].    Wanda Buckles, RN 06/25/2024  9:53 AM

## 2024-06-27 LAB — CULTURE, OB URINE

## 2024-06-27 LAB — URINE CULTURE, OB REFLEX

## 2024-07-01 ENCOUNTER — Ambulatory Visit: Payer: Self-pay | Admitting: *Deleted

## 2024-07-01 NOTE — Telephone Encounter (Signed)
 FYI Only or Action Required?: Action required by provider: request for appointment and requesting appt today due to called out of work and progressive pain and early pregancy.  Patient was last seen in primary care on 06/02/2024 by Stephanie Comer POUR, NP.  Called Nurse Triage reporting Hip Pain.  Symptoms began several days ago.  Interventions attempted: OTC medications: tylenol  500 mg not effective and Rest, hydration, or home remedies.  Symptoms are: gradually worsening.  Triage Disposition: See HCP Within 4 Hours (Or PCP Triage)  Patient/caregiver understands and will follow disposition?: Yes             Copied from CRM [id number]. Topic: Clinical - Red Word Triage >> Jul 01, 2024 11:18 AM Donna BRAVO wrote: Red Word that prompted transfer to Nurse Triage: patient right leg, Saturday pain started hurt to walk pain in hip area, Monday hip and knee pain problems walking. Now knee pain is constant, having  a hard time getting around. Reason for Disposition  MODERATE pain (e.g., interferes with normal activities, limping)  Answer Assessment - Initial Assessment Questions No available appt today with any provider. Scheduled appt tomorrow with PCP. Recommended if pain severe or worsens go to UC, [facility] or ED. Please advise if appt can be scheduled today . Patient called out of work today .        1. ONSET: When did the pain start?      Saturday in right leg and Monday in hip and knee and now middle back  2. LOCATION: Where is the pain located?      Right hip knee middle back  pain  3. PAIN: How bad is the pain?    (Scale 1-10; or mild, moderate, severe)     Worsening with walking limping ,now achey pain  4. WORK OR EXERCISE: Has there been any recent work or exercise that involved this part of the body?      Na  5. CAUSE: What do you think is causing the leg pain?     Not sure  6. OTHER SYMPTOMS: Do you have any other symptoms? (e.g., chest pain, back pain,  breathing difficulty, swelling, rash, fever, numbness, weakness)     Right leg pain ,knee, hip and middle back , right leg weakness with standing and causes to limp. Took tylenol  500 mg with no relief mild swelling to knee, hurts to extend knee and stand 7. PREGNANCY: How many weeks pregnant are you?      Due date in March   8. EDD: What date are you expecting to deliver?     02/02/2025  Protocols used: Pregnancy - Leg Pain-A-AH

## 2024-07-01 NOTE — Telephone Encounter (Signed)
 Noted, will evaluate.

## 2024-07-02 ENCOUNTER — Ambulatory Visit (INDEPENDENT_AMBULATORY_CARE_PROVIDER_SITE_OTHER): Admitting: Primary Care

## 2024-07-02 ENCOUNTER — Encounter: Payer: Self-pay | Admitting: Primary Care

## 2024-07-02 VITALS — BP 126/84 | HR 112 | Temp 99.1°F | Ht 63.0 in | Wt 132.0 lb

## 2024-07-02 DIAGNOSIS — M25561 Pain in right knee: Secondary | ICD-10-CM | POA: Diagnosis not present

## 2024-07-02 DIAGNOSIS — M25569 Pain in unspecified knee: Secondary | ICD-10-CM | POA: Insufficient documentation

## 2024-07-02 NOTE — Progress Notes (Signed)
 Subjective:    Patient ID: Stephanie [name], female    DOB: [birth year], 28 y.o.   MRN: [id number]  Knee Pain  Associated symptoms include numbness.    Stephanie [name] is a very pleasant 28 y.o. female who is [redacted] weeks gestation of pregnancy who presents today to discuss back and hip pain.  Recently she's been experiencing mid lower back pain. Five days ago she woke up with right hip pain. Two days later she began experiencing significant right knee pain and swelling. Since then her knee continues to remain painful, she believes she's noticed swelling in her foot with tingling. Her right calf is bothersome when applying weight.   She's unable to extend or flex her knee fully. She's limping on her right leg due to her pain. She's been taking Tylenol  without improvement.   She denies injury/trauma, color changes.    Review of Systems  Musculoskeletal:  Positive for arthralgias, back pain and joint swelling.  Skin:  Positive for color change.  Neurological:  Positive for numbness.         Past Medical History:  Diagnosis Date   Acute pancreatitis without infection or necrosis 04/21/2019   Anemia    Anxiety 01/28/2013   no meds   Axillary hidradenitis suppurativa 03/05/2019   Depression    no meds   Hidradenitis    IBS (irritable bowel syndrome)    Migraines    HA every other day - otc med prn   Recurrent streptococcal tonsillitis    Seasonal allergies    Seasonal environmental allergies   Sebaceous cyst 08/04/2018   L mid back    Vision abnormalities    Myopia; patient has history of devloping ulcers when she wore contacts.    Social History   Socioeconomic History   Marital status: Single    Spouse name: Not on file   Number of children: 0   Years of education: Not on file   Highest education level: Not on file  Occupational History   Occupation: Nanny  Tobacco Use   Smoking status: Never   Smokeless tobacco: Never  Vaping Use   Vaping status:  Never Used  Substance and Sexual Activity   Alcohol use: Never    Comment: socially   Drug use: Not Currently   Sexual activity: Yes    Partners: Male    Birth control/protection: None  Other Topics Concern   Not on file  Social History Narrative   Single.    Student at [employer].   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    Enjoys listening to music, hanging out with friends.    Social Drivers of Corporate investment banker Strain: Not on file  Food Insecurity: Not on file  Transportation Needs: Not on file  Physical Activity: Not on file  Stress: Not on file  Social Connections: Not on file  Intimate Partner Violence: Not on file    Past Surgical History:  Procedure Laterality Date   COLONOSCOPY     IBS   COLONOSCOPY     IBS   IRRIGATION AND DEBRIDEMENT ABSCESS Right 12/07/2021   Procedure: IRRIGATION AND DEBRIDEMENT AXILLARY ABSCESS;  Surgeon: Rubin Calamity, MD;  Location: [facility] OR;  Service: General;  Laterality: Right;   LAPAROSCOPY N/A 10/20/2018   Procedure: LAPAROSCOPY DIAGNOSTIC possible removal of endometriosis;  Surgeon: Dannielle Bouchard, DO;  Location: [facility] ORS;  Service: Gynecology;  Laterality: N/A;   UPPER GASTROINTESTINAL ENDOSCOPY  03/04/2020    Family  History  Problem Relation Age of Onset   Depression Mother    Migraines Mother    Irritable bowel syndrome Mother    Hypertension Mother    Migraines Father    Diabetes Father    Diabetes Maternal Grandmother    Hypertension Maternal Grandmother    Diabetes Sister    Stomach cancer Sister 7   Colon cancer Neg Hx    Esophageal cancer Neg Hx    Rectal cancer Neg Hx     Allergies  Allergen Reactions   Almond (Diagnostic) Other (See Comments)    unkmnown   Coconut Flavoring Agent (Non-Screening) Other (See Comments)    Mouth itches, but no impaired breathing (per patient   Gluten Meal    Septra  [Sulfamethoxazole -Trimethoprim ] Nausea Only and Other (See Comments)    Patient took a few courses  of this and it ended up being stopped by a MD because it had started causing bad stomach pain (per the patient)    Current Outpatient Medications on File Prior to Visit  Medication Sig Dispense Refill   acetaminophen  (TYLENOL ) 500 MG tablet Take 1,000 mg by mouth every 6 (six) hours as needed.     EPINEPHrine  0.3 mg/0.3 mL IJ SOAJ injection Inject 0.3 mg into the muscle as needed for anaphylaxis. 1 each 0   ondansetron  (ZOFRAN -ODT) 4 MG disintegrating tablet Take 1 tablet (4 mg total) by mouth every 8 (eight) hours as needed for nausea or vomiting. 20 tablet 0   Prenatal Vit-Fe Fumarate-FA (PRENATAL MULTIVITAMIN) TABS tablet Take 1 tablet by mouth daily at 12 noon.     promethazine  (PHENERGAN ) 25 MG tablet Take 1 tablet (25 mg total) by mouth every 6 (six) hours as needed for nausea or vomiting. 30 tablet 2   ondansetron  (ZOFRAN -ODT) 4 MG disintegrating tablet Take 1 tablet (4 mg total) by mouth every 6 (six) hours as needed for nausea. (Patient not taking: Reported on 07/02/2024) 20 tablet 3   [Paused] SUMAtriptan  (IMITREX ) 50 MG tablet Take 1 tablet by mouth at migraine onset. May repeat in 2 hours if headache persists or recurs. (Patient not taking: Reported on 07/02/2024) 10 tablet 0   [DISCONTINUED] metoCLOPramide  (REGLAN ) 10 MG tablet Take 1 tablet (10 mg total) by mouth every 6 (six) hours as needed for nausea or vomiting. (Patient not taking: Reported on 09/19/2019) 30 tablet 0   [DISCONTINUED] pantoprazole  (PROTONIX ) 40 MG tablet Take 40 mg twice a day for 8 weeks 120 tablet 1   [DISCONTINUED] sucralfate  (CARAFATE ) 1 GM/10ML suspension Take 10 mLs (1 g total) by mouth 4 (four) times daily -  with meals and at bedtime. 420 mL 0   No current facility-administered medications on file prior to visit.    BP 126/84   Pulse (!) 112   Temp 99.1 F (37.3 C) (Temporal)   Ht 5' 3 (1.6 m)   Wt 132 lb (59.9 kg)   LMP 04/28/2024 (Exact Date)   SpO2 98%   BMI 23.38 kg/m  Objective:    Physical Exam Constitutional:      General: She is not in acute distress. Pulmonary:     Effort: Pulmonary effort is normal.  Musculoskeletal:     Right knee: Swelling present. No erythema. Decreased range of motion. No tenderness.     Left knee: Normal range of motion.     Comments: Decrease in range of motion due to pain with flexion and extension of right knee.      Please set twice been  quiet     Assessment & Plan:  Acute pain of right knee Assessment & Plan: Exam today representative of effusion/bursitis.  Exam today does not present like a DVT. There was no trauma so highly unlikely to be a ligamental tear.  She is pregnant so we cannot perform an x-ray. Treatment is overall limited given pregnancy.  We discussed the use of a knee brace for support, diclofenac gel as needed, ice/heat, rest. Work note provided to allow her to rest.  Consider physical therapy, labs if no improvement .         Junious Ragone K Jaleeyah Munce, NP

## 2024-07-02 NOTE — Assessment & Plan Note (Signed)
 Exam today representative of effusion/bursitis.  Exam today does not present like a DVT. There was no trauma so highly unlikely to be a ligamental tear.  She is pregnant so we cannot perform an x-ray. Treatment is overall limited given pregnancy.  We discussed the use of a knee brace for support, diclofenac gel as needed, ice/heat, rest. Work note provided to allow her to rest.  Consider physical therapy, labs if no improvement .

## 2024-07-02 NOTE — Patient Instructions (Signed)
 Apply a knee sleeve to your right knee for support.  Elevate your knee when resting.  Continue ice/heat.  You can apply diclofenac (Voltaren) gel 3 times daily as needed. You can find this over the counter at any drug store, [employer], [employer], etc.  Continue Tylenol  as needed.  Schedule an appointment with Dr. Watt.   It was a pleasure to see you today!

## 2024-07-05 NOTE — Progress Notes (Signed)
 "    Nadiyah Zeis T. Anitra Doxtater, MD, CAQ Sports Medicine [facility] [address] [place], [address]  Phone: [phone]  FAX: [phone]  KENDLE TURBIN - 28 y.o. female  MRN [id number]  Date of Birth: Dec 25, 1995  Date: 07/08/2024  PCP: Gretta Comer POUR, NP  Referral: Gretta Comer POUR, NP  Chief Complaint  Patient presents with   Knee Pain    Right   Subjective:   DELAYNA SPARLIN is a 28 y.o. very pleasant female patient with Body mass index is 24.09 kg/m. who presents with the following:  Discussed the use of AI scribe software for clinical note transcription with the patient, who gave verbal consent to proceed.  Patient presents for evaluation of ongoing right sided knee pain.  She is currently seeing Dr. Gretta and saw her last week for knee pain.  At that point, she was [redacted] weeks pregnant, and conservative measures were recommended.  History of Present Illness Aiya S Shyteria Lewis is a 28 year old female who presents with right knee pain and swelling.  She woke up last Monday with difficulty putting pressure on her right leg, initially attributing the pain to her pregnancy. By Tuesday, her knee was swollen, and the pain had increased, leading to significant difficulty walking. She describes the sensation as if her knee might 'snap' or 'give out' under her.  She is unable to fully extend or bend her knee and finds walking on a flat foot particularly painful. To alleviate the pain, she has been walking on the ball of her foot, which is less painful than walking flat-footed. She has been using a crutch to assist with mobility and has tried to keep her knee elevated, using a pillow while sleeping.  She has a history of dislocating her right kneecap twice in high school but has not experienced any knee injuries or surgeries since then. No recent trauma or falls that could have caused the current symptoms. Her husband has  observed swelling in the knee, although she feels her mind might be playing tricks on her regarding the extent of the swelling.  She has been alternating between ice and heat for relief, noting that heat feels slightly better, but neither has provided significant improvement. She attempted to take Tylenol , but it did not alleviate the pain, so she discontinued its use.  During the review of symptoms, the pain is primarily located at the top of her knee and is exacerbated by walking. She also experiences an aching sensation when sitting. Her calf is slightly tender, which she attributes to her altered walking pattern. No history of rheumatological conditions such as lupus or rheumatoid arthritis.     Review of Systems is noted in the HPI, as appropriate  Objective:   BP 110/60   Pulse (!) 107   Temp 98.4 F (36.9 C) (Temporal)   Ht 5' 3 (1.6 m)   Wt 136 lb (61.7 kg)   LMP 04/28/2024 (Exact Date)   SpO2 99%   BMI 24.09 kg/m   GEN: No acute distress; alert,appropriate. PULM: Breathing comfortably in no respiratory distress PSYCH: Normally interactive.    Knee:  R Gait: Normal heel toe pattern, antalgic ROM: 0-120 Effusion: mild Echymosis or edema: none Patellar tendon NT Painful PLICA: neg Patellar grind: negative Medial and lateral patellar facet loading: negative medial and lateral joint lines:NT Mcmurray's pain Flexion-pinch pos Varus and valgus stress: stable Lachman: neg Ant and Post drawer: neg Hip abduction, IR,  ER: WNL Hip flexion str: 5/5 Hip abd: 5/5 Quad: 5/5 VMO atrophy:No Hamstring concentric and eccentric: 5/5   Laboratory and Imaging Data:  Results   Assessment and Plan:     ICD-10-CM   1. Acute pain of right knee  M25.561     2. [redacted] weeks gestation of pregnancy  Z3A.10       Assessment and Plan Assessment & Plan Right knee pain with effusion and synovitis Acute right knee pain with minimal effusion and synovitis, persisting for one  week. No recent trauma. Previous patellar dislocation history. Pain worsens with weight-bearing and movement. Differential includes synovitis, gout, or pseudogout. Limited treatment options due to pregnancy. - Advise rest and use of crutches to minimize weight-bearing. - Instruct to ice the knee 3-4 times daily for 20 minutes. - Encourage gentle range of motion exercises. - Advise against knee brace unless necessary for swelling control. - Discuss with OB regarding Tylenol  use, preference to avoid medication.  Pregnancy, first trimester First trimester of pregnancy, approximately ten weeks gestation. Limited treatment options for knee pain due to fetal development risks. - Discuss with OB regarding safe pain management options during pregnancy.     Medication Management during today's office visit: No orders of the defined types were placed in this encounter.  Medications Discontinued During This Encounter  Medication Reason   ondansetron  (ZOFRAN -ODT) 4 MG disintegrating tablet Duplicate   pantoprazole  (PROTONIX ) 40 MG tablet Completed Course    Orders placed today for conditions managed today: No orders of the defined types were placed in this encounter.   Disposition: No follow-ups on file.  [employer] One speech-to-text software was used for transcription in this dictation.  Possible transcriptional errors can occur using Animal nutritionist.   Signed,  Jacques DASEN. Briana Newman, MD   Outpatient Encounter Medications as of 07/08/2024  Medication Sig   acetaminophen  (TYLENOL ) 500 MG tablet Take 1,000 mg by mouth every 6 (six) hours as needed.   docusate sodium  (COLACE) 100 MG capsule Take 100 mg by mouth daily as needed for mild constipation.   EPINEPHrine  0.3 mg/0.3 mL IJ SOAJ injection Inject 0.3 mg into the muscle as needed for anaphylaxis.   ondansetron  (ZOFRAN -ODT) 4 MG disintegrating tablet Take 1 tablet (4 mg total) by mouth every 8 (eight) hours as needed for nausea or  vomiting.   Prenatal Vit-Fe Fumarate-FA (PRENATAL MULTIVITAMIN) TABS tablet Take 1 tablet by mouth daily at 12 noon.   promethazine  (PHENERGAN ) 25 MG tablet Take 1 tablet (25 mg total) by mouth every 6 (six) hours as needed for nausea or vomiting.   [Paused] SUMAtriptan  (IMITREX ) 50 MG tablet Take 1 tablet by mouth at migraine onset. May repeat in 2 hours if headache persists or recurs.   [DISCONTINUED] metoCLOPramide  (REGLAN ) 10 MG tablet Take 1 tablet (10 mg total) by mouth every 6 (six) hours as needed for nausea or vomiting. (Patient not taking: Reported on 09/19/2019)   [DISCONTINUED] ondansetron  (ZOFRAN -ODT) 4 MG disintegrating tablet Take 1 tablet (4 mg total) by mouth every 6 (six) hours as needed for nausea. (Patient not taking: Reported on 07/02/2024)   [DISCONTINUED] pantoprazole  (PROTONIX ) 40 MG tablet Take 40 mg twice a day for 8 weeks   [DISCONTINUED] sucralfate  (CARAFATE ) 1 GM/10ML suspension Take 10 mLs (1 g total) by mouth 4 (four) times daily -  with meals and at bedtime.   No facility-administered encounter medications on file as of 07/08/2024.   "

## 2024-07-08 ENCOUNTER — Ambulatory Visit (INDEPENDENT_AMBULATORY_CARE_PROVIDER_SITE_OTHER): Admitting: Certified Nurse Midwife

## 2024-07-08 ENCOUNTER — Ambulatory Visit (INDEPENDENT_AMBULATORY_CARE_PROVIDER_SITE_OTHER): Admitting: Family Medicine

## 2024-07-08 ENCOUNTER — Encounter: Payer: Self-pay | Admitting: Family Medicine

## 2024-07-08 VITALS — BP 110/60 | HR 107 | Temp 98.4°F | Ht 63.0 in | Wt 136.0 lb

## 2024-07-08 VITALS — BP 122/72 | HR 112 | Wt 137.0 lb

## 2024-07-08 DIAGNOSIS — Z3A10 10 weeks gestation of pregnancy: Secondary | ICD-10-CM | POA: Diagnosis not present

## 2024-07-08 DIAGNOSIS — Z3491 Encounter for supervision of normal pregnancy, unspecified, first trimester: Secondary | ICD-10-CM

## 2024-07-08 DIAGNOSIS — Z1331 Encounter for screening for depression: Secondary | ICD-10-CM | POA: Diagnosis not present

## 2024-07-08 DIAGNOSIS — M25561 Pain in right knee: Secondary | ICD-10-CM

## 2024-07-08 DIAGNOSIS — Z3401 Encounter for supervision of normal first pregnancy, first trimester: Secondary | ICD-10-CM

## 2024-07-08 DIAGNOSIS — Z3481 Encounter for supervision of other normal pregnancy, first trimester: Secondary | ICD-10-CM

## 2024-07-08 NOTE — Patient Instructions (Signed)

## 2024-07-08 NOTE — Progress Notes (Signed)
 INITIAL PRENATAL VISIT  History:  Stephanie [name] is a 28 y.o. G1P0000 at [redacted]w[redacted]d by LMP being seen today for her first obstetrical visit.  Her obstetrical history is significant for n/a . Patient does intend to breast feed. Pregnancy history fully reviewed.  Patient reports no complaints. She is currently having some problems with her knee and is being managed by sports medicine   HISTORY: OB History  Gravida Para Term Preterm AB Living  1 0 0 0 0 0  SAB IAB Ectopic Multiple Live Births  0 0 0 0 0    # Outcome Date GA Lbr Len/2nd Weight Sex Type Anes PTL Lv  1 Current             Obstetric Comments  Menstrual age: 78    Age 1st Pregnancy: N/A    Last pap smear was done 2019 and was normal  Past Medical History:  Diagnosis Date   Acute pancreatitis without infection or necrosis 04/21/2019   Anemia    Anxiety 01/28/2013   no meds   Axillary hidradenitis suppurativa 03/05/2019   Depression    no meds   Hidradenitis    IBS (irritable bowel syndrome)    Migraines    HA every other day - otc med prn   Recurrent streptococcal tonsillitis    Seasonal allergies    Seasonal environmental allergies   Sebaceous cyst 08/04/2018   L mid back    Vision abnormalities    Myopia; patient has history of devloping ulcers when she wore contacts.   Past Surgical History:  Procedure Laterality Date   COLONOSCOPY     IBS   COLONOSCOPY     IBS   IRRIGATION AND DEBRIDEMENT ABSCESS Right 12/07/2021   Procedure: IRRIGATION AND DEBRIDEMENT AXILLARY ABSCESS;  Surgeon: Rubin Calamity, MD;  Location: [facility] OR;  Service: General;  Laterality: Right;   LAPAROSCOPY N/A 10/20/2018   Procedure: LAPAROSCOPY DIAGNOSTIC possible removal of endometriosis;  Surgeon: Dannielle Bouchard, DO;  Location: [facility] ORS;  Service: Gynecology;  Laterality: N/A;   UPPER GASTROINTESTINAL ENDOSCOPY  03/04/2020   Family History  Problem Relation Age of Onset   Depression Mother    Migraines Mother    Irritable  bowel syndrome Mother    Hypertension Mother    Migraines Father    Diabetes Father    Diabetes Maternal Grandmother    Hypertension Maternal Grandmother    Diabetes Sister    Stomach cancer Sister 34   Colon cancer Neg Hx    Esophageal cancer Neg Hx    Rectal cancer Neg Hx    Social History   Tobacco Use   Smoking status: Never   Smokeless tobacco: Never  Vaping Use   Vaping status: Never Used  Substance Use Topics   Alcohol use: Never    Comment: socially   Drug use: Not Currently   Allergies  Allergen Reactions   Almond (Diagnostic) Other (See Comments)    [place]mnown   Coconut Flavoring Agent (Non-Screening) Other (See Comments)    Mouth itches, but no impaired breathing (per patient   Gluten Meal    Septra  [Sulfamethoxazole -Trimethoprim ] Nausea Only and Other (See Comments)    Patient took a few courses of this and it ended up being stopped by a MD because it had started causing bad stomach pain (per the patient)   Current Outpatient Medications on File Prior to Visit  Medication Sig Dispense Refill   promethazine  (PHENERGAN ) 25 MG tablet Take 1 tablet (25  mg total) by mouth every 6 (six) hours as needed for nausea or vomiting. 30 tablet 2   acetaminophen  (TYLENOL ) 500 MG tablet Take 1,000 mg by mouth every 6 (six) hours as needed.     EPINEPHrine  0.3 mg/0.3 mL IJ SOAJ injection Inject 0.3 mg into the muscle as needed for anaphylaxis. 1 each 0   ondansetron  (ZOFRAN -ODT) 4 MG disintegrating tablet Take 1 tablet (4 mg total) by mouth every 8 (eight) hours as needed for nausea or vomiting. 20 tablet 0   Prenatal Vit-Fe Fumarate-FA (PRENATAL MULTIVITAMIN) TABS tablet Take 1 tablet by mouth daily at 12 noon.     [Paused] SUMAtriptan  (IMITREX ) 50 MG tablet Take 1 tablet by mouth at migraine onset. May repeat in 2 hours if headache persists or recurs. 10 tablet 0   [DISCONTINUED] metoCLOPramide  (REGLAN ) 10 MG tablet Take 1 tablet (10 mg total) by mouth every 6 (six) hours as  needed for nausea or vomiting. (Patient not taking: Reported on 09/19/2019) 30 tablet 0   [DISCONTINUED] sucralfate  (CARAFATE ) 1 GM/10ML suspension Take 10 mLs (1 g total) by mouth 4 (four) times daily -  with meals and at bedtime. 420 mL 0   No current facility-administered medications on file prior to visit.    Review of Systems Pertinent items noted in HPI and remainder of comprehensive ROS otherwise negative.  Indications for ASA therapy (per UpToDate) One of the following: (Needs 162 mg daily) Previous pregnancy with preeclampsia, especially early onset and with an adverse outcome No Chronic hypertension No Type 1 or 2 diabetes mellitus No Multifetal gestation No Chronic kidney disease No Autoimmune disease (antiphospholipid syndrome, systemic lupus erythematosus) No Two or more of the following: (Can do 81 mg daily) Nulliparity Yes Obesity (body mass index >30 kg/m2) No Family history of preeclampsia in mother or sister Yes Age >=35 years No Sociodemographic characteristics (African American race, low socioeconomic level) Yes Personal risk factors (eg, previous pregnancy with low birth weight or small for gestational age infant, previous adverse pregnancy outcome [eg, stillbirth], interval >10 years between pregnancies) No In vitro conception No  Physical Exam:   Vitals:   07/08/24 1512  BP: 122/72  Pulse: (!) 112  Weight: 137 lb (62.1 kg)   Fetal Heart Rate (bpm): 172    General: well-developed, well-nourished female in no acute distress  Breasts:  normal appearance, no masses or tenderness bilaterally, exam done in the presence of a chaperone.   Skin: normal coloration and turgor, no rashes  Neurologic: oriented, normal, negative, normal mood  Extremities: normal strength, tone, and muscle mass, ROM of all joints is normal  HEENT PERRLA, extraocular movement intact and sclera clear, anicteric  Neck supple and no masses  Cardiovascular: regular rate and rhythm   Respiratory:  no respiratory distress, normal breath sounds  Abdomen: soft, non-tender; bowel sounds normal; no masses,  no organomegaly  Pelvic: Pelvic deferred per patient request for being unable to bend her knee well.    Assessment:  Pregnancy: G1P0000 Patient Active Problem List   Diagnosis Date Noted   Acute knee pain 07/02/2024   Supervision of normal first pregnancy 06/25/2024   Vaginal bleeding in pregnancy, first trimester 06/05/2024   Migraines 08/04/2014   Allergic rhinitis 04/03/2013   MDD (major depressive disorder), single episode, severe (HCC) 01/28/2013    Class: Chronic   Generalized anxiety disorder 01/28/2013    Plan:  1. Encounter for supervision of low-risk pregnancy in first trimester (Primary) - Patient doing well.  - Has  not started feeling movement yet.   2. [redacted] weeks gestation of pregnancy - reviewed 1st trimester expectation and discussed fetal movement expectations for up to 20 weeks of pregnancy,  - Recommended the use of baby Asprin daily to decrease risk for PreE in pregnancy starting 12-14 weeks.   3. Acute pain of right knee - Stable. Discussed safe medications and methods for pain and discomfort that are safe in pregnancy.  - Discussed risks and benefits of imaging during pregnancy and if patient needs further testing to let us  know.    Initial labs drawn. Continue prenatal vitamins. Problem list reviewed and updated. Genetic Screening discussed, Panorama and Horizon: ordered. Ultrasound discussed; fetal anatomic survey: scheduled. Anticipatory guidance about prenatal visits given including labs, ultrasounds, and testing. Weight gain recommendations per IOM guidelines reviewed: underweight/BMI 18.5 or less > 28 - 40 lbs; normal weight/BMI 18.5 - 24.9 > 25 - 35 lbs; overweight/BMI 25 - 29.9 > 15 - 25 lbs; obese/BMI 30 or more > 11 - 20 lbs. Discussed usage of the Babyscripts app for more information about pregnancy, and to track blood  pressures. Also discussed usage of virtual visits as additional source of managing and completing prenatal visits.  Patient was encouraged to use MyChart to review results, send requests, and have questions addressed.   The nature of [facility] with multiple MDs and Advanced Practice Providers was explained to patient; also emphasized that residents, students are part of our team. Routine obstetric precautions reviewed. Encouraged to seek out care at our office or emergency room [facility] preferred) for urgent and/or emergent concerns. Return in about 4 weeks (around 08/05/2024) for LOB with CNM or MD .    Delilah Erven) Emilio, MSN, CNM  [facility]  07/08/2024 3:45 PM

## 2024-07-08 NOTE — Progress Notes (Signed)
 New OB   Pap today declines today due to knee condition today. Genetic Screening: Yes  Anatomy already scheduled 09/08/24.  CC: pt states she may have had spotting this morning had pinkish discharge on tissue. Yet when wiping again today there was no blood.

## 2024-07-10 LAB — CBC/D/PLT+RPR+RH+ABO+RUBIGG...
Antibody Screen: NEGATIVE
Basophils Absolute: 0 x10E3/uL (ref 0.0–0.2)
Basos: 0 %
EOS (ABSOLUTE): 0.1 x10E3/uL (ref 0.0–0.4)
Eos: 1 %
HCV Ab: NONREACTIVE
HIV Screen 4th Generation wRfx: NONREACTIVE
Hematocrit: 39.6 % (ref 34.0–46.6)
Hemoglobin: 13 g/dL (ref 11.1–15.9)
Hepatitis B Surface Ag: NEGATIVE
Immature Grans (Abs): 0 x10E3/uL (ref 0.0–0.1)
Immature Granulocytes: 0 %
Lymphocytes Absolute: 1.7 x10E3/uL (ref 0.7–3.1)
Lymphs: 25 %
MCH: 30.6 pg (ref 26.6–33.0)
MCHC: 32.8 g/dL (ref 31.5–35.7)
MCV: 93 fL (ref 79–97)
Monocytes Absolute: 0.5 x10E3/uL (ref 0.1–0.9)
Monocytes: 7 %
Neutrophils Absolute: 4.4 x10E3/uL (ref 1.4–7.0)
Neutrophils: 67 %
Platelets: 303 x10E3/uL (ref 150–450)
RBC: 4.25 x10E6/uL (ref 3.77–5.28)
RDW: 13.7 % (ref 11.7–15.4)
RPR Ser Ql: NONREACTIVE
Rh Factor: POSITIVE
Rubella Antibodies, IGG: 1.13 {index} (ref >0.99)
WBC: 6.7 x10E3/uL (ref 3.4–10.8)

## 2024-07-10 LAB — HEMOGLOBIN A1C
Est. average glucose Bld gHb Est-mCnc: 114 mg/dL
Hgb A1c MFr Bld: 5.6 % (ref 4.8–5.6)

## 2024-07-10 LAB — TSH RFX ON ABNORMAL TO FREE T4: TSH: 2.37 u[IU]/mL (ref 0.450–4.500)

## 2024-07-10 LAB — HCV INTERPRETATION

## 2024-07-13 ENCOUNTER — Ambulatory Visit: Payer: Self-pay | Admitting: Certified Nurse Midwife

## 2024-07-14 LAB — PANORAMA PRENATAL TEST FULL PANEL:PANORAMA TEST PLUS 5 ADDITIONAL MICRODELETIONS: FETAL FRACTION: 12.5

## 2024-07-19 LAB — HORIZON CUSTOM: REPORT SUMMARY: NEGATIVE

## 2024-07-20 ENCOUNTER — Inpatient Hospital Stay (HOSPITAL_COMMUNITY)
Admission: AD | Admit: 2024-07-20 | Discharge: 2024-07-20 | Disposition: A | Discharge status: Home / Self Care | Attending: Obstetrics & Gynecology | Admitting: Obstetrics & Gynecology

## 2024-07-20 ENCOUNTER — Inpatient Hospital Stay (HOSPITAL_COMMUNITY)

## 2024-07-20 ENCOUNTER — Encounter (HOSPITAL_COMMUNITY): Payer: Self-pay | Admitting: Obstetrics & Gynecology

## 2024-07-20 DIAGNOSIS — M7989 Other specified soft tissue disorders: Secondary | ICD-10-CM | POA: Insufficient documentation

## 2024-07-20 DIAGNOSIS — O26891 Other specified pregnancy related conditions, first trimester: Secondary | ICD-10-CM

## 2024-07-20 DIAGNOSIS — M25561 Pain in right knee: Secondary | ICD-10-CM | POA: Diagnosis not present

## 2024-07-20 DIAGNOSIS — Z3A11 11 weeks gestation of pregnancy: Secondary | ICD-10-CM

## 2024-07-20 DIAGNOSIS — O210 Mild hyperemesis gravidarum: Secondary | ICD-10-CM | POA: Diagnosis not present

## 2024-07-20 DIAGNOSIS — O26851 Spotting complicating pregnancy, first trimester: Secondary | ICD-10-CM | POA: Diagnosis not present

## 2024-07-20 DIAGNOSIS — O4692 Antepartum hemorrhage, unspecified, second trimester: Secondary | ICD-10-CM

## 2024-07-20 DIAGNOSIS — M25469 Effusion, unspecified knee: Secondary | ICD-10-CM | POA: Insufficient documentation

## 2024-07-20 HISTORY — DX: Celiac disease: K90.0

## 2024-07-20 HISTORY — DX: Endometriosis, unspecified: N80.9

## 2024-07-20 LAB — URINALYSIS, ROUTINE W REFLEX MICROSCOPIC
Bacteria, UA: NONE SEEN
Bilirubin Urine: NEGATIVE
Glucose, UA: 50 mg/dL — AB
Ketones, ur: NEGATIVE mg/dL
Leukocytes,Ua: NEGATIVE
Nitrite: NEGATIVE
Protein, ur: 30 mg/dL — AB
Specific Gravity, Urine: 1.028 (ref 1.005–1.030)
pH: 6 (ref 5.0–8.0)

## 2024-07-20 LAB — WET PREP, GENITAL
Clue Cells Wet Prep HPF POC: NONE SEEN
Sperm: NONE SEEN
Trich, Wet Prep: NONE SEEN
WBC, Wet Prep HPF POC: <10 (ref <10)
Yeast Wet Prep HPF POC: NONE SEEN

## 2024-07-20 MED ORDER — ONDANSETRON 8 MG PO TBDP: 8.0000 mg | ORAL_TABLET | Freq: Three times a day (TID) | ORAL | 1 refills | Status: DC | PRN

## 2024-07-20 MED ORDER — ONDANSETRON 8 MG PO TBDP: 4.0000 mg | ORAL_TABLET | Freq: Three times a day (TID) | ORAL | 1 refills | Status: DC | PRN

## 2024-07-20 NOTE — MAU Note (Addendum)
 Stephanie [name] is a 28 y.o. at [redacted]w[redacted]d here in MAU reporting: yesterday felt tired and nauseated. Stayed in bed most of the day felt hot and took temp it was 100.4. vomited and had some diarrhea,,. (Ran out of zofran  so could not take anything or nausea. ) today still feeling nauseated and having some cramping. Went to BR and when she wiped saw some dark red stringy discharge.  Also stated her right knee has been swollen and red for abut 3 weeks. Went to a provider and they refused to do an x-ray because she was pregnant and really did not do anything else. It is effecting her at work because it is hard to get around  LMP:  Onset of complaint: yesterday Pain score: 4 Vitals:   07/20/24 1320  BP: 130/64  Pulse: (!) 118  Resp: 18  Temp: 98.4 F (36.9 C)     FHT: 166  Lab orders placed from triage: u/a, wet prep, gc.

## 2024-07-20 NOTE — Progress Notes (Signed)
 VASCULAR LAB    Right lower extremity venous duplex has been performed.  See CV proc for preliminary results.  Gave verbal report to Delon Emms, NP  RACHEL PELLET, RVT 07/20/2024, 6:17 PM

## 2024-07-20 NOTE — MAU Provider Note (Signed)
 History     CSN: [id number]  Arrival date and time: 07/20/24 1242   Event Date/Time   First Provider Initiated Contact with Patient 07/20/24 1408      Chief Complaint  Patient presents with   Nausea   Emesis   Abdominal Pain   Vaginal Bleeding   HPI  Stephanie [name]  Is a  28 y.o. female G1P0000 @ [redacted]w[redacted]d here with vaginal bleeding and N/v. She reports feeling really bad yesterday with a  fever of 100.4 but no fever today. She has run out of Zofran . She went to the bathroom today and noticed stringy blood discharge.   She is also complaining of right knee pain that has been present for more than 3 weeks. She has tried rest, ice and elevation. She saw sports medicine who would not do any imaging because she is pregnant.   OB History     Gravida  1   Para  0   Term  0   Preterm  0   AB  0   Living  0      SAB  0   IAB  0   Ectopic  0   Multiple  0   Live Births  0        Obstetric Comments  Menstrual age: 53  Age 1st Pregnancy: N/A          Past Medical History:  Diagnosis Date   Acute pancreatitis without infection or necrosis 04/21/2019   Anemia    Anxiety 01/28/2013   no meds   Axillary hidradenitis suppurativa 03/05/2019   Celiac disease    Depression    no meds   Endometriosis    Hidradenitis    IBS (irritable bowel syndrome)    Migraines    HA every other day - otc med prn   Recurrent streptococcal tonsillitis    Seasonal allergies    Seasonal environmental allergies   Sebaceous cyst 08/04/2018   L mid back    Vision abnormalities    Myopia; patient has history of devloping ulcers when she wore contacts.    Past Surgical History:  Procedure Laterality Date   COLONOSCOPY     IBS   COLONOSCOPY     IBS   IRRIGATION AND DEBRIDEMENT ABSCESS Right 12/07/2021   Procedure: IRRIGATION AND DEBRIDEMENT AXILLARY ABSCESS;  Surgeon: Rubin Calamity, MD;  Location: [facility] OR;  Service: General;  Laterality: Right;   LAPAROSCOPY N/A  10/20/2018   Procedure: LAPAROSCOPY DIAGNOSTIC possible removal of endometriosis;  Surgeon: Dannielle Bouchard, DO;  Location: [facility] ORS;  Service: Gynecology;  Laterality: N/A;   UPPER GASTROINTESTINAL ENDOSCOPY  03/04/2020    Family History  Problem Relation Age of Onset   Depression Mother    Migraines Mother    Irritable bowel syndrome Mother    Hypertension Mother    Migraines Father    Diabetes Father    Diabetes Maternal Grandmother    Hypertension Maternal Grandmother    Diabetes Sister    Stomach cancer Sister 54   Colon cancer Neg Hx    Esophageal cancer Neg Hx    Rectal cancer Neg Hx     Social History   Tobacco Use   Smoking status: Never   Smokeless tobacco: Never  Vaping Use   Vaping status: Never Used  Substance Use Topics   Alcohol use: Never    Comment: socially   Drug use: Not Currently    Allergies:  Allergies  Allergen Reactions  Almond (Diagnostic) Other (See Comments)    unkmnown   Coconut Flavoring Agent (Non-Screening) Other (See Comments)    Mouth itches, but no impaired breathing (per patient   Gluten Meal    Septra  [Sulfamethoxazole -Trimethoprim ] Nausea Only and Other (See Comments)    Patient took a few courses of this and it ended up being stopped by a MD because it had started causing bad stomach pain (per the patient)    Medications Prior to Admission  Medication Sig Dispense Refill Last Dose/Taking   acetaminophen  (TYLENOL ) 500 MG tablet Take 1,000 mg by mouth every 6 (six) hours as needed.   Past Week   docusate sodium  (COLACE) 100 MG capsule Take 100 mg by mouth daily as needed for mild constipation.   Past Week   ondansetron  (ZOFRAN -ODT) 4 MG disintegrating tablet Take 1 tablet (4 mg total) by mouth every 8 (eight) hours as needed for nausea or vomiting. 20 tablet 0 07/19/2024 Evening   Prenatal Vit-Fe Fumarate-FA (PRENATAL MULTIVITAMIN) TABS tablet Take 1 tablet by mouth daily at 12 noon.   Past Week   promethazine  (PHENERGAN ) 25 MG  tablet Take 1 tablet (25 mg total) by mouth every 6 (six) hours as needed for nausea or vomiting. 30 tablet 2 07/19/2024 Noon   EPINEPHrine  0.3 mg/0.3 mL IJ SOAJ injection Inject 0.3 mg into the muscle as needed for anaphylaxis. 1 each 0    [Paused] SUMAtriptan  (IMITREX ) 50 MG tablet Take 1 tablet by mouth at migraine onset. May repeat in 2 hours if headache persists or recurs. 10 tablet 0     Review of Systems Physical Exam   Blood pressure 130/64, pulse (!) 118, temperature 98.4 F (36.9 C), resp. rate 18, height 5' 3 (1.6 m), weight 62.1 kg, last menstrual period 04/28/2024.  Physical Exam  MAU Course  Procedures  MDM ***  Assessment and Plan  ***  Stephanie [name] 07/20/2024, 2:21 PM

## 2024-07-21 ENCOUNTER — Telehealth: Payer: Self-pay

## 2024-07-21 LAB — GC/CHLAMYDIA PROBE AMP (~~LOC~~) NOT AT ARMC
Chlamydia: NEGATIVE
Comment: NEGATIVE
Comment: NORMAL
Neisseria Gonorrhea: NEGATIVE

## 2024-07-21 NOTE — Telephone Encounter (Signed)
Noted and agree with dispo. ?

## 2024-07-21 NOTE — Telephone Encounter (Signed)
 Per chart review pt admitted to [facility]. Sending to Dr Avelina since MARLA Gaskins NP is out of office.

## 2024-07-21 NOTE — Telephone Encounter (Signed)
 Stephanie [name]

## 2024-08-13 ENCOUNTER — Ambulatory Visit: Admitting: Obstetrics & Gynecology

## 2024-08-13 ENCOUNTER — Encounter: Payer: Self-pay | Admitting: Obstetrics & Gynecology

## 2024-08-13 VITALS — BP 117/71 | HR 121 | Wt 145.0 lb

## 2024-08-13 DIAGNOSIS — Z23 Encounter for immunization: Secondary | ICD-10-CM

## 2024-08-13 DIAGNOSIS — Z3A15 15 weeks gestation of pregnancy: Secondary | ICD-10-CM

## 2024-08-13 DIAGNOSIS — Z3402 Encounter for supervision of normal first pregnancy, second trimester: Secondary | ICD-10-CM | POA: Diagnosis not present

## 2024-08-13 MED ORDER — ASPIRIN 81 MG PO TBEC: 81.0000 mg | DELAYED_RELEASE_TABLET | Freq: Every day | ORAL | 2 refills | Status: AC

## 2024-08-13 NOTE — Patient Instructions (Signed)
  Safe Medications in Pregnancy  that are over the counter  Acne:  Benzoyl Peroxide  Salicylic Acid  *NO-Retin A  Backache/Headache:  Tylenol: 2 Regular strength every 4 hours OR               2 Extra strength every 6 hours   Colds/Coughs/Allergies: Allegra Benadryl (alcohol free) 25 mg every 6 hours as needed  Breath right strips  Claritin  Cepacol throat lozenges  Chloraseptic throat spray  Cold-Eeze- up to three times per day  Cough drops, alcohol free  Flonase Guaifenesin  Mucinex (plain/DM) Nasacort Robitussin DM (plain only, alcohol free)  Saline nasal spray/drops Steroid nasal sprays Sudafed (Pseudoephedrine) & Actifed * use only after [redacted] weeks gestation and if you do not have high blood pressure  Tylenol  Vicks Vaporub  Zicam Zinc lozenges  Zyrtec   Make sure to not take anything that has the active ingredient Phenylephrine   Constipation:  Colace  Ducolax suppositories  Fleet enema  Glycerin suppositories  Metamucil  Milk of magnesia  Miralax  Senokot  Smooth move tea   Diarrhea:  Kaopectate  Imodium A-D   *NO Pepto Bismol   Hemorrhoids:  Anusol  Anusol-HC  Preparation-H  Tucks   Indigestion:  Tums  Maalox  Mylanta  Nexium Pepcid  Zantac Prevacid Protonix Prilosec  Insomnia:  Benadryl 25 - 50 mg every 6 hours as needed  Tylenol PM  Unisom  Leg Cramps:  Tums  Magnesium tablets  Nausea/Vomiting:  Bonine  Dramamine  Emetrol  Ginger extract  Sea bands  Meclizine   Nausea medication to take during pregnancy:  Unisom (doxylamine succinate 25 mg tablets) Take one tablet daily at bedtime. If symptoms are not adequately controlled, the dose can be increased to a maximum recommended dose of two tablets daily (1/2 tablet in the morning, 1/2 tablet mid-afternoon and one at bedtime).  Vitamin B6 100mg tablets. Take one tablet twice a day (up to 200 mg per day).   Skin Rashes:  Aveeno products  Benadryl cream or Benadryl  tablets 25mg every 6 hours as needed  Calamine Lotion  1% Hydrocortisone cream   Yeast infection:  Gyne-lotrimin 7  Monistat 3 or 7day   **If taking multiple medications, please check labels to avoid duplicating the same active ingredients  **Take medication as directed on the label ** ** Do not exceed 4000 mg of Tylenol/Acetaminophen in 24 hours**  **Do not take medications that contain Aspirin or Ibuprofen** Unless your doctor has prescribed low dose Aspirin for prevention of Pre-eclampsia       

## 2024-08-13 NOTE — Progress Notes (Signed)
   PRENATAL VISIT NOTE  Subjective:  Stephanie [name] is a 28 y.o. G1P0000 at [redacted]w[redacted]d being seen today for ongoing prenatal care.  She is currently monitored for the following issues for this low-risk pregnancy and has MDD (major depressive disorder), single episode, severe (HCC); Generalized anxiety disorder; Allergic rhinitis; Migraines; Vaginal bleeding in pregnancy, first trimester; Supervision of normal first pregnancy; and Acute knee pain on their problem list.  Patient reports no complaints.  Contractions: Irritability. Vag. Bleeding: None.  Movement: Absent. Denies leaking of fluid.   The following portions of the patient's history were reviewed and updated as appropriate: allergies, current medications, past family history, past medical history, past social history, past surgical history and problem list.   Objective:    Vitals:   08/13/24 1120  BP: 117/71  Pulse: (!) 121  Weight: 145 lb (65.8 kg)    Fetal Status:  Fetal Heart Rate (bpm): 164   Movement: Absent    General: Alert, oriented and cooperative. Patient is in no acute distress.  Skin: Skin is warm and dry. No rash noted.   Cardiovascular: Normal heart rate noted  Respiratory: Normal respiratory effort, no problems with respiration noted  Abdomen: Soft, gravid, appropriate for gestational age.  Pain/Pressure: Present     Pelvic: Cervical exam deferred        Extremities: Normal range of motion.     Mental Status: Normal mood and affect. Normal behavior. Normal judgment and thought content.   Assessment and Plan:  Pregnancy: G1P0000 at [redacted]w[redacted]d 1. Need for influenza vaccination - Flu vaccine trivalent PF, 6mos and older(Flulaval,Afluria,Fluarix,Fluzone) given  2. [redacted] weeks gestation of pregnancy 3. Encounter for supervision of normal first pregnancy in second trimester (Primary) Aspirin  prescribed for preeclampsia prophylaxis.  Considering AFP, information given.   - aspirin  EC 81 MG tablet; Take 1 tablet (81 mg  total) by mouth at bedtime. Start taking when you are [redacted] weeks pregnant for rest of pregnancy for prevention of preeclampsia  Dispense: 300 tablet; Refill: 2 No other complaints or concerns.  Routine obstetric precautions reviewed.  Please refer to After Visit Summary for other counseling recommendations.   Return in about 4 weeks (around 09/10/2024) for OFFICE OB VISIT (MD or APP).  Future Appointments  Date Time Provider Department Center  09/08/2024  8:00 AM [facility] PROVIDER 1 [facility]  09/08/2024  8:30 AM [facility] US1 [facility]  09/09/2024 11:15 AM Letha Renshaw, CNM [facility]    Gloris Hugger, MD\

## 2024-08-14 ENCOUNTER — Encounter: Payer: Self-pay | Admitting: Obstetrics & Gynecology

## 2024-09-08 ENCOUNTER — Other Ambulatory Visit: Payer: Self-pay | Admitting: *Deleted

## 2024-09-08 ENCOUNTER — Ambulatory Visit

## 2024-09-08 ENCOUNTER — Encounter: Payer: Self-pay | Admitting: Maternal & Fetal Medicine

## 2024-09-08 ENCOUNTER — Ambulatory Visit: Attending: Obstetrics and Gynecology | Admitting: Maternal & Fetal Medicine

## 2024-09-08 VITALS — BP 122/67

## 2024-09-08 DIAGNOSIS — O418X20 Other specified disorders of amniotic fluid and membranes, second trimester, not applicable or unspecified: Secondary | ICD-10-CM | POA: Insufficient documentation

## 2024-09-08 DIAGNOSIS — Z363 Encounter for antenatal screening for malformations: Secondary | ICD-10-CM | POA: Insufficient documentation

## 2024-09-08 DIAGNOSIS — O26892 Other specified pregnancy related conditions, second trimester: Secondary | ICD-10-CM | POA: Insufficient documentation

## 2024-09-08 DIAGNOSIS — O99891 Other specified diseases and conditions complicating pregnancy: Secondary | ICD-10-CM | POA: Diagnosis not present

## 2024-09-08 DIAGNOSIS — O358XX0 Maternal care for other (suspected) fetal abnormality and damage, not applicable or unspecified: Secondary | ICD-10-CM

## 2024-09-08 DIAGNOSIS — Z3A19 19 weeks gestation of pregnancy: Secondary | ICD-10-CM | POA: Insufficient documentation

## 2024-09-08 DIAGNOSIS — O468X2 Other antepartum hemorrhage, second trimester: Secondary | ICD-10-CM | POA: Diagnosis not present

## 2024-09-08 DIAGNOSIS — Z3400 Encounter for supervision of normal first pregnancy, unspecified trimester: Secondary | ICD-10-CM

## 2024-09-08 DIAGNOSIS — N898 Other specified noninflammatory disorders of vagina: Secondary | ICD-10-CM | POA: Insufficient documentation

## 2024-09-08 DIAGNOSIS — O4592 Premature separation of placenta, unspecified, second trimester: Secondary | ICD-10-CM | POA: Diagnosis not present

## 2024-09-08 DIAGNOSIS — Z3402 Encounter for supervision of normal first pregnancy, second trimester: Secondary | ICD-10-CM

## 2024-09-08 DIAGNOSIS — O208 Other hemorrhage in early pregnancy: Secondary | ICD-10-CM | POA: Insufficient documentation

## 2024-09-08 DIAGNOSIS — O4692 Antepartum hemorrhage, unspecified, second trimester: Secondary | ICD-10-CM

## 2024-09-08 NOTE — Progress Notes (Unsigned)
 Marland Kitchen  mfm

## 2024-09-08 NOTE — Progress Notes (Signed)
 Patient information  Patient Name: Stephanie [name] [facility]  Patient MRN:   [id number]  Referring practice: MFM Referring Provider: [facility])  Problem List   Patient Active Problem List   Diagnosis Date Noted   Subchorionic hematoma in second trimester 09/08/2024   Acute knee pain 07/02/2024   Supervision of normal first pregnancy 06/25/2024   Vaginal bleeding in pregnancy, second trimester (resolved at 11 weeks) 06/05/2024   Migraines 08/04/2014   Allergic rhinitis 04/03/2013   MDD (major depressive disorder), single episode, severe (HCC) 01/28/2013    Class: Chronic   Generalized anxiety disorder 01/28/2013   Maternal Fetal Medicine Consult Stephanie [name] is a 28 y.o. G1P0000 at [redacted]w[redacted]d here for ultrasound and consultation. She had low risk aneuploidy screening of a female fetus. Carrier screening was Negative for the basic screening (SMA, alpha-thal, beta-thal, and cystic fibroisis. Maternal serum AFP n/a. She has no acute concerns.   Today we focused on the following:    Subchorionic hematoma: The patient reports 2-3 episodes of vaginal bleeding early in the pregnancy when she was diagnosed with a subchorionic hematoma.  On today's ultrasound the placenta appears normal.  I discussed that this is a risk factor for placental abruption and growth restriction later in the pregnancy although most the time the growth is normal and abruption's are rare in the setting.  For this reason she will return in 9 weeks for 28-week ultrasound to assess the fetal growth.  If at that time the growth is normal and she has no further vaginal bleeding then she can follow-up with her OB provider.  Ultrasound findings Single intrauterine pregnancy at 19w 0d. Fetal cardiac activity:  Observed and appears normal. Presentation: Cephalic. The anatomic structures that were well seen appear normal without evidence of soft markers. The anatomic survey is complete.  Fetal  biometry shows the estimated fetal weight at the 83 percentile. Amniotic fluid: Within normal limits.  MVP: 6.36 cm. Placenta: Anterior. Adnexa: No abnormality visualized. Cervical length: 3.9 cm.  There are limitations of prenatal ultrasound such as the inability to detect certain abnormalities due to poor visualization. Various factors such as fetal position, gestational age and maternal body habitus may increase the difficulty in visualizing the fetal anatomy.    Recommendations -EDD should be 02/02/2025 based on  LMP  (04/28/24). -Follow up ultrasound at 28 weeks to reassess the fetal growth due to the history of [facility].  Review of Systems: A review of systems was performed and was negative except per HPI   Past Obstetrical History:  OB History  Gravida Para Term Preterm AB Living  1 0 0 0 0 0  SAB IAB Ectopic Multiple Live Births  0 0 0 0 0    # Outcome Date GA Lbr Len/2nd Weight Sex Type Anes PTL Lv  1 Current             Obstetric Comments  Menstrual age: 81    Age 1st Pregnancy: N/A     Past Medical History:  Past Medical History:  Diagnosis Date   Acute pancreatitis without infection or necrosis 04/21/2019   Anemia    Anxiety 01/28/2013   no meds   Axillary hidradenitis suppurativa 03/05/2019   Celiac disease    Depression    no meds   Endometriosis    Hidradenitis    IBS (irritable bowel syndrome)    Migraines    HA every other day - otc med prn  Recurrent streptococcal tonsillitis    Seasonal allergies    Seasonal environmental allergies   Sebaceous cyst 08/04/2018   L mid back    Vision abnormalities    Myopia; patient has history of devloping ulcers when she wore contacts.     Past Surgical History:    Past Surgical History:  Procedure Laterality Date   COLONOSCOPY     IBS   COLONOSCOPY     IBS   IRRIGATION AND DEBRIDEMENT ABSCESS Right 12/07/2021   Procedure: IRRIGATION AND DEBRIDEMENT AXILLARY ABSCESS;  Surgeon: Rubin Calamity, MD;   Location: [facility] OR;  Service: General;  Laterality: Right;   LAPAROSCOPY N/A 10/20/2018   Procedure: LAPAROSCOPY DIAGNOSTIC possible removal of endometriosis;  Surgeon: Dannielle Bouchard, DO;  Location: [facility] ORS;  Service: Gynecology;  Laterality: N/A;   UPPER GASTROINTESTINAL ENDOSCOPY  03/04/2020     Home Medications:   Current Outpatient Medications on File Prior to Visit  Medication Sig Dispense Refill   acetaminophen  (TYLENOL ) 500 MG tablet Take 1,000 mg by mouth every 6 (six) hours as needed.     aspirin  EC 81 MG tablet Take 1 tablet (81 mg total) by mouth at bedtime. Start taking when you are [redacted] weeks pregnant for rest of pregnancy for prevention of preeclampsia 300 tablet 2   EPINEPHrine  0.3 mg/0.3 mL IJ SOAJ injection Inject 0.3 mg into the muscle as needed for anaphylaxis. 1 each 0   Prenatal Vit-Fe Fumarate-FA (PRENATAL MULTIVITAMIN) TABS tablet Take 1 tablet by mouth daily at 12 noon.     promethazine  (PHENERGAN ) 25 MG tablet Take 1 tablet (25 mg total) by mouth every 6 (six) hours as needed for nausea or vomiting. 30 tablet 2   docusate sodium  (COLACE) 100 MG capsule Take 100 mg by mouth daily as needed for mild constipation. (Patient not taking: Reported on 09/08/2024)     ondansetron  (ZOFRAN -ODT) 8 MG disintegrating tablet Take 1 tablet (8 mg total) by mouth every 8 (eight) hours as needed for nausea or vomiting. (Patient not taking: Reported on 09/08/2024) 30 tablet 1   ondansetron  (ZOFRAN -ODT) 8 MG disintegrating tablet Take 0.5 tablets (4 mg total) by mouth every 8 (eight) hours as needed for nausea or vomiting. (Patient not taking: Reported on 09/08/2024) 30 tablet 1   [DISCONTINUED] metoCLOPramide  (REGLAN ) 10 MG tablet Take 1 tablet (10 mg total) by mouth every 6 (six) hours as needed for nausea or vomiting. (Patient not taking: Reported on 09/19/2019) 30 tablet 0   [DISCONTINUED] sucralfate  (CARAFATE ) 1 GM/10ML suspension Take 10 mLs (1 g total) by mouth 4 (four) times daily -  with  meals and at bedtime. 420 mL 0   No current facility-administered medications on file prior to visit.      Allergies:   Allergies  Allergen Reactions   Almond (Diagnostic) Other (See Comments)    [place]   Coconut Flavoring Agent (Non-Screening) Other (See Comments)    Mouth itches, but no impaired breathing (per patient   Gluten Meal    Septra  [Sulfamethoxazole -Trimethoprim ] Nausea Only and Other (See Comments)    Patient took a few courses of this and it ended up being stopped by a MD because it had started causing bad stomach pain (per the patient)     Physical Exam:   Vitals:   09/08/24 0809  BP: 122/67   Sitting comfortably on the sonogram table Nonlabored breathing Normal rate and rhythm Abdomen is nontender  Thank you for the opportunity to be involved with this patient's care. Please  let us  know if we can be of any further assistance.   45 minutes of time was spent reviewing the patient's chart including labs, imaging and documentation.  At least 50% of this time was spent with direct patient care discussing the diagnosis, management and prognosis of her care.  Delora Smaller MFM, [facility]   09/08/2024  9:50 AM

## 2024-09-09 ENCOUNTER — Encounter: Admitting: Obstetrics and Gynecology

## 2024-09-09 ENCOUNTER — Ambulatory Visit: Payer: Self-pay | Admitting: Family Medicine

## 2024-09-09 DIAGNOSIS — Z3402 Encounter for supervision of normal first pregnancy, second trimester: Secondary | ICD-10-CM

## 2024-09-10 ENCOUNTER — Encounter: Payer: Self-pay | Admitting: Obstetrics & Gynecology

## 2024-09-10 ENCOUNTER — Ambulatory Visit (INDEPENDENT_AMBULATORY_CARE_PROVIDER_SITE_OTHER): Admitting: Obstetrics & Gynecology

## 2024-09-10 VITALS — BP 114/71 | HR 114 | Wt 153.0 lb

## 2024-09-10 DIAGNOSIS — Z3A19 19 weeks gestation of pregnancy: Secondary | ICD-10-CM | POA: Diagnosis not present

## 2024-09-10 DIAGNOSIS — Z3402 Encounter for supervision of normal first pregnancy, second trimester: Secondary | ICD-10-CM | POA: Diagnosis not present

## 2024-09-10 NOTE — Progress Notes (Signed)
 PRENATAL VISIT NOTE  Subjective:  Stephanie [name] is a 28 y.o. G1P0000 at [redacted]w[redacted]d being seen today for ongoing prenatal care.  She is currently monitored for the following issues for this low-risk pregnancy and has MDD (major depressive disorder), single episode, severe (HCC); Generalized anxiety disorder; Allergic rhinitis; Migraines; Vaginal bleeding in pregnancy, second trimester (resolved at 11 weeks); Supervision of normal first pregnancy; Acute knee pain; and Subchorionic hematoma in second trimester on their problem list.  Patient reports no complaints.  Contractions: Not present. Vag. Bleeding: None.  Movement: Present. Denies leaking of fluid.   The following portions of the patient's history were reviewed and updated as appropriate: allergies, current medications, past family history, past medical history, past social history, past surgical history and problem list.   Objective:    Vitals:   09/10/24 1337  BP: 114/71  Pulse: (!) 114  Weight: 153 lb (69.4 kg)    Fetal Status:  Fetal Heart Rate (bpm): 150   Movement: Present    General: Alert, oriented and cooperative. Patient is in no acute distress.  Skin: Skin is warm and dry. No rash noted.   Cardiovascular: Normal heart rate noted  Respiratory: Normal respiratory effort, no problems with respiration noted  Abdomen: Soft, gravid, appropriate for gestational age.  Pain/Pressure: Present     Pelvic: Cervical exam deferred        Extremities: Normal range of motion.     Mental Status: Normal mood and affect. Normal behavior. Normal judgment and thought content.   US  MFM OB COMP + 14 WK Result Date: 09/08/2024 ----------------------------------------------------------------------  OBSTETRICS REPORT                       (Signed Final 09/08/2024 09:56 am) ---------------------------------------------------------------------- Patient Info  ID #:       [id number]                          D.O.B.:  10/25/1996 (28 yrs)(F)   Name:       Stephanie [name]                     Visit Date: 09/08/2024 07:28 am              [place] ---------------------------------------------------------------------- Performed By  Attending:        Delora Smaller DO       Ref. Address:     [address]                                                             [address]  Performed By:     Rumaldo Sharps RDMS      [facility]  Referred By:      [facility] Correne Beagle ---------------------------------------------------------------------- Orders  #  Description                           Code        Ordered By  1  US  MFM OB COMP + 14 WK                76805.01    TANYA PRATT ----------------------------------------------------------------------  #  Order #                     Accession #                Episode #  1  [id number]                   [id number]                 [id number] ---------------------------------------------------------------------- Indications  Subchorionic hemorrhage, antepartum            O45.90  Vaginal discharge during pregnancy in          O26.892  second trimester  Encounter for antenatal screening for          Z36.3  malformations  [redacted] weeks gestation of pregnancy                Z3A.19  Low risk NIPS female - Neg [place] ---------------------------------------------------------------------- Vital Signs  BP:          122/67 ---------------------------------------------------------------------- Fetal Evaluation  Num Of Fetuses:         1  Fetal Heart Rate(bpm):  161  Cardiac Activity:       Observed  Presentation:           Cephalic  Placenta:               Anterior  P. Cord Insertion:      Visualized, central  Amniotic Fluid  AFI FV:      Within normal limits                              Largest Pocket(cm)                               6.36 ---------------------------------------------------------------------- Biometry  BPD:      46.7  mm     G. Age:  20w 1d         90  %    CI:        70.92   %    70 - 86                                                          FL/HC:      17.5   %  16.1 - 18.3  HC:      176.7  mm     G. Age:  20w 1d         90  %    HC/AC:      1.24        1.09 - 1.39  AC:      142.8  mm     G. Age:  19w 4d         67  %    FL/BPD:     66.2   %  FL:       30.9  mm     G. Age:  19w 4d         65  %    FL/AC:      21.6   %    20 - 24  HUM:      30.7  mm     G. Age:  20w 1d         80  %  CER:      19.6  mm     G. Age:  19w 0d         36  %  NFT:       4.1  mm  LV:        6.7  mm  CM:        4.1  mm  Est. FW:     306  gm    0 lb 11 oz      83  % ---------------------------------------------------------------------- OB History  Gravidity:    1         Term:   0        Prem:   0        SAB:   0  TOP:          0       Ectopic:  0        Living: 0 ---------------------------------------------------------------------- Gestational Age  LMP:           19w 0d        Date:  04/28/24                  EDD:   02/02/25  U/S Today:     19w 6d                                        EDD:   01/27/25  Best:          19w 0d     Det. By:  LMP  (04/28/24)          EDD:   02/02/25 ---------------------------------------------------------------------- Targeted Anatomy  Central Nervous System  Calvarium/Cranial V.:  Appears normal         Cereb./Vermis:          Appears normal  Cavum:                 Appears normal         Cisterna Magna:         Appears normal  Lateral Ventricles:    Appears normal         Midline Falx:           Appears normal  Choroid Plexus:        Appears normal  Spine  Cervical:  Appears normal         Sacral:                 Appears normal  Thoracic:              Appears normal         Shape/Curvature:        Appears normal  Lumbar:                Appears normal  Head/Neck  Lips:                  Appears  normal         Profile:                Appears normal  Neck:                  Appears normal         Orbits/Eyes:            Appears normal  Nuchal Fold:           Appears normal         Mandible:               Appears normal  Nasal Bone:            Appears normal         Maxilla:                Appears normal  Thorax  4 Chamber View:        Appears normal         Interventr. Septum:     Appears normal  Cardiac Rhythm:        Normal                 Cardiac Axis:           Appears normal  Cardiac Situs:         Appears normal         Diaphragm:              Appears normal  Rt Outflow Tract:      Appears normal         3 Vessel View:          Appears normal  Lt Outflow Tract:      Appears normal         3 V Trachea View:       Appears normal  Aortic Arch:           Appears normal         IVC:                    Appears normal  Ductal Arch:           Appears normal         Crossing:               Appears normal  SVC:                   Appears normal  Abdomen  Ventral Wall:          Appears normal         Lt Kidney:              Appears normal  Cord Insertion:        Appears normal         Rt  Kidney:              Appears normal  Situs:                 Appears normal         Bladder:                Appears normal  Stomach:               Appears normal  Extremities  Lt Humerus:            Appears normal         Lt Femur:               Appears normal  Rt Humerus:            Appears normal         Rt Femur:               Appears normal  Lt Forearm:            Appears normal         Lt Lower Leg:           Appears normal  Rt Forearm:            Appears normal         Rt Lower Leg:           Appears normal  Lt Hand:               Open hand nml          Lt Foot:                Nml heel/foot  Rt Hand:               Open hand nml          Rt Foot:                Nml heel/foot  Other  Umbilical Cord:        Normal 3-vessel        Genitalia:              Female-nml  Comment:     Technically difficult due to fetal position.  ---------------------------------------------------------------------- Cervix Uterus Adnexa  Cervix  Length:            3.9  cm.  Normal appearance by transabdominal scan  Uterus  No abnormality visualized.  Right Ovary  Not visualized.  Left Ovary  Size(cm)     3.83   x   2.57   x  2.72      Vol(ml): 14.02  Within normal limits.  Cul De Sac  No free fluid seen.  Adnexa  No abnormality visualized. ---------------------------------------------------------------------- Comments  Maternal Fetal Medicine Consult  Teiara S Knudtson is a 28 y.o. G1P0000 at [redacted]w[redacted]d  here for ultrasound and consultation. She had low risk  aneuploidy screening of a female fetus. Carrier screening was  Negative for the basic screening (SMA, alpha-thal, beta-thal,  and cystic fibroisis. Maternal serum AFP n/a. She has no  acute concerns.  Today we focused on the following:  Subchorionic hematoma: The patient reports 2-3 episodes of  vaginal bleeding early in the pregnancy when she was  diagnosed with a subchorionic hematoma.  On today's  ultrasound the placenta appears normal.  I discussed that this  is a risk factor for placental abruption and growth restriction  later in the pregnancy although  most the time the growth is  normal and abruption's are rare in the setting.  For this  reason she will return in 9 weeks for 28-week ultrasound to  assess the fetal growth.  If at that time the growth is normal  and she has no further vaginal bleeding then she can follow-  up with her OB provider.  Ultrasound findings  Single intrauterine pregnancy at 19w 0d.  Fetal cardiac activity:  Observed and appears normal.  Presentation: Cephalic.  The anatomic structures that were well seen appear normal  without evidence of soft markers. The anatomic survey is  complete.  Fetal biometry shows the estimated fetal weight at the 83  percentile.  Amniotic fluid: Within normal limits.  MVP: 6.36 cm.  Placenta: Anterior.  Adnexa: No abnormality visualized.   Cervical length: 3.9 cm.  There are limitations of prenatal ultrasound such as the  inability to detect certain abnormalities due to poor  visualization. Various factors such as fetal position,  gestational age and maternal body habitus may increase the  difficulty in visualizing the fetal anatomy.  Recommendations  -EDD should be 02/02/2025 based on  LMP  (04/28/24).  -Follow up ultrasound at 28 weeks to reassess the fetal  growth due to the history of [facility]. ----------------------------------------------------------------------                   Delora Smaller, DO Electronically Signed Final Report   09/08/2024 09:56 am ----------------------------------------------------------------------    Assessment and Plan:  Pregnancy: G1P0000 at [redacted]w[redacted]d 1. [redacted] weeks gestation of pregnancy 2. Encounter for supervision of normal first pregnancy in second trimester (Primary) Normal anatomy scan reviewed with patient. No other concerns. Preterm labor symptoms and general obstetric precautions including but not limited to vaginal bleeding, contractions and leaking of fluid were reviewed in detail with the patient. Please refer to After Visit Summary for other counseling recommendations.   Return in about 4 weeks (around 10/08/2024) for OFFICE OB VISIT (MD or APP).  Future Appointments  Date Time Provider Department Center  10/08/2024  3:30 PM Fredirick Glenys RAMAN, MD [facility]  11/10/2024  2:00 PM [facility] PROVIDER 1 [facility]  11/10/2024  2:15 PM [facility] US1 [facility]    Gloris Hugger, MD

## 2024-10-06 ENCOUNTER — Other Ambulatory Visit: Payer: Self-pay

## 2024-10-06 ENCOUNTER — Inpatient Hospital Stay (HOSPITAL_COMMUNITY)
Admission: AD | Admit: 2024-10-06 | Discharge: 2024-10-06 | Disposition: A | Discharge status: Home / Self Care | Attending: Obstetrics and Gynecology | Admitting: Obstetrics and Gynecology

## 2024-10-06 DIAGNOSIS — O26892 Other specified pregnancy related conditions, second trimester: Secondary | ICD-10-CM | POA: Diagnosis not present

## 2024-10-06 DIAGNOSIS — O99012 Anemia complicating pregnancy, second trimester: Secondary | ICD-10-CM | POA: Insufficient documentation

## 2024-10-06 DIAGNOSIS — Z3A23 23 weeks gestation of pregnancy: Secondary | ICD-10-CM | POA: Diagnosis not present

## 2024-10-06 DIAGNOSIS — J00 Acute nasopharyngitis [common cold]: Secondary | ICD-10-CM | POA: Diagnosis present

## 2024-10-06 DIAGNOSIS — R519 Headache, unspecified: Secondary | ICD-10-CM | POA: Insufficient documentation

## 2024-10-06 DIAGNOSIS — O368120 Decreased fetal movements, second trimester, not applicable or unspecified: Secondary | ICD-10-CM | POA: Insufficient documentation

## 2024-10-06 DIAGNOSIS — Z3402 Encounter for supervision of normal first pregnancy, second trimester: Secondary | ICD-10-CM

## 2024-10-06 DIAGNOSIS — D509 Iron deficiency anemia, unspecified: Secondary | ICD-10-CM | POA: Insufficient documentation

## 2024-10-06 DIAGNOSIS — O99512 Diseases of the respiratory system complicating pregnancy, second trimester: Secondary | ICD-10-CM | POA: Diagnosis not present

## 2024-10-06 DIAGNOSIS — Z3689 Encounter for other specified antenatal screening: Secondary | ICD-10-CM

## 2024-10-06 DIAGNOSIS — O368190 Decreased fetal movements, unspecified trimester, not applicable or unspecified: Secondary | ICD-10-CM

## 2024-10-06 LAB — CBC WITH DIFFERENTIAL/PLATELET
Abs Immature Granulocytes: 0.03 K/uL (ref 0.00–0.07)
Basophils Absolute: 0 K/uL (ref 0.0–0.1)
Basophils Relative: 0 %
Eosinophils Absolute: 0 K/uL (ref 0.0–0.5)
Eosinophils Relative: 1 %
HCT: 32.4 % — ABNORMAL LOW (ref 36.0–46.0)
Hemoglobin: 10.4 g/dL — ABNORMAL LOW (ref 12.0–15.0)
Immature Granulocytes: 1 %
Lymphocytes Relative: 22 %
Lymphs Abs: 1.3 K/uL (ref 0.7–4.0)
MCH: 28.7 pg (ref 26.0–34.0)
MCHC: 32.1 g/dL (ref 30.0–36.0)
MCV: 89.3 fL (ref 80.0–100.0)
Monocytes Absolute: 0.4 K/uL (ref 0.1–1.0)
Monocytes Relative: 6 %
Neutro Abs: 4.3 K/uL (ref 1.7–7.7)
Neutrophils Relative %: 70 %
Platelets: 240 K/uL (ref 150–400)
RBC: 3.63 MIL/uL — ABNORMAL LOW (ref 3.87–5.11)
RDW: 14.1 % (ref 11.5–15.5)
WBC: 6.1 K/uL (ref 4.0–10.5)
nRBC: 0 % (ref 0.0–0.2)

## 2024-10-06 LAB — RESP PANEL BY RT-PCR (RSV, FLU A&B, COVID)  RVPGX2
Influenza A by PCR: NEGATIVE
Influenza B by PCR: NEGATIVE
Resp Syncytial Virus by PCR: NEGATIVE
SARS Coronavirus 2 by RT PCR: NEGATIVE

## 2024-10-06 LAB — URINALYSIS, ROUTINE W REFLEX MICROSCOPIC
Bilirubin Urine: NEGATIVE
Glucose, UA: NEGATIVE mg/dL
Hgb urine dipstick: NEGATIVE
Ketones, ur: NEGATIVE mg/dL
Leukocytes,Ua: NEGATIVE
Nitrite: NEGATIVE
Protein, ur: NEGATIVE mg/dL
Specific Gravity, Urine: 1.017 (ref 1.005–1.030)
pH: 7 (ref 5.0–8.0)

## 2024-10-06 LAB — GROUP A STREP BY PCR: Group A Strep by PCR: NOT DETECTED

## 2024-10-06 MED ORDER — ACETAMINOPHEN-CAFFEINE 500-65 MG PO TABS
2.0000 | ORAL_TABLET | Freq: Once | ORAL | Status: AC
  Administered 2024-10-06: 2 via ORAL
  Filled 2024-10-06: qty 2

## 2024-10-06 MED ORDER — ACETAMINOPHEN-CAFFEINE 500-65 MG PO TABS: 2.0000 | ORAL_TABLET | Freq: Four times a day (QID) | ORAL | 0 refills | Status: DC | PRN

## 2024-10-06 MED ORDER — FERROUS SULFATE 325 (65 FE) MG PO TABS: 325.0000 mg | ORAL_TABLET | Freq: Every day | ORAL | 0 refills | Status: DC

## 2024-10-06 MED ORDER — FLUTICASONE PROPIONATE 50 MCG/ACT NA SUSP
2.0000 | Freq: Every day | NASAL | Status: AC
  Administered 2024-10-06: 2 via NASAL
  Filled 2024-10-06: qty 16

## 2024-10-06 NOTE — MAU Provider Note (Signed)
 S Stephanie [name] is a 28 y.o. G1P0000 patient who presents to MAU today with complaint of she is [redacted] weeks pregnant reporting that starting last night she began to have a headache, sore throat, and some pain in her right ear.  She also reports no fetal movement since 2 PM and she reports she has felt FM's prior.    She denies any leaking of fluid, vaginal bleeding, abdominal pain or contractions, blurry vision, right upper quadrant pain, nausea vomiting.  Patient is rating her pain scale 7 out of 10 for headache and sore throat and she has not taken any medication for resolve.  She states she called her OB office and was told to come in for evaluation.  Manger of the ROS is negative unless otherwise noted in HPI above.   Her prenatal course is complicated by MDD, GAD, migraines, vaginal bleeding in second trimester with a subchorionic hematoma  O BP 122/71   Pulse (!) 114   Temp 98.6 F (37 C) (Oral)   Resp 17   LMP 04/28/2024 (Exact Date)   SpO2 100%  Physical Exam Vitals and nursing note reviewed.  Constitutional:      General: She is not in acute distress.    Appearance: Normal appearance. She is not ill-appearing.  HENT:     Head: Normocephalic.     Nose: Congestion present.  Cardiovascular:     Rate and Rhythm: Tachycardia present.  Pulmonary:     Effort: Pulmonary effort is normal.  Abdominal:     Palpations: Abdomen is soft.     Tenderness: There is no abdominal tenderness.  Musculoskeletal:        General: Normal range of motion.     Cervical back: Normal range of motion.  Skin:    General: Skin is warm.  Neurological:     Mental Status: She is alert and oriented to person, place, and time.  Psychiatric:        Mood and Affect: Mood normal.        Behavior: Behavior normal.     MDM  HIGH   Prenatal chart reviewed Physical exam performed Respiratory panel obtained: Neg for flu or COVID Strep culture: Negative Strep A UA: NM CBC : No  leukocytosis, hemoglobin at 10.4 consistent with anemia (will plan to send prescription at discharge) NST for gestational age and fetal reassurance ( Patient reports she feels fetal movements while on NST ) Excedrin for headache with resolve Flonase  for postnasal drip  No evidence of acute infection likely common cold.  List of approved medications in pregnancy provided to the patient and she reports resolved from her headache after Excedrin  Orders Placed This Encounter  Procedures   Group A Strep by PCR    Standing Status:   Standing    Number of Occurrences:   1    Patient immune status:   Normal    Release to patient:   Immediate [1]   Resp panel by RT-PCR (RSV, Flu A&B, Covid) Anterior Nasal Swab    Standing Status:   Standing    Number of Occurrences:   1    Patient immune status:   Normal   Urinalysis, Routine w reflex microscopic -Urine, Clean Catch    Standing Status:   Standing    Number of Occurrences:   1    Specimen Source:   Urine, Clean Catch [76]   CBC with Differential/Platelet    Standing Status:   Standing  Number of Occurrences:   1   Discharge patient Discharge disposition: 01-Home or Self Care; Discharge patient date: 10/06/2024    Standing Status:   Standing    Number of Occurrences:   1    Discharge disposition:   01-Home or Self Care [1]    Discharge patient date:   10/06/2024    Meds ordered this encounter  Medications   acetaminophen -caffeine (EXCEDRIN TENSION HEADACHE) 500-65 MG per tablet 2 tablet   fluticasone  (FLONASE ) 50 MCG/ACT nasal spray 2 spray   acetaminophen -caffeine (EXCEDRIN TENSION HEADACHE) 500-65 MG TABS per tablet    Sig: Take 2 tablets by mouth 4 (four) times daily as needed (Foe headaches).    Dispense:  60 tablet    Refill:  0    Supervising Provider:   PRATT, TANYA S [[id number]]   ferrous sulfate  325 (65 FE) MG tablet    Sig: Take 1 tablet (325 mg total) by mouth daily.    Dispense:  30 tablet    Refill:  0    Supervising  Provider:   PRATT, TANYA S [[id number]]     Results for orders placed or performed during the hospital encounter of 10/06/24 (from the past 24 hours)  Urinalysis, Routine w reflex microscopic -Urine, Clean Catch     Status: Abnormal   Collection Time: 10/06/24  9:14 AM  Result Value Ref Range   Color, Urine YELLOW YELLOW   APPearance HAZY (A) CLEAR   Specific Gravity, Urine 1.017 1.005 - 1.030   pH 7.0 5.0 - 8.0   Glucose, UA NEGATIVE NEGATIVE mg/dL   Hgb urine dipstick NEGATIVE NEGATIVE   Bilirubin Urine NEGATIVE NEGATIVE   Ketones, ur NEGATIVE NEGATIVE mg/dL   Protein, ur NEGATIVE NEGATIVE mg/dL   Nitrite NEGATIVE NEGATIVE   Leukocytes,Ua NEGATIVE NEGATIVE  Group A Strep by PCR     Status: None   Collection Time: 10/06/24  9:14 AM   Specimen: Urine, Clean Catch; Sterile Swab  Result Value Ref Range   Group A Strep by PCR NOT DETECTED NOT DETECTED  Resp panel by RT-PCR (RSV, Flu A&B, Covid) Urine, Clean Catch     Status: None   Collection Time: 10/06/24  9:15 AM   Specimen: Urine, Clean Catch; Nasal Swab  Result Value Ref Range   SARS Coronavirus 2 by RT PCR NEGATIVE NEGATIVE   Influenza A by PCR NEGATIVE NEGATIVE   Influenza B by PCR NEGATIVE NEGATIVE   Resp Syncytial Virus by PCR NEGATIVE NEGATIVE  CBC with Differential/Platelet     Status: Abnormal   Collection Time: 10/06/24  9:53 AM  Result Value Ref Range   WBC 6.1 4.0 - 10.5 K/uL   RBC 3.63 (L) 3.87 - 5.11 MIL/uL   Hemoglobin 10.4 (L) 12.0 - 15.0 g/dL   HCT 67.5 (L) 63.9 - 53.9 %   MCV 89.3 80.0 - 100.0 fL   MCH 28.7 26.0 - 34.0 pg   MCHC 32.1 30.0 - 36.0 g/dL   RDW 85.8 88.4 - 84.4 %   Platelets 240 150 - 400 K/uL   nRBC 0.0 0.0 - 0.2 %   Neutrophils Relative % 70 %   Neutro Abs 4.3 1.7 - 7.7 K/uL   Lymphocytes Relative 22 %   Lymphs Abs 1.3 0.7 - 4.0 K/uL   Monocytes Relative 6 %   Monocytes Absolute 0.4 0.1 - 1.0 K/uL   Eosinophils Relative 1 %   Eosinophils Absolute 0.0 0.0 - 0.5 K/uL   Basophils Relative 0 %  Basophils Absolute 0.0 0.0 - 0.1 K/uL   Immature Granulocytes 1 %   Abs Immature Granulocytes 0.03 0.00 - 0.07 K/uL     Fetal Tracing: NST Reactive for GA Baseline: 150 Variability: moderate Accelerations: present Decelerations:absent Toco: quite    I have reviewed the patient chart and performed the physical exam . I have ordered & interpreted the lab results and reviewed and interpreted the NST Medications ordered as stated below.  A/P as described below.  Counseling and education provided and patient agreeable  with plan as described below. Verbalized understanding.    ASSESSMENT Medical screening exam complete  Common cold  Encounter for supervision of normal first pregnancy in second trimester  [redacted] weeks gestation of pregnancy  NST (non-stress test) reactive on fetal surveillance  Nonintractable headache, unspecified chronicity pattern, unspecified headache type  Decreased fetal movement during pregnancy, antepartum, single or unspecified fetus  Iron deficiency anemia, unspecified iron deficiency anemia type     PLAN Future Appointments  Date Time Provider Department Center  10/08/2024  3:30 PM Fredirick Glenys RAMAN, MD [facility]  11/05/2024  8:00 AM [facility]  11/05/2024  8:35 AM Emilio Delilah HERO, CNM [facility]  11/10/2024  2:15 PM [facility] PROVIDER 1 [facility]  11/10/2024  2:30 PM [facility] US1 [facility]    Discharge from MAU in stable condition  See AVS for full description of educational information and instructions provided to the patient at time of discharge   Warning signs for worsening condition that would warrant emergency follow-up discussed  Patient may return to MAU as needed   Allergies as of 10/06/2024       Reactions   Almond (diagnostic) Other (See Comments)   [place]   Coconut Flavoring Agent (non-screening) Other (See Comments)   Mouth itches, but no impaired breathing (per patient    Gluten Meal    Septra  [sulfamethoxazole -trimethoprim ] Nausea Only, Other (See Comments)   Patient took a few courses of this and it ended up being stopped by a MD because it had started causing bad stomach pain (per the patient)        Medication List     TAKE these medications    acetaminophen  500 MG tablet Commonly known as: TYLENOL  Take 1,000 mg by mouth every 6 (six) hours as needed.   acetaminophen -caffeine 500-65 MG Tabs per tablet Commonly known as: EXCEDRIN TENSION HEADACHE Take 2 tablets by mouth 4 (four) times daily as needed (Foe headaches).   aspirin  EC 81 MG tablet Take 1 tablet (81 mg total) by mouth at bedtime. Start taking when you are [redacted] weeks pregnant for rest of pregnancy for prevention of preeclampsia   docusate sodium  100 MG capsule Commonly known as: COLACE Take 100 mg by mouth daily as needed for mild constipation.   EPINEPHrine  0.3 mg/0.3 mL Soaj injection Commonly known as: EPI-PEN Inject 0.3 mg into the muscle as needed for anaphylaxis.   ferrous sulfate  325 (65 FE) MG tablet Take 1 tablet (325 mg total) by mouth daily.   ondansetron  8 MG disintegrating tablet Commonly known as: ZOFRAN -ODT Take 1 tablet (8 mg total) by mouth every 8 (eight) hours as needed for nausea or vomiting.   ondansetron  8 MG disintegrating tablet Commonly known as: ZOFRAN -ODT Take 0.5 tablets (4 mg total) by mouth every 8 (eight) hours as needed for nausea or vomiting.   prenatal multivitamin Tabs tablet Take 1 tablet by mouth daily at 12 noon.   promethazine  25 MG tablet Commonly  known as: PHENERGAN  Take 1 tablet (25 mg total) by mouth every 6 (six) hours as needed for nausea or vomiting.         Stephanie [name] LABOR, NP 10/06/2024 12:23 PM   This chart was dictated using voice recognition software, Dragon. Despite the best efforts of this provider to proofread and correct errors, errors may still occur which can change documentation meaning.

## 2024-10-06 NOTE — MAU Note (Addendum)
 Stephanie [name] is a 28 y.o. at [redacted]w[redacted]d here in MAU reporting: starting last night HA, sore throat, right ear pain and no FM since 1400 . She denies LOF, VB, regular contractions, blurry vision, peripheral edema, or RUQ pain.   LMP: na Onset of complaint: 1400 Pain score: 7/10 Vitals:   10/06/24 0912  BP: 129/73  Pulse: (!) 118  Resp: 18  Temp: 98.6 F (37 C)     FHT: 160  Lab orders placed from triage: ua, flu, strep cultures.

## 2024-10-06 NOTE — Discharge Instructions (Signed)

## 2024-10-08 ENCOUNTER — Ambulatory Visit (INDEPENDENT_AMBULATORY_CARE_PROVIDER_SITE_OTHER): Admitting: Family Medicine

## 2024-10-08 ENCOUNTER — Encounter: Payer: Self-pay | Admitting: Family Medicine

## 2024-10-08 VITALS — BP 122/75 | HR 112 | Wt 155.6 lb

## 2024-10-08 DIAGNOSIS — Z3A23 23 weeks gestation of pregnancy: Secondary | ICD-10-CM

## 2024-10-08 DIAGNOSIS — Z3402 Encounter for supervision of normal first pregnancy, second trimester: Secondary | ICD-10-CM

## 2024-10-08 NOTE — Progress Notes (Signed)
 PRENATAL VISIT NOTE  Subjective:  Stephanie [name] is a 28 y.o. G1P0000 at [redacted]w[redacted]d being seen today for ongoing prenatal care.  She is currently monitored for the following issues for this low-risk pregnancy and has MDD (major depressive disorder), single episode, severe (HCC); Generalized anxiety disorder; Allergic rhinitis; Migraines; Vaginal bleeding in pregnancy, second trimester (resolved at 11 weeks); Supervision of normal first pregnancy; Acute knee pain; and Subchorionic hematoma in second trimester on their problem list.  Patient reports no complaints.  Contractions: Not present. Vag. Bleeding: None.  Movement: Present. Denies leaking of fluid.   The following portions of the patient's history were reviewed and updated as appropriate: allergies, current medications, past family history, past medical history, past social history, past surgical history and problem list.   Objective:   Vitals:   10/08/24 1534  BP: 122/75  Pulse: (!) 112  Weight: 155 lb 9.6 oz (70.6 kg)    Fetal Status:  Fetal Heart Rate (bpm): 150   Movement: Present    General: Alert, oriented and cooperative. Patient is in no acute distress.  Skin: Skin is warm and dry. No rash noted.   Cardiovascular: Normal heart rate noted  Respiratory: Normal respiratory effort, no problems with respiration noted  Abdomen: Soft, gravid, appropriate for gestational age.  Pain/Pressure: Present     Pelvic: Cervical exam deferred        Extremities: Normal range of motion.  Edema: None  Mental Status: Normal mood and affect. Normal behavior. Normal judgment and thought content.      07/08/2024    3:25 PM 05/28/2024   11:11 AM 02/24/2024    2:56 PM  Depression screen PHQ 2/9  Decreased Interest 0 1 0  Down, Depressed, Hopeless 0 1 0  PHQ - 2 Score 0 2 0  Altered sleeping 0 2   Tired, decreased energy 1 1   Change in appetite 0 0   Feeling bad or failure about yourself  0 0   Trouble concentrating 0 2   Moving  slowly or fidgety/restless 0 0   Suicidal thoughts 0 0   PHQ-9 Score 1  7    Difficult doing work/chores Not difficult at all Not difficult at all      Data saved with a previous flowsheet row definition        07/08/2024    3:26 PM 12/25/2023   11:25 AM 05/02/2023    2:49 PM 08/02/2021    8:20 AM  GAD 7 : Generalized Anxiety Score  Nervous, Anxious, on Edge 2 1 2 3   Control/stop worrying 1 1 1 3   Worry too much - different things 1 1 1 3   Trouble relaxing 1 1 1 1   Restless 0 1 1 2   Easily annoyed or irritable 1 2 1 3   Afraid - awful might happen 0 1 0 2  Total GAD 7 Score 6 8 7 17   Anxiety Difficulty Not difficult at all Not difficult at all Not difficult at all Very difficult    Assessment and Plan:  Pregnancy: G1P0000 at [redacted]w[redacted]d 1. Encounter for supervision of normal first pregnancy in second trimester (Primary) Continue prenatal care.  2. [redacted] weeks gestation of pregnancy    Preterm labor symptoms and general obstetric precautions including but not limited to vaginal bleeding, contractions, leaking of fluid and fetal movement were reviewed in detail with the patient. Please refer to After Visit Summary for other counseling recommendations.   Return in 4 weeks (on 11/05/2024) for  28 wk labs.  Future Appointments  Date Time Provider Department Center  11/05/2024  8:00 AM [facility] LAB [facility]  11/05/2024  8:35 AM Emilio Delilah HERO, CNM [facility]  11/10/2024  2:15 PM [facility] PROVIDER 1 [facility]  11/10/2024  2:30 PM [facility] US1 [facility]    Glenys GORMAN Birk, MD

## 2024-10-10 ENCOUNTER — Inpatient Hospital Stay (HOSPITAL_COMMUNITY)
Admission: AD | Admit: 2024-10-10 | Discharge: 2024-10-10 | Disposition: A | Discharge status: Home / Self Care | Attending: Obstetrics & Gynecology | Admitting: Obstetrics & Gynecology

## 2024-10-10 ENCOUNTER — Encounter (HOSPITAL_COMMUNITY): Payer: Self-pay | Admitting: Obstetrics & Gynecology

## 2024-10-10 DIAGNOSIS — Z3A23 23 weeks gestation of pregnancy: Secondary | ICD-10-CM

## 2024-10-10 DIAGNOSIS — Z3689 Encounter for other specified antenatal screening: Secondary | ICD-10-CM

## 2024-10-10 DIAGNOSIS — B3731 Acute candidiasis of vulva and vagina: Secondary | ICD-10-CM | POA: Diagnosis not present

## 2024-10-10 DIAGNOSIS — N760 Acute vaginitis: Secondary | ICD-10-CM | POA: Diagnosis not present

## 2024-10-10 DIAGNOSIS — F32A Depression, unspecified: Secondary | ICD-10-CM | POA: Diagnosis not present

## 2024-10-10 DIAGNOSIS — O98812 Other maternal infectious and parasitic diseases complicating pregnancy, second trimester: Secondary | ICD-10-CM | POA: Diagnosis not present

## 2024-10-10 DIAGNOSIS — B9689 Other specified bacterial agents as the cause of diseases classified elsewhere: Secondary | ICD-10-CM | POA: Diagnosis not present

## 2024-10-10 DIAGNOSIS — O26892 Other specified pregnancy related conditions, second trimester: Secondary | ICD-10-CM

## 2024-10-10 DIAGNOSIS — F411 Generalized anxiety disorder: Secondary | ICD-10-CM | POA: Diagnosis not present

## 2024-10-10 DIAGNOSIS — N898 Other specified noninflammatory disorders of vagina: Secondary | ICD-10-CM | POA: Diagnosis present

## 2024-10-10 DIAGNOSIS — M549 Dorsalgia, unspecified: Secondary | ICD-10-CM | POA: Diagnosis present

## 2024-10-10 DIAGNOSIS — Z113 Encounter for screening for infections with a predominantly sexual mode of transmission: Secondary | ICD-10-CM | POA: Diagnosis present

## 2024-10-10 DIAGNOSIS — R350 Frequency of micturition: Secondary | ICD-10-CM | POA: Diagnosis not present

## 2024-10-10 DIAGNOSIS — O99342 Other mental disorders complicating pregnancy, second trimester: Secondary | ICD-10-CM | POA: Diagnosis not present

## 2024-10-10 DIAGNOSIS — R102 Pelvic and perineal pain unspecified side: Secondary | ICD-10-CM | POA: Insufficient documentation

## 2024-10-10 LAB — URINALYSIS, ROUTINE W REFLEX MICROSCOPIC
Bilirubin Urine: NEGATIVE
Glucose, UA: NEGATIVE mg/dL
Hgb urine dipstick: NEGATIVE
Ketones, ur: NEGATIVE mg/dL
Leukocytes,Ua: NEGATIVE
Nitrite: NEGATIVE
Protein, ur: 30 mg/dL — AB
Specific Gravity, Urine: 1.034 — ABNORMAL HIGH (ref 1.005–1.030)
pH: 6 (ref 5.0–8.0)

## 2024-10-10 LAB — WET PREP, GENITAL
Sperm: NONE SEEN
Trich, Wet Prep: NONE SEEN
WBC, Wet Prep HPF POC: <10 (ref <10)
Yeast Wet Prep HPF POC: NONE SEEN

## 2024-10-10 MED ORDER — METRONIDAZOLE 500 MG PO TABS: 500.0000 mg | ORAL_TABLET | Freq: Two times a day (BID) | ORAL | 0 refills | Status: DC

## 2024-10-10 MED ORDER — NYSTATIN-TRIAMCINOLONE 100000-0.1 UNIT/GM-% EX CREA: TOPICAL_CREAM | CUTANEOUS | 0 refills | Status: DC

## 2024-10-10 NOTE — MAU Provider Note (Signed)
 Chief Complaint:  mucus plug, Abdominal Pain, and pelvic pressure   HPI     Stephanie [name] is a 28 y.o. G1P0000 at [redacted]w[redacted]d who presents to maternity admissions reporting that she has had noticed an increase in mucoid discharge.  Today she felt some lower abdominal cramping and tightening in the abdomen approximately 2-3 times a day.  She also noticed some brown discharge when wiping after using the bathroom and has some increased pelvic pressure.  She denies any active red vaginal bleeding, leaking of fluid, reports no increased vaginal odor/ itching or vaginal pain and denies any recent sexual activity.  She does feel good fetal movements.  Patient also reports that she does have urinary frequency and feels pressure on   her bladder with some feeling of urinary retention.  . The remainder of the ROS is negative unless otherwise noted above in HPI  Pregnancy Course: [facility]  Patient's prenatal course is complicated by MDD, GAD, migraines, vaginal bleeding in second trimester with subchorionic hematoma  Past Medical History:  Diagnosis Date   Acute pancreatitis without infection or necrosis 04/21/2019   Anemia    Anxiety 01/28/2013   no meds   Axillary hidradenitis suppurativa 03/05/2019   Celiac disease    Depression    no meds   Endometriosis    Hidradenitis    IBS (irritable bowel syndrome)    Migraines    HA every other day - otc med prn   Recurrent streptococcal tonsillitis    Seasonal allergies    Seasonal environmental allergies   Sebaceous cyst 08/04/2018   L mid back    Vision abnormalities    Myopia; patient has history of devloping ulcers when she wore contacts.   OB History  Gravida Para Term Preterm AB Living  1 0 0 0 0 0  SAB IAB Ectopic Multiple Live Births  0 0 0 0 0    # Outcome Date GA Lbr Len/2nd Weight Sex Type Anes PTL Lv  1 Current             Obstetric Comments  Menstrual age: 13    Age 1st Pregnancy: N/A   Past Surgical History:   Procedure Laterality Date   COLONOSCOPY     IBS   COLONOSCOPY     IBS   IRRIGATION AND DEBRIDEMENT ABSCESS Right 12/07/2021   Procedure: IRRIGATION AND DEBRIDEMENT AXILLARY ABSCESS;  Surgeon: Rubin Calamity, MD;  Location: [facility] OR;  Service: General;  Laterality: Right;   LAPAROSCOPY N/A 10/20/2018   Procedure: LAPAROSCOPY DIAGNOSTIC possible removal of endometriosis;  Surgeon: Dannielle Bouchard, DO;  Location: [facility];  Service: Gynecology;  Laterality: N/A;   UPPER GASTROINTESTINAL ENDOSCOPY  03/04/2020   Family History  Problem Relation Age of Onset   Depression Mother    Migraines Mother    Irritable bowel syndrome Mother    Hypertension Mother    Migraines Father    Diabetes Father    Diabetes Maternal Grandmother    Hypertension Maternal Grandmother    Diabetes Sister    Stomach cancer Sister 79   Colon cancer Neg Hx    Esophageal cancer Neg Hx    Rectal cancer Neg Hx    Social History   Tobacco Use   Smoking status: Never   Smokeless tobacco: Never  Vaping Use   Vaping status: Never Used  Substance Use Topics   Alcohol use: Never    Comment: socially   Drug use: Not Currently   Allergies  Allergen Reactions   Almond (Diagnostic) Other (See Comments)    unkmnown   Coconut Flavoring Agent (Non-Screening) Other (See Comments)    Mouth itches, but no impaired breathing (per patient   Gluten Meal    Septra  [Sulfamethoxazole -Trimethoprim ] Nausea Only and Other (See Comments)    Patient took a few courses of this and it ended up being stopped by a MD because it had started causing bad stomach pain (per the patient)   Medications Prior to Admission  Medication Sig Dispense Refill Last Dose/Taking   aspirin  EC 81 MG tablet Take 1 tablet (81 mg total) by mouth at bedtime. Start taking when you are [redacted] weeks pregnant for rest of pregnancy for prevention of preeclampsia 300 tablet 2 10/09/2024   Prenatal Vit-Fe Fumarate-FA (PRENATAL MULTIVITAMIN) TABS tablet Take 1 tablet  by mouth daily at 12 noon.   10/09/2024   acetaminophen  (TYLENOL ) 500 MG tablet Take 1,000 mg by mouth every 6 (six) hours as needed.   Unknown   acetaminophen -caffeine  (EXCEDRIN TENSION HEADACHE) 500-65 MG TABS per tablet Take 2 tablets by mouth 4 (four) times daily as needed (Foe headaches). 60 tablet 0    docusate sodium  (COLACE) 100 MG capsule Take 100 mg by mouth daily as needed for mild constipation. (Patient not taking: Reported on 09/10/2024)      EPINEPHrine  0.3 mg/0.3 mL IJ SOAJ injection Inject 0.3 mg into the muscle as needed for anaphylaxis. (Patient not taking: Reported on 09/10/2024) 1 each 0    ferrous sulfate  325 (65 FE) MG tablet Take 1 tablet (325 mg total) by mouth daily. (Patient not taking: Reported on 10/08/2024) 30 tablet 0    ondansetron  (ZOFRAN -ODT) 8 MG disintegrating tablet Take 1 tablet (8 mg total) by mouth every 8 (eight) hours as needed for nausea or vomiting. (Patient not taking: Reported on 09/10/2024) 30 tablet 1    ondansetron  (ZOFRAN -ODT) 8 MG disintegrating tablet Take 0.5 tablets (4 mg total) by mouth every 8 (eight) hours as needed for nausea or vomiting. (Patient not taking: Reported on 09/10/2024) 30 tablet 1    promethazine  (PHENERGAN ) 25 MG tablet Take 1 tablet (25 mg total) by mouth every 6 (six) hours as needed for nausea or vomiting. (Patient not taking: Reported on 09/10/2024) 30 tablet 2     I have reviewed patient's Past Medical Hx, Surgical Hx, Family Hx, Social Hx, medications and allergies.   ROS  Pertinent items noted in HPI and remainder of comprehensive ROS otherwise negative.   PHYSICAL EXAM  Patient Vitals for the past 24 hrs:  BP Temp Temp src Pulse Resp SpO2 Height Weight  10/10/24 1521 -- -- -- -- -- 100 % -- --  10/10/24 1516 -- -- -- -- -- 100 % -- --  10/10/24 1513 133/87 -- -- (!) 113 18 -- -- --  10/10/24 1511 -- -- -- -- -- 100 % -- --  10/10/24 1451 130/71 98.2 F (36.8 C) Oral (!) 113 19 100 % 5' 3 (1.6 m) 70.3 kg     Constitutional: Well-developed, well-nourished female in no acute distress.  Cardiovascular: normal rate & rhythm, warm and well-perfused Respiratory: normal effort, no problems with respiration noted GI: Abd soft, non-tender, gravid, with no pain on palpation illicited  MS: Extremities nontender, no edema, normal ROM Neurologic: Alert and oriented x 4.  Pelvic:  Chaperoned by Rhoda RN  SSE: External vulvar area is  edematous, no lesions, no erythema on external area.  In the vaginal vault there is no  evidence of pooling, there is white increased vaginal discharge on the vaginal walls and at the cervix appears to be milky white, cervix is visually closed at the os.  (Vaginal swabs obtained Suspect vaginal infection) Wet prep with Clue cells c/w BV (  Will plan for Rx at discharge ) and RX for Mycolog  at bedtime external for vulvitis      Fetal Tracing: NST reactive for GA  Baseline:150-155 Variability: moderate  Accelerations: present Decelerations: absent Toco: UI    Labs: Results for orders placed or performed during the hospital encounter of 10/10/24 (from the past 24 hours)  Urinalysis, Routine w reflex microscopic -Urine, Clean Catch     Status: Abnormal   Collection Time: 10/10/24  2:57 PM  Result Value Ref Range   Color, Urine AMBER (A) YELLOW   APPearance HAZY (A) CLEAR   Specific Gravity, Urine 1.034 (H) 1.005 - 1.030   pH 6.0 5.0 - 8.0   Glucose, UA NEGATIVE NEGATIVE mg/dL   Hgb urine dipstick NEGATIVE NEGATIVE   Bilirubin Urine NEGATIVE NEGATIVE   Ketones, ur NEGATIVE NEGATIVE mg/dL   Protein, ur 30 (A) NEGATIVE mg/dL   Nitrite NEGATIVE NEGATIVE   Leukocytes,Ua NEGATIVE NEGATIVE   RBC / HPF 0-5 0 - 5 RBC/hpf   WBC, UA 0-5 0 - 5 WBC/hpf   Bacteria, UA RARE (A) NONE SEEN   Squamous Epithelial / HPF 0-5 0 - 5 /HPF   Mucus PRESENT   Wet prep, genital     Status: Abnormal   Collection Time: 10/10/24  3:27 PM   Specimen: Vaginal  Result Value Ref Range   Yeast  Wet Prep HPF POC NONE SEEN NONE SEEN   Trich, Wet Prep NONE SEEN NONE SEEN   Clue Cells Wet Prep HPF POC PRESENT (A) NONE SEEN   WBC, Wet Prep HPF POC <10 <10   Sperm NONE SEEN      MDM & MAU COURSE  MDM:  HIGH  Prenatal chart reviewed Physical exam performed with pelvic and speculum Vaginal swab sent: Wet prep c/w Clue Cells /  GC Pending at discharge  UA: no evidence of UTI at this time  NST for gestational age and fetal reassurance-NST is reactive for gestational age of [redacted] weeks and 4 days, no contractions on toco   MAU Course: Orders Placed This Encounter  Procedures   Wet prep, genital   Urinalysis, Routine w reflex microscopic -Urine, Clean Catch    I have reviewed the patient chart and performed the physical exam . I have ordered & interpreted the lab results and reviewed and interpreted the NST  Medications ordered as stated below.  A/P as described below.  Counseling and education provided and patient agreeable  with plan as described below. Verbalized understanding.    ASSESSMENT   1. Bacterial vaginosis   2. Candidal vulvitis   3. [redacted] weeks gestation of pregnancy   4. NST (non-stress test) reactive on fetal surveillance     PLAN  Discharge home in stable condition with return precautions.   See AVS for full description of information given to the patient including both verbal and written. Patient verbalized understanding and agrees with the plan as described above.     Follow-up Information     [facility] for [facility] at [facility] Follow up.   Specialty: Obstetrics and Gynecology Why: If symptoms worsen or fail to resolve, As scheduled for ongoing prenatal care Contact information: [address] [place]  [place]  [address] [phone]                 Olam Dalton, MSN, [facility], Center for [facility]   ------------------------------------------------------------------------------------------------- Allergies as of 10/10/2024       Reactions   Almond (diagnostic) Other (See Comments)   unkmnown   Coconut Flavoring Agent (non-screening) Other (See Comments)   Mouth itches, but no impaired breathing (per patient   Gluten Meal    Septra  [sulfamethoxazole -trimethoprim ] Nausea Only, Other (See Comments)   Patient took a few courses of this and it ended up being stopped by a MD because it had started causing bad stomach pain (per the patient)        Medication List     TAKE these medications    acetaminophen  500 MG tablet Commonly known as: TYLENOL  Take 1,000 mg by mouth every 6 (six) hours as needed.   acetaminophen -caffeine  500-65 MG Tabs per tablet Commonly known as: EXCEDRIN TENSION HEADACHE Take 2 tablets by mouth 4 (four) times daily as needed (Foe headaches).   aspirin  EC 81 MG tablet Take 1 tablet (81 mg total) by mouth at bedtime. Start taking when you are [redacted] weeks pregnant for rest of pregnancy for prevention of preeclampsia   docusate sodium  100 MG capsule Commonly known as: COLACE Take 100 mg by mouth daily as needed for mild constipation.   EPINEPHrine  0.3 mg/0.3 mL Soaj injection Commonly known as: EPI-PEN Inject 0.3 mg into the muscle as needed for anaphylaxis.   ferrous sulfate  325 (65 FE) MG tablet Take 1 tablet (325 mg total) by mouth daily.   metroNIDAZOLE  500 MG tablet Commonly known as: FLAGYL  Take 1 tablet (500 mg total) by mouth 2 (two) times daily.   nystatin -triamcinolone  cream Commonly known as: MYCOLOG II Apply  externally to affected area daily prn   ondansetron  8 MG disintegrating tablet Commonly known as: ZOFRAN -ODT Take 1 tablet (8 mg total) by mouth every 8 (eight) hours as needed for nausea or vomiting.   ondansetron  8 MG disintegrating tablet Commonly known as: ZOFRAN -ODT Take 0.5 tablets (4 mg total) by mouth every 8 (eight)  hours as needed for nausea or vomiting.   prenatal multivitamin Tabs tablet Take 1 tablet by mouth daily at 12 noon.   promethazine  25 MG tablet Commonly known as: PHENERGAN  Take 1 tablet (25 mg total) by mouth every 6 (six) hours as needed for nausea or vomiting.         This chart was dictated using voice recognition software, Dragon. Despite the best efforts of this provider to proofread and correct errors, errors may still occur which can change documentation meaning.

## 2024-10-10 NOTE — MAU Note (Signed)
 Stephanie [name] is a 28 y.o. at [redacted]w[redacted]d here in MAU reporting: last night she noticed an increase in mucus discharge. Today she felt lower cramping and tightening in her abdomen 2-3 times. Also noticed more brown/pink discharge today with wiping. Reports an increase in pelvic pressure. Denies any other vaginal bleeding. Patient denies any other unusual vaginal discharge, vaginal odor, itching or pain. Denies any recent sexual intercourse. Reports +FM  Patient denies any pain with urination or urinary frequency but feels more pressure in her bladder after she urinates. On and off back pain as well.  Denies any N/V/D  Patient walking slowly to room and seems uncomfortable.  Onset of complaint: today  Pain score: 6 Vitals:   10/10/24 1451  BP: 130/71  Pulse: (!) 113  Resp: 19  Temp: 98.2 F (36.8 C)  SpO2: 100%     FHT:158 Lab orders placed from triage:  UA

## 2024-10-12 LAB — GC/CHLAMYDIA PROBE AMP (~~LOC~~) NOT AT ARMC
Chlamydia: NEGATIVE
Comment: NEGATIVE
Comment: NORMAL
Neisseria Gonorrhea: NEGATIVE

## 2024-11-05 ENCOUNTER — Other Ambulatory Visit (HOSPITAL_COMMUNITY)
Admission: RE | Admit: 2024-11-05 | Discharge: 2024-11-05 | Disposition: A | Source: Ambulatory Visit | Attending: Certified Nurse Midwife | Admitting: Certified Nurse Midwife

## 2024-11-05 ENCOUNTER — Ambulatory Visit: Admitting: Certified Nurse Midwife

## 2024-11-05 ENCOUNTER — Encounter: Payer: Self-pay | Admitting: Certified Nurse Midwife

## 2024-11-05 ENCOUNTER — Other Ambulatory Visit

## 2024-11-05 VITALS — BP 125/80 | HR 105 | Wt 160.2 lb

## 2024-11-05 DIAGNOSIS — Z23 Encounter for immunization: Secondary | ICD-10-CM | POA: Diagnosis not present

## 2024-11-05 DIAGNOSIS — Z3A27 27 weeks gestation of pregnancy: Secondary | ICD-10-CM | POA: Diagnosis not present

## 2024-11-05 DIAGNOSIS — N760 Acute vaginitis: Secondary | ICD-10-CM | POA: Diagnosis not present

## 2024-11-05 DIAGNOSIS — B9689 Other specified bacterial agents as the cause of diseases classified elsewhere: Secondary | ICD-10-CM | POA: Diagnosis not present

## 2024-11-05 DIAGNOSIS — Z3492 Encounter for supervision of normal pregnancy, unspecified, second trimester: Secondary | ICD-10-CM | POA: Diagnosis not present

## 2024-11-05 DIAGNOSIS — Z3402 Encounter for supervision of normal first pregnancy, second trimester: Secondary | ICD-10-CM

## 2024-11-05 NOTE — Progress Notes (Signed)
 PRENATAL VISIT NOTE  Subjective:  Stephanie [name] is a 28 y.o. G1P0000 at [redacted]w[redacted]d being seen today for ongoing prenatal care.  She is currently monitored for the following issues for this low-risk pregnancy and has MDD (major depressive disorder), single episode, severe (HCC); Generalized anxiety disorder; Allergic rhinitis; Migraines; Vaginal bleeding in pregnancy, second trimester (resolved at 11 weeks); Supervision of normal first pregnancy; Acute knee pain; and Subchorionic hematoma in second trimester on their problem list.  Patient reports continued milky white vaginal discharge with intermittent lower abdominal tightening.  Contractions: Not present. Vag. Bleeding: None.  Movement: Present. Denies leaking of fluid.   The following portions of the patient's history were reviewed and updated as appropriate: allergies, current medications, past family history, past medical history, past social history, past surgical history and problem list.   Objective:   Vitals:   11/05/24 0819  BP: 125/80  Pulse: (!) 105  Weight: 160 lb 4 oz (72.7 kg)    Fetal Status:  Fetal Heart Rate (bpm): 150   Movement: Present    General: Alert, oriented and cooperative. Patient is in no acute distress.  Skin: Skin is warm and dry. No rash noted.   Cardiovascular: Normal heart rate noted  Respiratory: Normal respiratory effort, no problems with respiration noted  Abdomen: Soft, gravid, appropriate for gestational age.  Pain/Pressure: Present     Pelvic: Cervical exam deferred        Extremities: Normal range of motion.  Edema: Trace  Mental Status: Normal mood and affect. Normal behavior. Normal judgment and thought content.      07/08/2024    3:25 PM 05/28/2024   11:11 AM 02/24/2024    2:56 PM  Depression screen PHQ 2/9  Decreased Interest 0 1 0  Down, Depressed, Hopeless 0 1 0  PHQ - 2 Score 0 2 0  Altered sleeping 0 2   Tired, decreased energy 1 1   Change in appetite 0 0   Feeling bad or  failure about yourself  0 0   Trouble concentrating 0 2   Moving slowly or fidgety/restless 0 0   Suicidal thoughts 0 0   PHQ-9 Score 1  7    Difficult doing work/chores Not difficult at all Not difficult at all      Data saved with a previous flowsheet row definition        07/08/2024    3:26 PM 12/25/2023   11:25 AM 05/02/2023    2:49 PM 08/02/2021    8:20 AM  GAD 7 : Generalized Anxiety Score  Nervous, Anxious, on Edge 2 1 2 3   Control/stop worrying 1 1 1 3   Worry too much - different things 1 1 1 3   Trouble relaxing 1 1 1 1   Restless 0 1 1 2   Easily annoyed or irritable 1 2 1 3   Afraid - awful might happen 0 1 0 2  Total GAD 7 Score 6 8 7 17   Anxiety Difficulty Not difficult at all Not difficult at all Not difficult at all Very difficult    Assessment and Plan:  Pregnancy: G1P0000 at [redacted]w[redacted]d 1. Encounter for supervision of low-risk pregnancy in second trimester (Primary) - Patient overall doing well.  - Reports frequent and vigorous fetal movement   2. [redacted] weeks gestation of pregnancy - GTT today.   3. Need for Tdap vaccination - patient and husband both desire tdap vaccine today.   4. Bacterial vaginosis - Previous dx'd with BV desires repeat treatment.  -  Reviewed normal vaginal discharge in pregnancy.  - Self swab obtained. If infection noted, then will treat.   Preterm labor symptoms and general obstetric precautions including but not limited to vaginal bleeding, contractions, leaking of fluid and fetal movement were reviewed in detail with the patient. Please refer to After Visit Summary for other counseling recommendations.   Return in about 2 weeks (around 11/19/2024) for LOB.  Future Appointments  Date Time Provider Department Center  11/10/2024  2:15 PM [facility] PROVIDER 1 [facility]  11/10/2024  2:30 PM [facility] US1 [facility]    Shadai Mcclane Erven) Emilio, MSN, CNM  Center for [facility]  11/05/2024 8:56 AM

## 2024-11-06 LAB — CBC
Hematocrit: 35.8 % (ref 34.0–46.6)
Hemoglobin: 11.4 g/dL (ref 11.1–15.9)
MCH: 28.6 pg (ref 26.6–33.0)
MCHC: 31.8 g/dL (ref 31.5–35.7)
MCV: 90 fL (ref 79–97)
Platelets: 229 x10E3/uL (ref 150–450)
RBC: 3.99 x10E6/uL (ref 3.77–5.28)
RDW: 14.9 % (ref 11.7–15.4)
WBC: 6.3 x10E3/uL (ref 3.4–10.8)

## 2024-11-06 LAB — HIV ANTIBODY (ROUTINE TESTING W REFLEX): HIV Screen 4th Generation wRfx: NONREACTIVE

## 2024-11-06 LAB — GLUCOSE TOLERANCE, 2 HOURS W/ 1HR
Glucose, 1 hour: 153 mg/dL (ref 70–179)
Glucose, 2 hour: 95 mg/dL (ref 70–152)
Glucose, Fasting: 70 mg/dL (ref 70–91)

## 2024-11-06 LAB — SYPHILIS: RPR W/REFLEX TO RPR TITER AND TREPONEMAL ANTIBODIES, TRADITIONAL SCREENING AND DIAGNOSIS ALGORITHM: RPR Ser Ql: NONREACTIVE

## 2024-11-09 ENCOUNTER — Ambulatory Visit: Payer: Self-pay | Admitting: Certified Nurse Midwife

## 2024-11-09 LAB — CERVICOVAGINAL ANCILLARY ONLY
Bacterial Vaginitis (gardnerella): NEGATIVE
Candida Glabrata: NEGATIVE
Candida Vaginitis: NEGATIVE
Comment: NEGATIVE
Comment: NEGATIVE
Comment: NEGATIVE
Comment: NEGATIVE
Trichomonas: NEGATIVE

## 2024-11-10 ENCOUNTER — Ambulatory Visit: Attending: Maternal & Fetal Medicine | Admitting: Maternal & Fetal Medicine

## 2024-11-10 ENCOUNTER — Ambulatory Visit (HOSPITAL_BASED_OUTPATIENT_CLINIC_OR_DEPARTMENT_OTHER)

## 2024-11-10 VITALS — BP 114/69 | HR 109

## 2024-11-10 DIAGNOSIS — O4593 Premature separation of placenta, unspecified, third trimester: Secondary | ICD-10-CM

## 2024-11-10 DIAGNOSIS — Z3A28 28 weeks gestation of pregnancy: Secondary | ICD-10-CM | POA: Diagnosis not present

## 2024-11-10 DIAGNOSIS — N898 Other specified noninflammatory disorders of vagina: Secondary | ICD-10-CM

## 2024-11-10 DIAGNOSIS — O26893 Other specified pregnancy related conditions, third trimester: Secondary | ICD-10-CM | POA: Insufficient documentation

## 2024-11-10 DIAGNOSIS — O4693 Antepartum hemorrhage, unspecified, third trimester: Secondary | ICD-10-CM

## 2024-11-10 DIAGNOSIS — O26892 Other specified pregnancy related conditions, second trimester: Secondary | ICD-10-CM | POA: Diagnosis not present

## 2024-11-10 DIAGNOSIS — Z362 Encounter for other antenatal screening follow-up: Secondary | ICD-10-CM | POA: Insufficient documentation

## 2024-11-10 DIAGNOSIS — O208 Other hemorrhage in early pregnancy: Secondary | ICD-10-CM | POA: Insufficient documentation

## 2024-11-10 DIAGNOSIS — O418X20 Other specified disorders of amniotic fluid and membranes, second trimester, not applicable or unspecified: Secondary | ICD-10-CM

## 2024-11-10 DIAGNOSIS — O358XX0 Maternal care for other (suspected) fetal abnormality and damage, not applicable or unspecified: Secondary | ICD-10-CM

## 2024-11-10 NOTE — Progress Notes (Signed)
 "  Patient information  Patient Name: Stephanie [name] [facility]  Patient MRN:   [id number]  Referring practice: MFM Referring Provider: [facility]  Problem List   Patient Active Problem List   Diagnosis Date Noted   Subchorionic hematoma in second trimester 09/08/2024   Acute knee pain 07/02/2024   Supervision of normal first pregnancy 06/25/2024   Vaginal bleeding in pregnancy, second trimester (resolved at 11 weeks) 06/05/2024   Migraines 08/04/2014   Allergic rhinitis 04/03/2013   MDD (major depressive disorder), single episode, severe (HCC) 01/28/2013    Class: Chronic   Generalized anxiety disorder 01/28/2013    Maternal Fetal medicine Consult  Stephanie [name] is a 28 y.o. G1P0000 at [redacted]w[redacted]d here for ultrasound and consultation. Stephanie [name] is doing well today with no acute concerns. Today we focused on the following:   The patient is here for ultrasound evaluation due to a history of subchorionic hemorrhage earlier in the pregnancy. We reviewed the importance of third-trimester growth assessment to evaluate for any potential placental compromise. Todays ultrasound demonstrates normal fetal growth, with an estimated fetal weight at the 87th percentile. She reports no recurrent vaginal bleeding since early in the pregnancy.  We discussed that given the reassuring growth and absence of ongoing bleeding, no further Maternal-Fetal Medicine follow-up is required at this time unless new indications arise. We also reviewed that a history of subchorionic hemorrhage is a risk factor for placental abruption, and the signs and symptoms of abruption were discussed in detail. Fetal movement precautions were reviewed.  RECOMMENDATIONS -Continue routine prenatal care with primary obstetric provider. -No further MFM follow-up indicated at this time unless new concerns arise. -Monitor for signs and symptoms of placental abruption, including vaginal bleeding,  abdominal pain, or uterine tenderness. -Continue daily fetal movement awareness and present for evaluation with any concerns.  The patient had time to ask questions that were answered to her satisfaction.  She verbalized understanding and agrees to proceed with the plan above.   I spent 20 minutes reviewing the patients chart, including labs and images as well as counseling the patient about her medical conditions. Greater than 50% of the time was spent in direct face-to-face patient counseling.  Stephanie [name]  MFM, [place]   11/10/2024  2:55 PM   Review of Systems: A review of systems was performed and was negative except per HPI   Vitals and Physical Exam    11/10/2024    2:08 PM 11/05/2024    8:19 AM 10/10/2024    4:00 PM  Vitals with BMI  Weight  160 lbs 4 oz   BMI  28.39   Systolic 114 125 885  Diastolic 69 80 61  Pulse 109 105 101    Sitting comfortably on the sonogram table Nonlabored breathing Normal rate and rhythm Abdomen is nontender  Past pregnancies OB History  Gravida Para Term Preterm AB Living  1 0 0 0 0 0  SAB IAB Ectopic Multiple Live Births  0 0 0 0 0    # Outcome Date GA Lbr Len/2nd Weight Sex Type Anes PTL Lv  1 Current             Obstetric Comments  Menstrual age: 40    Age 1st Pregnancy: N/A     Future Appointments  Date Time Provider Department Center  11/24/2024  3:30 PM Herchel Gloris LABOR, MD [facility]  12/09/2024  2:30 PM Emilio Delilah HERO, CNM [facility]  12/23/2024  2:30 PM Emilio Delilah HERO, CNM [facility]  01/06/2025  9:35 AM Letha Renshaw, CNM [facility]  01/12/2025  9:35 AM Fredirick Glenys RAMAN, MD [facility]      "

## 2024-11-16 ENCOUNTER — Other Ambulatory Visit: Payer: Self-pay

## 2024-11-16 ENCOUNTER — Inpatient Hospital Stay (HOSPITAL_COMMUNITY)
Admission: AD | Admit: 2024-11-16 | Discharge: 2024-11-16 | Disposition: A | Discharge status: Home / Self Care | Attending: Obstetrics & Gynecology | Admitting: Obstetrics & Gynecology

## 2024-11-16 ENCOUNTER — Inpatient Hospital Stay (HOSPITAL_COMMUNITY)

## 2024-11-16 DIAGNOSIS — O4703 False labor before 37 completed weeks of gestation, third trimester: Secondary | ICD-10-CM | POA: Insufficient documentation

## 2024-11-16 DIAGNOSIS — Z3A28 28 weeks gestation of pregnancy: Secondary | ICD-10-CM

## 2024-11-16 DIAGNOSIS — B9689 Other specified bacterial agents as the cause of diseases classified elsewhere: Secondary | ICD-10-CM | POA: Insufficient documentation

## 2024-11-16 DIAGNOSIS — O368130 Decreased fetal movements, third trimester, not applicable or unspecified: Secondary | ICD-10-CM | POA: Insufficient documentation

## 2024-11-16 DIAGNOSIS — O4593 Premature separation of placenta, unspecified, third trimester: Secondary | ICD-10-CM | POA: Diagnosis present

## 2024-11-16 DIAGNOSIS — O23593 Infection of other part of genital tract in pregnancy, third trimester: Secondary | ICD-10-CM | POA: Diagnosis not present

## 2024-11-16 DIAGNOSIS — O4693 Antepartum hemorrhage, unspecified, third trimester: Secondary | ICD-10-CM

## 2024-11-16 DIAGNOSIS — Z113 Encounter for screening for infections with a predominantly sexual mode of transmission: Secondary | ICD-10-CM | POA: Diagnosis present

## 2024-11-16 LAB — COMPREHENSIVE METABOLIC PANEL WITH GFR
ALT: 11 U/L (ref 0–44)
AST: 20 U/L (ref 15–41)
Albumin: 3.4 g/dL — ABNORMAL LOW (ref 3.5–5.0)
Alkaline Phosphatase: 56 U/L (ref 38–126)
Anion gap: 8 (ref 5–15)
BUN: 8 mg/dL (ref 6–20)
CO2: 23 mmol/L (ref 22–32)
Calcium: 8.7 mg/dL — ABNORMAL LOW (ref 8.9–10.3)
Chloride: 103 mmol/L (ref 98–111)
Creatinine, Ser: 0.61 mg/dL (ref 0.44–1.00)
GFR, Estimated: >60 mL/min
Glucose, Bld: 121 mg/dL — ABNORMAL HIGH (ref 70–99)
Potassium: 3.8 mmol/L (ref 3.5–5.1)
Sodium: 134 mmol/L — ABNORMAL LOW (ref 135–145)
Total Bilirubin: <0.2 mg/dL (ref 0.0–1.2)
Total Protein: 6.3 g/dL — ABNORMAL LOW (ref 6.5–8.1)

## 2024-11-16 LAB — CBC
HCT: 34.5 % — ABNORMAL LOW (ref 36.0–46.0)
Hemoglobin: 11.3 g/dL — ABNORMAL LOW (ref 12.0–15.0)
MCH: 29.2 pg (ref 26.0–34.0)
MCHC: 32.8 g/dL (ref 30.0–36.0)
MCV: 89.1 fL (ref 80.0–100.0)
Platelets: 230 K/uL (ref 150–400)
RBC: 3.87 MIL/uL (ref 3.87–5.11)
RDW: 14.8 % (ref 11.5–15.5)
WBC: 6.4 K/uL (ref 4.0–10.5)
nRBC: 0 % (ref 0.0–0.2)

## 2024-11-16 LAB — WET PREP, GENITAL
Sperm: NONE SEEN
Trich, Wet Prep: NONE SEEN
WBC, Wet Prep HPF POC: >=10 — AB
Yeast Wet Prep HPF POC: NONE SEEN

## 2024-11-16 LAB — LIPASE, BLOOD: Lipase: 24 U/L (ref 11–51)

## 2024-11-16 MED ORDER — METRONIDAZOLE 0.75 % VA GEL: 1.0000 | Freq: Every day | VAGINAL | 1 refills | Status: DC

## 2024-11-16 NOTE — Discharge Instructions (Signed)
 You came into the hospital because you had an episode of bleeding this afternoon.  We did an ultrasound which did not show signs of placental abruption or previa.  You did have a previous ultrasound which showed a subchorionic hemorrhage-I included some information about this.  You are having some right sided abdominal pain-we did labs that were normal.  We also did vaginal swabs, which showed bacterial vaginosis.  We talked about different options for treatment-I have sent MetroGel  to your pharmacy.  You will insert 1 applicatorful into the vagina for the next 5 nights.  Make sure you wear a panty liner when you go to sleep as some of the medicine will come back out.  Please follow-up with your OB as scheduled next week.  I hope you have a happy and safe holiday.

## 2024-11-16 NOTE — MAU Provider Note (Signed)
 Chief Complaint:  Vaginal Bleeding   HPI   Event Date/Time   First Provider Initiated Contact with Patient 11/16/24 1907      Stephanie [name] is a 28 y.o. G1P0000 at [redacted]w[redacted]d who presents to maternity admissions reporting multiple concerns. Was using the restroom around 6p and noted bright red blood similar to a period. Notes blood dripping into the toilet as well. Did put on a pad prior to coming to the hospital. Pad has been dry since episode of bleeding.  Today , she went to work (sitting most of the day) and the grocery store. Notes that over the weekend she was fairly active. Darol Irving since Saturday, more intense today. Denies any other episodes similar to this. Decreased fetal movement today, has not done kick counts today. Denies leakage of fluid.   Pregnancy Course: Follows at [facility] for her pregnancy. Pregnancy has been uncomplicated.   Past Medical History:  Diagnosis Date   Acute pancreatitis without infection or necrosis 04/21/2019   Anemia    Anxiety 01/28/2013   no meds   Axillary hidradenitis suppurativa 03/05/2019   Celiac disease    Depression    no meds   Endometriosis    Hidradenitis    IBS (irritable bowel syndrome)    Migraines    HA every other day - otc med prn   Recurrent streptococcal tonsillitis    Seasonal allergies    Seasonal environmental allergies   Sebaceous cyst 08/04/2018   L mid back    Vision abnormalities    Myopia; patient has history of devloping ulcers when she wore contacts.   OB History  Gravida Para Term Preterm AB Living  1 0 0 0 0 0  SAB IAB Ectopic Multiple Live Births  0 0 0 0 0    # Outcome Date GA Lbr Len/2nd Weight Sex Type Anes PTL Lv  1 Current             Obstetric Comments  Menstrual age: 4    Age 1st Pregnancy: N/A   Past Surgical History:  Procedure Laterality Date   COLONOSCOPY     IBS   COLONOSCOPY     IBS   IRRIGATION AND DEBRIDEMENT ABSCESS Right 12/07/2021   Procedure: IRRIGATION AND  DEBRIDEMENT AXILLARY ABSCESS;  Surgeon: Rubin Calamity, MD;  Location: [facility] OR;  Service: General;  Laterality: Right;   LAPAROSCOPY N/A 10/20/2018   Procedure: LAPAROSCOPY DIAGNOSTIC possible removal of endometriosis;  Surgeon: Dannielle Bouchard, DO;  Location: [facility]S;  Service: Gynecology;  Laterality: N/A;   UPPER GASTROINTESTINAL ENDOSCOPY  03/04/2020   Family History  Problem Relation Age of Onset   Depression Mother    Migraines Mother    Irritable bowel syndrome Mother    Hypertension Mother    Migraines Father    Diabetes Father    Diabetes Maternal Grandmother    Hypertension Maternal Grandmother    Diabetes Sister    Stomach cancer Sister 62   Colon cancer Neg Hx    Esophageal cancer Neg Hx    Rectal cancer Neg Hx    Social History[1] Allergies[2] Medications Prior to Admission  Medication Sig Dispense Refill Last Dose/Taking   acetaminophen  (TYLENOL ) 500 MG tablet Take 1,000 mg by mouth every 6 (six) hours as needed.      acetaminophen -caffeine  (EXCEDRIN TENSION HEADACHE) 500-65 MG TABS per tablet Take 2 tablets by mouth 4 (four) times daily as needed (Foe headaches). (Patient not taking: Reported on 11/05/2024) 60 tablet 0  aspirin  EC 81 MG tablet Take 1 tablet (81 mg total) by mouth at bedtime. Start taking when you are [redacted] weeks pregnant for rest of pregnancy for prevention of preeclampsia 300 tablet 2    docusate sodium  (COLACE) 100 MG capsule Take 100 mg by mouth daily as needed for mild constipation. (Patient not taking: Reported on 09/10/2024)      EPINEPHrine  0.3 mg/0.3 mL IJ SOAJ injection Inject 0.3 mg into the muscle as needed for anaphylaxis. (Patient not taking: Reported on 09/10/2024) 1 each 0    ferrous sulfate  325 (65 FE) MG tablet Take 1 tablet (325 mg total) by mouth daily. (Patient not taking: Reported on 10/08/2024) 30 tablet 0    metroNIDAZOLE  (FLAGYL ) 500 MG tablet Take 1 tablet (500 mg total) by mouth 2 (two) times daily. 14 tablet 0     nystatin -triamcinolone  (MYCOLOG II) cream Apply  externally to affected area daily prn 30 g 0    ondansetron  (ZOFRAN -ODT) 8 MG disintegrating tablet Take 1 tablet (8 mg total) by mouth every 8 (eight) hours as needed for nausea or vomiting. (Patient not taking: Reported on 09/10/2024) 30 tablet 1    ondansetron  (ZOFRAN -ODT) 8 MG disintegrating tablet Take 0.5 tablets (4 mg total) by mouth every 8 (eight) hours as needed for nausea or vomiting. (Patient not taking: Reported on 09/10/2024) 30 tablet 1    Prenatal Vit-Fe Fumarate-FA (PRENATAL MULTIVITAMIN) TABS tablet Take 1 tablet by mouth daily at 12 noon.      promethazine  (PHENERGAN ) 25 MG tablet Take 1 tablet (25 mg total) by mouth every 6 (six) hours as needed for nausea or vomiting. (Patient not taking: Reported on 09/10/2024) 30 tablet 2     I have reviewed patient's Past Medical Hx, Surgical Hx, Family Hx, Social Hx, medications and allergies.   ROS  Pertinent items noted in HPI and remainder of comprehensive ROS otherwise negative.   PHYSICAL EXAM  Patient Vitals for the past 24 hrs:  BP Temp Pulse Resp SpO2 Weight  11/16/24 1837 129/74 99.3 F (37.4 C) (!) 112 12 100 % 71.7 kg    Constitutional: Well-developed, well-nourished female in no acute distress.  Cardiovascular: Warm and well-perfused Respiratory: normal effort, no problems with respiration noted GI: Abd soft, tender to palpation on right lateral aspect of abdomen, gravid MS: Extremities nontender, no edema, normal ROM Neurologic: Alert and oriented x 4.  Pelvic: normal external female genitalia, physiologic discharge, brown discharge noted, cervix clean.   Dilation: 1 Effacement (%): Thick Station: Ballotable Exam by:: Dr. Jomarie  Fetal Tracing: Baseline:150bpm Variability: Moderate Accelerations: Present Decelerations: Absent Toco: Flat   Labs: Results for orders placed or performed during the hospital encounter of 11/16/24 (from the past 24 hours)   Comprehensive metabolic panel with GFR     Status: Abnormal   Collection Time: 11/16/24  7:26 PM  Result Value Ref Range   Sodium 134 (L) 135 - 145 mmol/L   Potassium 3.8 3.5 - 5.1 mmol/L   Chloride 103 98 - 111 mmol/L   CO2 23 22 - 32 mmol/L   Glucose, Bld 121 (H) 70 - 99 mg/dL   BUN 8 6 - 20 mg/dL   Creatinine, Ser 9.38 0.44 - 1.00 mg/dL   Calcium 8.7 (L) 8.9 - 10.3 mg/dL   Total Protein 6.3 (L) 6.5 - 8.1 g/dL   Albumin 3.4 (L) 3.5 - 5.0 g/dL   AST 20 15 - 41 U/L   ALT 11 0 - 44 U/L   Alkaline  Phosphatase 56 38 - 126 U/L   Total Bilirubin <0.2 0.0 - 1.2 mg/dL   GFR, Estimated >39 >39 mL/min   Anion gap 8 5 - 15  Lipase, blood     Status: None   Collection Time: 11/16/24  7:26 PM  Result Value Ref Range   Lipase 24 11 - 51 U/L  CBC     Status: Abnormal   Collection Time: 11/16/24  7:26 PM  Result Value Ref Range   WBC 6.4 4.0 - 10.5 K/uL   RBC 3.87 3.87 - 5.11 MIL/uL   Hemoglobin 11.3 (L) 12.0 - 15.0 g/dL   HCT 65.4 (L) 63.9 - 53.9 %   MCV 89.1 80.0 - 100.0 fL   MCH 29.2 26.0 - 34.0 pg   MCHC 32.8 30.0 - 36.0 g/dL   RDW 85.1 88.4 - 84.4 %   Platelets 230 150 - 400 K/uL   nRBC 0.0 0.0 - 0.2 %  Wet prep, genital     Status: Abnormal   Collection Time: 11/16/24  9:03 PM   Specimen: PATH Cytology Cervicovaginal Ancillary Only  Result Value Ref Range   Yeast Wet Prep HPF POC NONE SEEN NONE SEEN   Trich, Wet Prep NONE SEEN NONE SEEN   Clue Cells Wet Prep HPF POC PRESENT (A) NONE SEEN   WBC, Wet Prep HPF POC >=10 (A) <10   Sperm NONE SEEN     Imaging:  No results found.  MDM & MAU COURSE  MDM: Moderate  MAU Course: Orders Placed This Encounter  Procedures   Wet prep, genital   US  MFM OB LIMITED   Comprehensive metabolic panel with GFR   Lipase, blood   CBC   Discharge patient   Meds ordered this encounter  Medications   metroNIDAZOLE  (METROGEL ) 0.75 % vaginal gel    Sig: Place 1 Applicatorful vaginally at bedtime. Apply one applicatorful to vagina at  bedtime for 5 days    Dispense:  70 g    Refill:  1    9p - Ultrasound concerned that she was unable to visualize cervix on exam. Speculum exam showing cervix slightly dilate with brown discharge in vaginal vault. Swabs collected. SVE 1/thick/ballotable.  9:30 PM-ultrasound preliminary read negative for placental abruption or previa.  Wet prep showing clue cells, concerning for bacterial vaginosis.  CBC, CMP, and lipase unremarkable.  Discussed results with patient.  Will send metronidazole  to preferred pharmacy.  All questions answered prior to discharge.   ASSESSMENT   1. [redacted] weeks gestation of pregnancy   2. Bacterial vaginosis     PLAN  Discharge home in stable condition with return precautions.  Follow-up with OB as scheduled.   Allergies as of 11/16/2024       Reactions   Almond (diagnostic) Other (See Comments)   Unknown all tree nuts    Coconut Flavoring Agent (non-screening) Other (See Comments)   Mouth itches, but no impaired breathing (per patient   Gluten Meal    Septra  [sulfamethoxazole -trimethoprim ] Nausea Only, Other (See Comments)   Patient took a few courses of this and it ended up being stopped by a MD because it had started causing bad stomach pain (per the patient)        Medication List     STOP taking these medications    metroNIDAZOLE  500 MG tablet Commonly known as: FLAGYL        TAKE these medications    acetaminophen  500 MG tablet Commonly known as: TYLENOL  Take 1,000 mg  by mouth every 6 (six) hours as needed.   acetaminophen -caffeine  500-65 MG Tabs per tablet Commonly known as: EXCEDRIN TENSION HEADACHE Take 2 tablets by mouth 4 (four) times daily as needed (Foe headaches).   aspirin  EC 81 MG tablet Take 1 tablet (81 mg total) by mouth at bedtime. Start taking when you are [redacted] weeks pregnant for rest of pregnancy for prevention of preeclampsia   docusate sodium  100 MG capsule Commonly known as: COLACE Take 100 mg by mouth daily as  needed for mild constipation.   EPINEPHrine  0.3 mg/0.3 mL Soaj injection Commonly known as: EPI-PEN Inject 0.3 mg into the muscle as needed for anaphylaxis.   ferrous sulfate  325 (65 FE) MG tablet Take 1 tablet (325 mg total) by mouth daily.   metroNIDAZOLE  0.75 % vaginal gel Commonly known as: METROGEL  Place 1 Applicatorful vaginally at bedtime. Apply one applicatorful to vagina at bedtime for 5 days   nystatin -triamcinolone  cream Commonly known as: MYCOLOG II Apply  externally to affected area daily prn   ondansetron  8 MG disintegrating tablet Commonly known as: ZOFRAN -ODT Take 1 tablet (8 mg total) by mouth every 8 (eight) hours as needed for nausea or vomiting.   ondansetron  8 MG disintegrating tablet Commonly known as: ZOFRAN -ODT Take 0.5 tablets (4 mg total) by mouth every 8 (eight) hours as needed for nausea or vomiting.   prenatal multivitamin Tabs tablet Take 1 tablet by mouth daily at 12 noon.   promethazine  25 MG tablet Commonly known as: PHENERGAN  Take 1 tablet (25 mg total) by mouth every 6 (six) hours as needed for nausea or vomiting.        Charlie Courts, MD  Family Medicine - Obstetrics Fellow        [1]  Social History Tobacco Use   Smoking status: Never   Smokeless tobacco: Never  Vaping Use   Vaping status: Never Used  Substance Use Topics   Alcohol use: Never    Comment: socially   Drug use: Not Currently  [2]  Allergies Allergen Reactions   Almond (Diagnostic) Other (See Comments)    Unknown all tree nuts    Coconut Flavoring Agent (Non-Screening) Other (See Comments)    Mouth itches, but no impaired breathing (per patient   Gluten Meal    Septra  [Sulfamethoxazole -Trimethoprim ] Nausea Only and Other (See Comments)    Patient took a few courses of this and it ended up being stopped by a MD because it had started causing bad stomach pain (per the patient)

## 2024-11-16 NOTE — MAU Note (Addendum)
.  Stephanie [name] is a 28 y.o. at [redacted]w[redacted]d here in MAU reporting: was told she needed to be on look out any bleeding. Has not felt normal fetal movement today patient reports clear fluid in her underwear around 1100  Onset of complaint:  Pain score: denies  There were no vitals filed for this visit.   FHT:(will get on monitor) Lab orders placed from triage:   ua

## 2024-11-17 LAB — GC/CHLAMYDIA PROBE AMP (~~LOC~~) NOT AT ARMC
Chlamydia: NEGATIVE
Comment: NEGATIVE
Comment: NORMAL
Neisseria Gonorrhea: NEGATIVE

## 2024-11-24 ENCOUNTER — Ambulatory Visit (INDEPENDENT_AMBULATORY_CARE_PROVIDER_SITE_OTHER): Admitting: Obstetrics & Gynecology

## 2024-11-24 ENCOUNTER — Other Ambulatory Visit (HOSPITAL_COMMUNITY)
Admission: RE | Admit: 2024-11-24 | Discharge: 2024-11-24 | Disposition: A | Discharge status: Home / Self Care | Source: Ambulatory Visit | Attending: Obstetrics & Gynecology | Admitting: Obstetrics & Gynecology

## 2024-11-24 VITALS — BP 113/77 | HR 108 | Wt 161.0 lb

## 2024-11-24 DIAGNOSIS — N898 Other specified noninflammatory disorders of vagina: Secondary | ICD-10-CM | POA: Diagnosis not present

## 2024-11-24 DIAGNOSIS — O4703 False labor before 37 completed weeks of gestation, third trimester: Secondary | ICD-10-CM | POA: Diagnosis not present

## 2024-11-24 DIAGNOSIS — O26893 Other specified pregnancy related conditions, third trimester: Secondary | ICD-10-CM | POA: Diagnosis not present

## 2024-11-24 DIAGNOSIS — Z3A30 30 weeks gestation of pregnancy: Secondary | ICD-10-CM

## 2024-11-24 DIAGNOSIS — Z3403 Encounter for supervision of normal first pregnancy, third trimester: Secondary | ICD-10-CM

## 2024-11-24 NOTE — Patient Instructions (Signed)
 Return to office for any scheduled appointments. Call the office or go to the MAU at [facility] if: You begin to have strong, frequent contractions Your water breaks.  Sometimes it is a big gush of fluid, sometimes it is just a trickle that keeps getting your underwear wet or running down your legs You have vaginal bleeding.  It is normal to have a small amount of spotting if your cervix was checked.  You do not feel your baby moving like normal.  If you do not, get something to eat and drink and lay down and focus on feeling your baby move.   If your baby is still not moving like normal, you should call the office or go to MAU. Any other obstetric concerns.     RSV Vaccination for Pregnant People  CDC recommends two ways to protect babies from getting very sick with Respiratory Syncytial Virus (RSV):  An RSV vaccination given during pregnancy  Pfizer's vaccine Verdis Frederickson) is recommended for use during pregnancy. It is given during RSV season to people who are 32 through [redacted] weeks pregnant.  Or, An RSV immunization given directly to infants and some older babies  Babies born to mothers who get RSV vaccine at least 2 weeks before delivery will have protection and, in most cases, should not need an RSV immunization later.    When is RSV season?  In most regions of the [place] RSV season starts in the fall and peaks in the winter, but the timing and severity of RSV season can vary from place to place and year to year.   The goal of maternal RSV vaccination is to protect babies from getting very sick with RSV during their first RSV season.  In most of the [place], this means maternal RSV vaccine will be given in September through January.  Who should get the maternal RSV vaccine?  People who are 79 through [redacted] weeks pregnant during September through January should get one dose of maternal RSV vaccine to protect their babies. RSV season  can vary around the country.   How is the maternal RSV vaccine administered?  Maternal RSV vaccine is given as a shot into the mother's upper arm. Only a single dose (one shot) of maternal RSV vaccine is recommended.   It is not yet known whether another dose might be needed in later pregnancies.  How well does the maternal RSV vaccine work?  When someone gets RSV vaccine, their body responds by making a protein that protects against the virus that causes RSV. The process takes about 2 weeks. When a pregnant person gets RSV vaccine, their protective proteins (called antibodies) also pass to their baby. So, babies who are born at least 2 weeks after their mother gets RSV vaccine are protected at birth, when infants are at the highest risk of severe RSV disease.   The vaccine can reduce a baby's risk of being hospitalized from RSV by 57% in the first six months after birth.  What are the possible side effects of the maternal RSV vaccine?  In the clinical trials, the side effects most often reported by pregnant people who received the maternal RSV vaccine were pain at the injection site, headache, muscle pain, and nausea.  Although not common, a dangerous high blood pressure condition called pre-eclampsia occurred in 1.8% of pregnant people who received the maternal RSV vaccine compared to 1.4% of pregnant people who received a placebo.  The clinical  trials identified a small increase in the number of preterm births in vaccinated pregnant people. It is not clear if this is a true safety problem related to RSV vaccine or if this occurred for reasons unrelated to vaccination.  To reduce the potential risk of preterm birth and complications from RSV disease, FDA approved the maternal RSV vaccine for use during weeks 32 through 30 of pregnancy while additional studies are conducted.  FDA is requiring the manufacturer to do additional studies that will look more closely at the potential risk of  preterm births and pregnancy-related high blood pressure issues in mothers, including pre-eclampsia.  Severe allergic reactions to vaccines are rare but can happen after any vaccine and can be life-threatening. If you see signs of a severe allergic reaction after vaccination (hives, swelling of the face and throat, difficulty breathing, a fast heartbeat, dizziness, or weakness), seek immediate medical care by calling 911.  As with any medicine or vaccine there is a very remote chance of the vaccine causing other serious injury or death after vaccination.  Adverse events following vaccination should be reported to the Vaccine Adverse Event Reporting System (VAERS), even if it's not clear that the vaccine caused the adverse event. You or your doctor can report an adverse event to [employer] and FDA through VAERS. If you need further assistance reporting to VAERS, please email [email or web address] [email or web address] or call [phone].  If you have any questions about side effects from the maternal RSV vaccine, talk with your healthcare provider.  Do I need a prescription for a maternal RSV vaccine?  Until the vaccine available in the office, you will need a prescription to take to a local pharmacy that is providing the vaccine.   How do I pay for the maternal RSV vaccine?  Most private health insurance plans cover the maternal RSV vaccine, but there may be a cost to you depending on your plan.  Contact your insurer to find out.  Medicaid Beginning August 19, 2022, most people with coverage from [facility] and [employer] [facility]) will be guaranteed coverage of all vaccines recommended by the Advisory Committee on Immunization Practice at no cost to them.   Source: [facility] for Immunization and Respiratory Diseases

## 2024-11-24 NOTE — Progress Notes (Signed)
 "  PRENATAL VISIT NOTE  Subjective:  Stephanie [name] is a 29 y.o. G1P0000 at [redacted]w[redacted]d being seen today for ongoing prenatal care.  She is currently monitored for the following issues for this low-risk pregnancy and has MDD (major depressive disorder), single episode, severe (HCC); Generalized anxiety disorder; Allergic rhinitis; Migraines; Vaginal bleeding in pregnancy, second trimester (resolved at 11 weeks); Supervision of normal first pregnancy; Acute knee pain; and Subchorionic hematoma in second trimester on their problem list.  Patient reports preterm contractions coming every 3-5 minutes for the last hour, possible pink discharge.  Contractions: Irritability. Vag. Bleeding: Scant.  Movement: Present. Denies leaking of fluid.   The following portions of the patient's history were reviewed and updated as appropriate: allergies, current medications, past family history, past medical history, past social history, past surgical history and problem list.   Objective:   Vitals:   11/24/24 1547  BP: 113/77  Pulse: (!) 108  Weight: 161 lb (73 kg)    Fetal Status:  Fetal Heart Rate (bpm): 155 Fundal Height: 30 cm Movement: Present    General: Alert, oriented and cooperative. Patient is in no acute distress.  Skin: Skin is warm and dry. No rash noted.   Cardiovascular: Normal heart rate noted  Respiratory: Normal respiratory effort, no problems with respiration noted  Abdomen: Soft, gravid, appropriate for gestational age.  Pain/Pressure: Present     Pelvic: Sterile exam performed in the presence of a chaperone Edwardo Sink, [facility]) Dilation: Closed Effacement (%): Thick Station: Ballotable.  Thick, white discharge noted, testing sample obtained. No bleeding or pink discharge noted.  Extremities: Normal range of motion.  Edema: Trace  Mental Status: Normal mood and affect. Normal behavior. Normal judgment and thought content.      07/08/2024    3:25 PM 05/28/2024   11:11 AM 02/24/2024     2:56 PM  Depression screen PHQ 2/9  Decreased Interest 0 1 0  Down, Depressed, Hopeless 0 1 0  PHQ - 2 Score 0 2 0  Altered sleeping 0 2   Tired, decreased energy 1 1   Change in appetite 0 0   Feeling bad or failure about yourself  0 0   Trouble concentrating 0 2   Moving slowly or fidgety/restless 0 0   Suicidal thoughts 0 0   PHQ-9 Score 1  7    Difficult doing work/chores Not difficult at all Not difficult at all      Data saved with a previous flowsheet row definition        07/08/2024    3:26 PM 12/25/2023   11:25 AM 05/02/2023    2:49 PM 08/02/2021    8:20 AM  GAD 7 : Generalized Anxiety Score  Nervous, Anxious, on Edge 2 1 2 3   Control/stop worrying 1 1 1 3   Worry too much - different things 1 1 1 3   Trouble relaxing 1 1 1 1   Restless 0 1 1 2   Easily annoyed or irritable 1 2 1 3   Afraid - awful might happen 0 1 0 2  Total GAD 7 Score 6 8 7 17   Anxiety Difficulty Not difficult at all Not difficult at all Not difficult at all Very difficult    Assessment and Plan:  Pregnancy: G1P0000 at [redacted]w[redacted]d 1. Preterm uterine contractions in third trimester, antepartum (Primary) Reassured by closed cervix. Recommended adequate hydration. Told to go MAU if symptoms worsen.  2. Vaginal discharge during pregnancy in third trimester - Cervicovaginal ancillary only( [facility])  3. [redacted] weeks gestation of pregnancy 4. Encounter for supervision of normal first pregnancy in third trimester Preterm labor symptoms and general obstetric precautions including but not limited to vaginal bleeding, contractions, leaking of fluid and fetal movement were reviewed in detail with the patient. Please refer to After Visit Summary for other counseling recommendations.   Return in about 2 weeks (around 12/08/2024) for OFFICE OB VISIT (MD only).  Future Appointments  Date Time Provider Department Center  12/09/2024  2:30 PM Emilio Delilah HERO, [place] [facility]  12/23/2024  2:30 PM Emilio Delilah HERO, CNM [facility]  01/06/2025  9:35 AM Letha Renshaw, CNM [facility]  01/12/2025  9:35 AM Fredirick Glenys RAMAN, MD [facility]    Gloris Hugger, MD  "

## 2024-12-02 ENCOUNTER — Ambulatory Visit: Payer: Self-pay | Admitting: Obstetrics & Gynecology

## 2024-12-02 LAB — CERVICOVAGINAL ANCILLARY ONLY
Bacterial Vaginitis (gardnerella): NEGATIVE
Chlamydia: NEGATIVE
Comment: NEGATIVE
Comment: NEGATIVE
Comment: NEGATIVE
Comment: NEGATIVE
Comment: NEGATIVE
Comment: NORMAL
Neisseria Gonorrhea: NEGATIVE

## 2024-12-03 ENCOUNTER — Other Ambulatory Visit: Payer: Self-pay

## 2024-12-03 ENCOUNTER — Inpatient Hospital Stay (HOSPITAL_COMMUNITY)
Admission: AD | Admit: 2024-12-03 | Discharge: 2024-12-03 | Disposition: A | Discharge status: Home / Self Care | Attending: Obstetrics and Gynecology | Admitting: Obstetrics and Gynecology

## 2024-12-03 DIAGNOSIS — O4703 False labor before 37 completed weeks of gestation, third trimester: Secondary | ICD-10-CM | POA: Insufficient documentation

## 2024-12-03 DIAGNOSIS — Z3403 Encounter for supervision of normal first pregnancy, third trimester: Secondary | ICD-10-CM

## 2024-12-03 DIAGNOSIS — Z3A31 31 weeks gestation of pregnancy: Secondary | ICD-10-CM | POA: Diagnosis not present

## 2024-12-03 DIAGNOSIS — O09899 Supervision of other high risk pregnancies, unspecified trimester: Secondary | ICD-10-CM

## 2024-12-03 LAB — URINALYSIS, ROUTINE W REFLEX MICROSCOPIC
Bilirubin Urine: NEGATIVE
Glucose, UA: NEGATIVE mg/dL
Hgb urine dipstick: NEGATIVE
Ketones, ur: NEGATIVE mg/dL
Nitrite: NEGATIVE
Protein, ur: NEGATIVE mg/dL
Specific Gravity, Urine: 1.018 (ref 1.005–1.030)
pH: 7 (ref 5.0–8.0)

## 2024-12-03 LAB — WET PREP, GENITAL
Sperm: NONE SEEN
Trich, Wet Prep: NONE SEEN
WBC, Wet Prep HPF POC: >=10 — AB
Yeast Wet Prep HPF POC: NONE SEEN

## 2024-12-03 LAB — FETAL FIBRONECTIN: Fetal Fibronectin: POSITIVE — AB

## 2024-12-03 MED ORDER — ACETAMINOPHEN 500 MG PO TABS
1000.0000 mg | ORAL_TABLET | Freq: Once | ORAL | Status: AC
  Administered 2024-12-03: 1000 mg via ORAL
  Filled 2024-12-03: qty 2

## 2024-12-03 MED ORDER — BETAMETHASONE SOD PHOS & ACET 6 (3-3) MG/ML IJ SUSP
12.0000 mg | Freq: Once | INTRAMUSCULAR | Status: AC
  Administered 2024-12-03: 12 mg via INTRAMUSCULAR
  Filled 2024-12-03: qty 5

## 2024-12-03 MED ORDER — NIFEDIPINE 10 MG PO CAPS: 10.0000 mg | ORAL_CAPSULE | ORAL | 1 refills | Status: DC | PRN

## 2024-12-03 NOTE — MAU Provider Note (Signed)
 Chief Complaint:  Contractions   HPI   Event Date/Time   First Provider Initiated Contact with Patient 12/03/24 1308      Stephanie [name] is a 29 y.o. G1P0000 at [redacted]w[redacted]d who presents to maternity admissions reporting intermittent sharp pains in pelvis and Braxton-Hicks contractions worsening overnight. Sharp pains started last night around 7pm, was on her feet for an extended period of time and pains started about 2 hours later. Describes as random pain that starts in the pelvis and radiates into the vagina. Not provoked by movement, happens at random times. Braxton-Hicks have been happening for a while but got worse last night, woke her up out of her sleep, happened twice. Counted 9 over the past five hours.  Denies vaginal bleeding, did have an episode of clear vaginal discharge while at work. Denies decreased fetal movement.   Pregnancy Course: Follows at [facility] for prenatal care. Pregnancy is uncomplicated.  Past Medical History:  Diagnosis Date   Acute pancreatitis without infection or necrosis 04/21/2019   Anemia    Anxiety 01/28/2013   no meds   Axillary hidradenitis suppurativa 03/05/2019   Celiac disease    Depression    no meds   Endometriosis    Hidradenitis    IBS (irritable bowel syndrome)    Migraines    HA every other day - otc med prn   Recurrent streptococcal tonsillitis    Seasonal allergies    Seasonal environmental allergies   Sebaceous cyst 08/04/2018   L mid back    Vision abnormalities    Myopia; patient has history of devloping ulcers when she wore contacts.   OB History  Gravida Para Term Preterm AB Living  1 0 0 0 0 0  SAB IAB Ectopic Multiple Live Births  0 0 0 0 0    # Outcome Date GA Lbr Len/2nd Weight Sex Type Anes PTL Lv  1 Current             Obstetric Comments  Menstrual age: 74    Age 1st Pregnancy: N/A   Past Surgical History:  Procedure Laterality Date   COLONOSCOPY     IBS   COLONOSCOPY     IBS   IRRIGATION AND  DEBRIDEMENT ABSCESS Right 12/07/2021   Procedure: IRRIGATION AND DEBRIDEMENT AXILLARY ABSCESS;  Surgeon: Rubin Calamity, MD;  Location: [facility] OR;  Service: General;  Laterality: Right;   LAPAROSCOPY N/A 10/20/2018   Procedure: LAPAROSCOPY DIAGNOSTIC possible removal of endometriosis;  Surgeon: Dannielle Bouchard, DO;  Location: [facility];  Service: Gynecology;  Laterality: N/A;   UPPER GASTROINTESTINAL ENDOSCOPY  03/04/2020   Family History  Problem Relation Age of Onset   Depression Mother    Migraines Mother    Irritable bowel syndrome Mother    Hypertension Mother    Migraines Father    Diabetes Father    Diabetes Maternal Grandmother    Hypertension Maternal Grandmother    Diabetes Sister    Stomach cancer Sister 8   Colon cancer Neg Hx    Esophageal cancer Neg Hx    Rectal cancer Neg Hx    Social History[1] Allergies[2] Medications Prior to Admission  Medication Sig Dispense Refill Last Dose/Taking   aspirin  EC 81 MG tablet Take 1 tablet (81 mg total) by mouth at bedtime. Start taking when you are [redacted] weeks pregnant for rest of pregnancy for prevention of preeclampsia 300 tablet 2 12/02/2024   Prenatal Vit-Fe Fumarate-FA (PRENATAL MULTIVITAMIN) TABS tablet Take 1 tablet by mouth  daily at 12 noon.   12/02/2024   acetaminophen  (TYLENOL ) 500 MG tablet Take 1,000 mg by mouth every 6 (six) hours as needed. (Patient not taking: Reported on 11/24/2024)      acetaminophen -caffeine  (EXCEDRIN TENSION HEADACHE) 500-65 MG TABS per tablet Take 2 tablets by mouth 4 (four) times daily as needed (Foe headaches). (Patient not taking: Reported on 11/24/2024) 60 tablet 0    docusate sodium  (COLACE) 100 MG capsule Take 100 mg by mouth daily as needed for mild constipation. (Patient not taking: Reported on 11/24/2024)      EPINEPHrine  0.3 mg/0.3 mL IJ SOAJ injection Inject 0.3 mg into the muscle as needed for anaphylaxis. (Patient not taking: Reported on 09/10/2024) 1 each 0    ferrous sulfate  325 (65 FE) MG tablet  Take 1 tablet (325 mg total) by mouth daily. (Patient not taking: Reported on 10/08/2024) 30 tablet 0    metroNIDAZOLE  (METROGEL ) 0.75 % vaginal gel Place 1 Applicatorful vaginally at bedtime. Apply one applicatorful to vagina at bedtime for 5 days (Patient not taking: Reported on 11/24/2024) 70 g 1    nystatin -triamcinolone  (MYCOLOG II) cream Apply  externally to affected area daily prn (Patient not taking: Reported on 11/24/2024) 30 g 0    ondansetron  (ZOFRAN -ODT) 8 MG disintegrating tablet Take 1 tablet (8 mg total) by mouth every 8 (eight) hours as needed for nausea or vomiting. (Patient not taking: Reported on 11/24/2024) 30 tablet 1    ondansetron  (ZOFRAN -ODT) 8 MG disintegrating tablet Take 0.5 tablets (4 mg total) by mouth every 8 (eight) hours as needed for nausea or vomiting. (Patient not taking: Reported on 11/24/2024) 30 tablet 1    promethazine  (PHENERGAN ) 25 MG tablet Take 1 tablet (25 mg total) by mouth every 6 (six) hours as needed for nausea or vomiting. (Patient not taking: Reported on 11/24/2024) 30 tablet 2     I have reviewed patient's Past Medical Hx, Surgical Hx, Family Hx, Social Hx, medications and allergies.   ROS  Pertinent items noted in HPI and remainder of comprehensive ROS otherwise negative.   PHYSICAL EXAM  Patient Vitals for the past 24 hrs:  BP Temp Temp src Pulse Resp SpO2 Height Weight  12/03/24 1239 114/69 98.3 F (36.8 C) Oral (!) 104 18 100 % 5' 3 (1.6 m) 72.6 kg    Constitutional: Well-developed, well-nourished female in no acute distress.  Cardiovascular: Warm and well-perfused Respiratory: normal effort, no problems with respiration noted GI: Abd soft, non-tender, gravid MS: Extremities nontender, no edema, normal ROM Neurologic: Alert and oriented x 4.  Pelvic: normal external female genitalia, thick yellow discharge, no blood, cervix clean.   Dilation: 1 Effacement (%): Thick Exam by:: Jomarie, MD  Fetal Tracing: Baseline: 140bpm Variability:  Moderate Accelerations: Present Decelerations: Absent  Toco: ctx q4-76min with intermittent irritability   Labs: Results for orders placed or performed during the hospital encounter of 12/03/24 (from the past 24 hours)  Urinalysis, Routine w reflex microscopic -Urine, Clean Catch     Status: Abnormal   Collection Time: 12/03/24  1:08 PM  Result Value Ref Range   Color, Urine YELLOW YELLOW   APPearance HAZY (A) CLEAR   Specific Gravity, Urine 1.018 1.005 - 1.030   pH 7.0 5.0 - 8.0   Glucose, UA NEGATIVE NEGATIVE mg/dL   Hgb urine dipstick NEGATIVE NEGATIVE   Bilirubin Urine NEGATIVE NEGATIVE   Ketones, ur NEGATIVE NEGATIVE mg/dL   Protein, ur NEGATIVE NEGATIVE mg/dL   Nitrite NEGATIVE NEGATIVE   Leukocytes,Ua MODERATE (  A) NEGATIVE   RBC / HPF 0-5 0 - 5 RBC/hpf   WBC, UA 0-5 0 - 5 WBC/hpf   Bacteria, UA RARE (A) NONE SEEN   Squamous Epithelial / HPF 0-5 0 - 5 /HPF   Mucus PRESENT   Fetal fibronectin     Status: Abnormal   Collection Time: 12/03/24  2:04 PM  Result Value Ref Range   Fetal Fibronectin POSITIVE (A) NEGATIVE  Wet prep, genital     Status: Abnormal   Collection Time: 12/03/24  2:04 PM  Result Value Ref Range   Yeast Wet Prep HPF POC NONE SEEN NONE SEEN   Trich, Wet Prep NONE SEEN NONE SEEN   Clue Cells Wet Prep HPF POC PRESENT (A) NONE SEEN   WBC, Wet Prep HPF POC >=10 (A) <10   Sperm NONE SEEN     Imaging:  No results found.  MDM & MAU COURSE  MDM: Moderate  MAU Course: Orders Placed This Encounter  Procedures   Wet prep, genital   Urinalysis, Routine w reflex microscopic -Urine, Clean Catch   Fetal fibronectin   Discharge patient   Meds ordered this encounter  Medications   acetaminophen  (TYLENOL ) tablet 1,000 mg   betamethasone  acetate-betamethasone  sodium phosphate  (CELESTONE ) injection 12 mg   NIFEdipine  (PROCARDIA ) 10 MG capsule    Sig: Take 1 capsule (10 mg total) by mouth as needed (Contractions). Take 1 capsule every 20 minutes as needed  for contractions. If contractions continue after all pills complete, please go to the hospital.    Dispense:  4 capsule    Refill:  1   VSS. Does endorse headache, given tylenol . UA collected to look for signs of dehydration. 135p - UA unremarkable, no ketonuria. Given PO hydration. Pelvic exam completed showing thick, yellow discharge, swabs collected. SVE 1/thick, similar to exam from last MAU visit on 11/16/24.  3:15p - Wet prep showing clue cells, FFN positive. Discussed plan of care with Dr. Alger. Plan for dose of betamethasone  now and in 24 hours. Will also send home with prescription of Procardia . Declines BV treatment at this time as she has done treatment multiple times at this point during pregnancy. Discussed preterm labor precautions. Patient tearful but plan communicated and patient and partner amenable to plan. All questions answered.  4:00p - Betamethasone  given prior to discharge. Follow up with OB on 1/21.   ASSESSMENT   1. Encounter for supervision of normal first pregnancy in third trimester   2. [redacted] weeks gestation of pregnancy   3. Positive fetal fibronectin at 22 weeks to [redacted] weeks gestation   4. False labor before 37 completed weeks of gestation in third trimester     PLAN  Discharge home in stable condition with return precautions.  Follow up on 12/04/24 for second dose of betamethasone .      Follow-up Information     [facility]. Go on 12/04/2024.   Specialty: Obstetrics and Gynecology Why: For second injection of betamethasone  Contact information: [address] [place]  [address] [phone]                Allergies as of 12/03/2024       Reactions   Almond (diagnostic) Other (See Comments)   Unknown all tree nuts    Coconut Flavoring Agent (non-screening) Other (See Comments)   Mouth itches, but no impaired breathing (per patient   Gluten Meal    Septra  [sulfamethoxazole -trimethoprim ] Nausea Only,  Other (See Comments)  Patient took a few courses of this and it ended up being stopped by a MD because it had started causing bad stomach pain (per the patient)        Medication List     STOP taking these medications    acetaminophen -caffeine  500-65 MG Tabs per tablet Commonly known as: EXCEDRIN TENSION HEADACHE   metroNIDAZOLE  0.75 % vaginal gel Commonly known as: METROGEL    nystatin -triamcinolone  cream Commonly known as: MYCOLOG II   promethazine  25 MG tablet Commonly known as: PHENERGAN        TAKE these medications    acetaminophen  500 MG tablet Commonly known as: TYLENOL  Take 1,000 mg by mouth every 6 (six) hours as needed.   aspirin  EC 81 MG tablet Take 1 tablet (81 mg total) by mouth at bedtime. Start taking when you are [redacted] weeks pregnant for rest of pregnancy for prevention of preeclampsia   docusate sodium  100 MG capsule Commonly known as: COLACE Take 100 mg by mouth daily as needed for mild constipation.   EPINEPHrine  0.3 mg/0.3 mL Soaj injection Commonly known as: EPI-PEN Inject 0.3 mg into the muscle as needed for anaphylaxis.   ferrous sulfate  325 (65 FE) MG tablet Take 1 tablet (325 mg total) by mouth daily.   NIFEdipine  10 MG capsule Commonly known as: PROCARDIA  Take 1 capsule (10 mg total) by mouth as needed (Contractions). Take 1 capsule every 20 minutes as needed for contractions. If contractions continue after all pills complete, please go to the hospital.   ondansetron  8 MG disintegrating tablet Commonly known as: ZOFRAN -ODT Take 1 tablet (8 mg total) by mouth every 8 (eight) hours as needed for nausea or vomiting.   ondansetron  8 MG disintegrating tablet Commonly known as: ZOFRAN -ODT Take 0.5 tablets (4 mg total) by mouth every 8 (eight) hours as needed for nausea or vomiting.   prenatal multivitamin Tabs tablet Take 1 tablet by mouth daily at 12 noon.        Charlie Courts, MD  Family Medicine - Obstetrics Fellow         [1]  Social History Tobacco Use   Smoking status: Never   Smokeless tobacco: Never  Vaping Use   Vaping status: Never Used  Substance Use Topics   Alcohol use: Never    Comment: socially   Drug use: Not Currently  [2]  Allergies Allergen Reactions   Almond (Diagnostic) Other (See Comments)    Unknown all tree nuts    Coconut Flavoring Agent (Non-Screening) Other (See Comments)    Mouth itches, but no impaired breathing (per patient   Gluten Meal    Septra  [Sulfamethoxazole -Trimethoprim ] Nausea Only and Other (See Comments)    Patient took a few courses of this and it ended up being stopped by a MD because it had started causing bad stomach pain (per the patient)

## 2024-12-03 NOTE — Discharge Instructions (Signed)
 You came into the hospital because you were having contractions that have been more painful. We did swabs which showed bacterial vaginosis as well as a protein that puts you at higher risk of going into preterm labor. We gave you an injection of a steroid called betamethasone  to help with the baby's lung development in case you do go into preterm labor. You will need to come back to the MAU tomorrow afternoon, 1/16 to receive a second injection. I have also sent a prescription of Procardia  to the pharmacy. If you are having contractions at home, you can take 1 capsule every 20 minutes to stop contractions. If you finish all 4 capsules and continue to have contractions, please come to the MAU for further evaluation.

## 2024-12-03 NOTE — MAU Note (Signed)
 Stephanie [name] is a 29 y.o. at [redacted]w[redacted]d here in MAU reporting: Pt state that she has been experiencing braxton hicks cx's since 26 wks. However, in this past week they have became more intense and woke her out of her sleep last night. Pt called the Nurses Line at 1150 today and was advised to be evaluated at the MAU.  Pt was recently seen at MAU for a subchorionic hemorrhage and was placed on 10 days of pelvic rest and pt states she complied.  In the last two days pt has been notably busier, preparing for upcoming baby shower.   Pt also reports: random sharp pains in her pelvis while walking and standing;  1 episode of clear vaginal discharge today that caused her to change her under garments; Left side HA; new ankle swelling   Onset of complaint: 12/02/2024 Pain score: 6/10 Vitals:   12/03/24 1239  BP: 114/69  Pulse: (!) 104  Resp: 18  Temp: 98.3 F (36.8 C)  SpO2: 100%     FHT: 145 Lab orders placed from triage: UA

## 2024-12-04 ENCOUNTER — Inpatient Hospital Stay (HOSPITAL_COMMUNITY)
Admission: AD | Admit: 2024-12-04 | Discharge: 2024-12-04 | Disposition: A | Discharge status: Home / Self Care | Attending: Obstetrics and Gynecology | Admitting: Obstetrics and Gynecology

## 2024-12-04 ENCOUNTER — Encounter (HOSPITAL_COMMUNITY): Payer: Self-pay | Admitting: Obstetrics and Gynecology

## 2024-12-04 ENCOUNTER — Encounter (HOSPITAL_COMMUNITY): Payer: Self-pay | Admitting: Anesthesiology

## 2024-12-04 ENCOUNTER — Other Ambulatory Visit: Payer: Self-pay

## 2024-12-04 DIAGNOSIS — Z3689 Encounter for other specified antenatal screening: Secondary | ICD-10-CM | POA: Insufficient documentation

## 2024-12-04 DIAGNOSIS — O4703 False labor before 37 completed weeks of gestation, third trimester: Secondary | ICD-10-CM | POA: Insufficient documentation

## 2024-12-04 DIAGNOSIS — O23593 Infection of other part of genital tract in pregnancy, third trimester: Secondary | ICD-10-CM | POA: Diagnosis not present

## 2024-12-04 DIAGNOSIS — Z3403 Encounter for supervision of normal first pregnancy, third trimester: Secondary | ICD-10-CM

## 2024-12-04 DIAGNOSIS — O479 False labor, unspecified: Secondary | ICD-10-CM

## 2024-12-04 DIAGNOSIS — Z3A31 31 weeks gestation of pregnancy: Secondary | ICD-10-CM | POA: Diagnosis not present

## 2024-12-04 DIAGNOSIS — N760 Acute vaginitis: Secondary | ICD-10-CM

## 2024-12-04 LAB — URINALYSIS, ROUTINE W REFLEX MICROSCOPIC
Bilirubin Urine: NEGATIVE
Glucose, UA: NEGATIVE mg/dL
Hgb urine dipstick: NEGATIVE
Ketones, ur: 80 mg/dL — AB
Nitrite: NEGATIVE
Protein, ur: 30 mg/dL — AB
Specific Gravity, Urine: 1.023 (ref 1.005–1.030)
pH: 6 (ref 5.0–8.0)

## 2024-12-04 LAB — GC/CHLAMYDIA PROBE AMP (~~LOC~~) NOT AT ARMC
Chlamydia: NEGATIVE
Comment: NEGATIVE
Comment: NORMAL
Neisseria Gonorrhea: NEGATIVE

## 2024-12-04 MED ORDER — LACTATED RINGERS IV BOLUS
1000.0000 mL | Freq: Once | INTRAVENOUS | Status: AC
  Administered 2024-12-04: 1000 mL via INTRAVENOUS

## 2024-12-04 MED ORDER — AZITHROMYCIN 500 MG PO TABS: 500.0000 mg | ORAL_TABLET | Freq: Two times a day (BID) | ORAL | 0 refills | Status: AC

## 2024-12-04 MED ORDER — FLUCONAZOLE 150 MG PO TABS: 150.0000 mg | ORAL_TABLET | Freq: Every day | ORAL | 1 refills | Status: AC

## 2024-12-04 NOTE — MAU Provider Note (Signed)
 Chief Complaint:  Contractions  HPI   Event Date/Time   First Provider Initiated Contact with Patient 12/04/24 1050     Stephanie [name] is a 29 y.o. G1P0000 at [redacted]w[redacted]d who presents to maternity admissions reporting continued contractions since her discharge yesterday afternoon despite 4 doses of procardia  this morning. Reported she continued to have very intermittent contractions yesterday evening/overnight but they picked up with increasing frequency since 8am despite taking procardia  q12min x4 doses. Now continues to feel painful contractions as well as light-headedness. Denies LOF or DFM, vaginal bleeding or any other physical complaints other than occasional mild nausea (has not eaten yet this morning).   Pregnancy Course: Receives care at [facility], prenatal and MAU records reviewed. FFN positive yesterday.  Past Medical History:  Diagnosis Date   Acute pancreatitis without infection or necrosis 04/21/2019   Anemia    Anxiety 01/28/2013   no meds   Axillary hidradenitis suppurativa 03/05/2019   Celiac disease    Depression    no meds   Endometriosis    Hidradenitis    IBS (irritable bowel syndrome)    Migraines    HA every other day - otc med prn   Recurrent streptococcal tonsillitis    Seasonal allergies    Seasonal environmental allergies   Sebaceous cyst 08/04/2018   L mid back    Vision abnormalities    Myopia; patient has history of devloping ulcers when she wore contacts.   OB History  Gravida Para Term Preterm AB Living  1 0 0 0 0 0  SAB IAB Ectopic Multiple Live Births  0 0 0 0 0    # Outcome Date GA Lbr Len/2nd Weight Sex Type Anes PTL Lv  1 Current             Obstetric Comments  Menstrual age: 42    Age 1st Pregnancy: N/A   Past Surgical History:  Procedure Laterality Date   COLONOSCOPY     IBS   COLONOSCOPY     IBS   IRRIGATION AND DEBRIDEMENT ABSCESS Right 12/07/2021   Procedure: IRRIGATION AND DEBRIDEMENT AXILLARY ABSCESS;  Surgeon: Rubin Calamity, MD;  Location: [facility] OR;  Service: General;  Laterality: Right;   LAPAROSCOPY N/A 10/20/2018   Procedure: LAPAROSCOPY DIAGNOSTIC possible removal of endometriosis;  Surgeon: Dannielle Bouchard, DO;  Location: [facility];  Service: Gynecology;  Laterality: N/A;   UPPER GASTROINTESTINAL ENDOSCOPY  03/04/2020   Family History  Problem Relation Age of Onset   Depression Mother    Migraines Mother    Irritable bowel syndrome Mother    Hypertension Mother    Migraines Father    Diabetes Father    Diabetes Maternal Grandmother    Hypertension Maternal Grandmother    Diabetes Sister    Stomach cancer Sister 20   Colon cancer Neg Hx    Esophageal cancer Neg Hx    Rectal cancer Neg Hx    Social History[1] Allergies[2] No medications prior to admission.   I have reviewed patient's Past Medical Hx, Surgical Hx, Family Hx, Social Hx, medications and allergies.   ROS  Pertinent items noted in HPI and remainder of comprehensive ROS otherwise negative.   PHYSICAL EXAM  Patient Vitals for the past 24 hrs:  BP Temp Temp src Pulse Resp SpO2 Height Weight  12/04/24 1307 113/60 98.8 F (37.1 C) Oral (!) 105 16 100 % -- --  12/04/24 1100 -- -- -- -- -- 100 % -- --  12/04/24 1031 117/65 -- -- MAGNUS  109 -- -- -- --  12/04/24 1030 -- -- -- -- -- 100 % -- --  12/04/24 1024 -- -- -- -- -- -- 5' 3 (1.6 m) 159 lb (72.1 kg)  12/04/24 1015 122/70 98.6 F (37 C) Oral (!) 128 20 97 % -- --   Constitutional: Well-developed, well-nourished female in no acute distress.  Cardiovascular: normal rate & rhythm, warm and well-perfused Respiratory: normal effort, no problems with respiration noted GI: Abd soft, non-tender, non-distended MS: Extremities nontender, no edema, normal ROM Neurologic: Alert and oriented x 4.  GU: no CVA tenderness Pelvic: normal external female genitalia, physiologic discharge, no blood  Dilation: 1 Effacement (%): Thick Cervical Position: Posterior Station: -2 Presentation:  Vertex Exam by:: JINNY Finder, CNM  Fetal Tracing: reactive Baseline: 145 Variability: moderate Accelerations: 15x15 Decelerations: none Toco: q3-36min, then eased up to irregular UI   Labs: Results for orders placed or performed during the hospital encounter of 12/04/24 (from the past 24 hours)  Urinalysis, Routine w reflex microscopic -Urine, Clean Catch     Status: Abnormal   Collection Time: 12/04/24 10:25 AM  Result Value Ref Range   Color, Urine YELLOW YELLOW   APPearance HAZY (A) CLEAR   Specific Gravity, Urine 1.023 1.005 - 1.030   pH 6.0 5.0 - 8.0   Glucose, UA NEGATIVE NEGATIVE mg/dL   Hgb urine dipstick NEGATIVE NEGATIVE   Bilirubin Urine NEGATIVE NEGATIVE   Ketones, ur 80 (A) NEGATIVE mg/dL   Protein, ur 30 (A) NEGATIVE mg/dL   Nitrite NEGATIVE NEGATIVE   Leukocytes,Ua TRACE (A) NEGATIVE   RBC / HPF 0-5 0 - 5 RBC/hpf   WBC, UA 0-5 0 - 5 WBC/hpf   Bacteria, UA RARE (A) NONE SEEN   Squamous Epithelial / HPF 0-5 0 - 5 /HPF   Mucus PRESENT    Imaging:  No results found.  MDM & MAU COURSE  MDM: Moderate  MAU Course: Orders Placed This Encounter  Procedures   Urinalysis, Routine w reflex microscopic -Urine, Clean Catch   Discharge patient   Meds ordered this encounter  Medications   lactated ringers  bolus 1,000 mL   azithromycin  (ZITHROMAX ) 500 MG tablet    Sig: Take 1 tablet (500 mg total) by mouth 2 (two) times daily for 7 days.    Dispense:  14 tablet    Refill:  0   fluconazole  (DIFLUCAN ) 150 MG tablet    Sig: Take 1 tablet (150 mg total) by mouth daily.    Dispense:  1 tablet    Refill:  1   Will not give additional procardia  due to low BP and light-headedness, terbutaline declined since it is likely to make her feel worse LR bolus given which calmed contractions down some, spaced out but still painful when they occur No cervical change with increase in contractions On chart review, pt positive for BV and using metrogel  so FFN likely false  positive Has been struggling with recurrent BV, discussed case with Dr. Alger who agreed pt is unlikely in preterm labor, more likely to be UI related to BV Will treat with azithromycin  since that is the treatment for resistant gardnerella and ureaplasma Reassured patient who then declined 2nd dose of betamethasone   ASSESSMENT   1. Encounter for supervision of normal first pregnancy in third trimester   2. Uterine contractions   3. Recurrent vaginitis   4. NST (non-stress test) reactive   5. [redacted] weeks gestation of pregnancy    PLAN  Discharge home in stable  condition with return precautions.     Follow-up Information     [facility] Follow up.   Specialty: Obstetrics and Gynecology Why: as scheduled for ongoing prenatal care Contact information: [address] [place]  [phone]                Allergies as of 12/04/2024       Reactions   Almond (diagnostic) Other (See Comments)   Unknown all tree nuts    Coconut Flavoring Agent (non-screening) Other (See Comments)   Mouth itches, but no impaired breathing (per patient   Gluten Meal    Septra  [sulfamethoxazole -trimethoprim ] Nausea Only, Other (See Comments)   Patient took a few courses of this and it ended up being stopped by a MD because it had started causing bad stomach pain (per the patient)        Medication List     STOP taking these medications    acetaminophen  500 MG tablet Commonly known as: TYLENOL    docusate sodium  100 MG capsule Commonly known as: COLACE   EPINEPHrine  0.3 mg/0.3 mL Soaj injection Commonly known as: EPI-PEN   ferrous sulfate  325 (65 FE) MG tablet   NIFEdipine  10 MG capsule Commonly known as: PROCARDIA    ondansetron  8 MG disintegrating tablet Commonly known as: ZOFRAN -ODT       TAKE these medications    aspirin  EC 81 MG tablet Take 1 tablet (81 mg total) by mouth at bedtime. Start  taking when you are [redacted] weeks pregnant for rest of pregnancy for prevention of preeclampsia   azithromycin  500 MG tablet Commonly known as: Zithromax  Take 1 tablet (500 mg total) by mouth 2 (two) times daily for 7 days.   fluconazole  150 MG tablet Commonly known as: Diflucan  Take 1 tablet (150 mg total) by mouth daily.   prenatal multivitamin Tabs tablet Take 1 tablet by mouth daily at 12 noon.       Cornell Finder, CNM, MSN, IBCLC Certified Nurse Midwife, [facility]        [1]  Social History Tobacco Use   Smoking status: Never   Smokeless tobacco: Never  Vaping Use   Vaping status: Never Used  Substance Use Topics   Alcohol use: Never    Comment: socially   Drug use: Not Currently  [2]  Allergies Allergen Reactions   Almond (Diagnostic) Other (See Comments)    Unknown all tree nuts    Coconut Flavoring Agent (Non-Screening) Other (See Comments)    Mouth itches, but no impaired breathing (per patient   Gluten Meal    Septra  [Sulfamethoxazole -Trimethoprim ] Nausea Only and Other (See Comments)    Patient took a few courses of this and it ended up being stopped by a MD because it had started causing bad stomach pain (per the patient)

## 2024-12-04 NOTE — MAU Note (Signed)
 Stephanie [name] is a 29 y.o. at [redacted]w[redacted]d here in MAU reporting: she was seen in MAU yesterday for ctxs, sent home with Nifedipine .  Reports ctxs started again this morning are 4-10 minutes apart.  States ctxs have continued despite 4 doses of Nifedipine  this morning, last one taken at 0910.  Reports dark brown discharge, no LOF.  Endorses +FM.  LMP: 04/28/2024 Onset of complaint: today Pain score: 6 Vitals:   12/04/24 1015 12/04/24 1031  BP: 122/70 117/65  Pulse: (!) 128 (!) 109  Resp: 20   Temp: 98.6 F (37 C)   SpO2: 97%      FHT: 145 bpm  Lab orders placed from triage: UA

## 2024-12-09 ENCOUNTER — Ambulatory Visit: Admitting: Certified Nurse Midwife

## 2024-12-09 VITALS — BP 116/75 | HR 103 | Wt 160.0 lb

## 2024-12-09 DIAGNOSIS — O0993 Supervision of high risk pregnancy, unspecified, third trimester: Secondary | ICD-10-CM

## 2024-12-09 DIAGNOSIS — R102 Pelvic and perineal pain unspecified side: Secondary | ICD-10-CM

## 2024-12-09 DIAGNOSIS — Z3A32 32 weeks gestation of pregnancy: Secondary | ICD-10-CM | POA: Diagnosis not present

## 2024-12-09 DIAGNOSIS — O4703 False labor before 37 completed weeks of gestation, third trimester: Secondary | ICD-10-CM

## 2024-12-09 NOTE — Progress Notes (Signed)
 "  PRENATAL VISIT NOTE  Subjective:  Stephanie [name] is a 29 y.o. G1P0000 at [redacted]w[redacted]d being seen today for ongoing prenatal care.  She is currently monitored for the following issues for this low-risk pregnancy and has MDD (major depressive disorder), single episode, severe (HCC); Generalized anxiety disorder; Allergic rhinitis; Migraines; Vaginal bleeding in pregnancy, second trimester (resolved at 11 weeks); Supervision of normal first pregnancy; Acute knee pain; and Subchorionic hematoma in second trimester on their problem list.  Patient reports occasional contractions.  Contractions: Irregular. Vag. Bleeding: None.  Movement: Present. Denies leaking of fluid.   The following portions of the patient's history were reviewed and updated as appropriate: allergies, current medications, past family history, past medical history, past social history, past surgical history and problem list.   Objective:   Vitals:   12/09/24 1438  BP: 116/75  Pulse: (!) 103  Weight: 160 lb (72.6 kg)    Fetal Status:    Fundal Height: 32 cm Movement: Present    General: Alert, oriented and cooperative. Patient is in no acute distress.  Skin: Skin is warm and dry. No rash noted.   Cardiovascular: Normal heart rate noted  Respiratory: Normal respiratory effort, no problems with respiration noted  Abdomen: Soft, gravid, appropriate for gestational age.  Pain/Pressure: Present     Pelvic: Cervical exam deferred        Extremities: Normal range of motion.     Mental Status: Normal mood and affect. Normal behavior. Normal judgment and thought content.      07/08/2024    3:25 PM 05/28/2024   11:11 AM 02/24/2024    2:56 PM  Depression screen PHQ 2/9  Decreased Interest 0 1 0  Down, Depressed, Hopeless 0 1 0  PHQ - 2 Score 0 2 0  Altered sleeping 0 2   Tired, decreased energy 1 1   Change in appetite 0 0   Feeling bad or failure about yourself  0 0   Trouble concentrating 0 2   Moving slowly or  fidgety/restless 0 0   Suicidal thoughts 0 0   PHQ-9 Score 1  7    Difficult doing work/chores Not difficult at all Not difficult at all      Data saved with a previous flowsheet row definition        07/08/2024    3:26 PM 12/25/2023   11:25 AM 05/02/2023    2:49 PM 08/02/2021    8:20 AM  GAD 7 : Generalized Anxiety Score  Nervous, Anxious, on Edge 2  1  2  3    Control/stop worrying 1  1  1  3    Worry too much - different things 1  1  1  3    Trouble relaxing 1  1  1  1    Restless 0  1  1  2    Easily annoyed or irritable 1  2  1  3    Afraid - awful might happen 0  1  0  2   Total GAD 7 Score 6 8 7 17   Anxiety Difficulty Not difficult at all Not difficult at all Not difficult at all Very difficult     Data saved with a previous flowsheet row definition    Assessment and Plan:  Pregnancy: G1P0000 at [redacted]w[redacted]d 1. Supervision of high risk pregnancy in third trimester (Primary) - Patient doing well.  - Reports frequent and vigorous fetal movement   2. [redacted] weeks gestation of pregnancy - Reviewed 3rd trimester expectations.  3. Preterm uterine contractions in third trimester, antepartum - Reviewed worsening signs and return precautions.   4. Pelvic pain - Recommended the use of a maternity support belt or KT tape.  - Discussed pelvic pain as a normal discomfort of pregnancy.   Preterm labor symptoms and general obstetric precautions including but not limited to vaginal bleeding, contractions, leaking of fluid and fetal movement were reviewed in detail with the patient. Please refer to After Visit Summary for other counseling recommendations.   Return in about 2 weeks (around 12/23/2024) for LOB.  Future Appointments  Date Time Provider Department Center  12/23/2024  2:30 PM Emilio Delilah HERO, [place] [facility]  01/06/2025  9:35 AM Letha Renshaw, CNM [facility]  01/12/2025  9:35 AM Fredirick Glenys RAMAN, [place] [facility]    Rilen Shukla Erven) Emilio, MSN, CNM   Center for [facility]  12/09/2024 4:54 PM    "

## 2024-12-11 ENCOUNTER — Telehealth: Payer: Self-pay | Admitting: *Deleted

## 2024-12-11 NOTE — Telephone Encounter (Signed)
 Pt called and left voicemail she had some leaking and was unsure if it was normal. Called patient back and pt only had 2 small episodes has not seen anymore, explained it could just be normal discharge in pregnancy as it gets heavier, but to continue to monitor. Pt also stated she had some intense contractions threw the night that woke her up and this AM I is not feeling baby move as much. Advised to drink something cold and sugary and to lay back down and see if baby starts to move, if not to go straight to the hospital to be evaluated especially with the pain and leaking. Pt verbalizes and understands.

## 2024-12-13 ENCOUNTER — Encounter (HOSPITAL_COMMUNITY): Payer: Self-pay | Admitting: Obstetrics & Gynecology

## 2024-12-13 ENCOUNTER — Inpatient Hospital Stay (HOSPITAL_COMMUNITY)
Admission: AD | Admit: 2024-12-13 | Discharge: 2024-12-13 | Disposition: A | Discharge status: Home / Self Care | Payer: Self-pay | Attending: Obstetrics & Gynecology | Admitting: Obstetrics & Gynecology

## 2024-12-13 ENCOUNTER — Inpatient Hospital Stay (HOSPITAL_COMMUNITY)

## 2024-12-13 DIAGNOSIS — O23593 Infection of other part of genital tract in pregnancy, third trimester: Secondary | ICD-10-CM | POA: Diagnosis not present

## 2024-12-13 DIAGNOSIS — Z3A31 31 weeks gestation of pregnancy: Secondary | ICD-10-CM | POA: Diagnosis not present

## 2024-12-13 DIAGNOSIS — O99343 Other mental disorders complicating pregnancy, third trimester: Secondary | ICD-10-CM | POA: Diagnosis not present

## 2024-12-13 DIAGNOSIS — O368330 Maternal care for abnormalities of the fetal heart rate or rhythm, third trimester, not applicable or unspecified: Secondary | ICD-10-CM | POA: Diagnosis not present

## 2024-12-13 DIAGNOSIS — B9689 Other specified bacterial agents as the cause of diseases classified elsewhere: Secondary | ICD-10-CM | POA: Diagnosis not present

## 2024-12-13 DIAGNOSIS — O212 Late vomiting of pregnancy: Secondary | ICD-10-CM | POA: Insufficient documentation

## 2024-12-13 DIAGNOSIS — F411 Generalized anxiety disorder: Secondary | ICD-10-CM | POA: Insufficient documentation

## 2024-12-13 DIAGNOSIS — O26893 Other specified pregnancy related conditions, third trimester: Secondary | ICD-10-CM | POA: Insufficient documentation

## 2024-12-13 DIAGNOSIS — Z3A32 32 weeks gestation of pregnancy: Secondary | ICD-10-CM | POA: Diagnosis not present

## 2024-12-13 DIAGNOSIS — O99353 Diseases of the nervous system complicating pregnancy, third trimester: Secondary | ICD-10-CM | POA: Insufficient documentation

## 2024-12-13 DIAGNOSIS — O99613 Diseases of the digestive system complicating pregnancy, third trimester: Secondary | ICD-10-CM | POA: Diagnosis not present

## 2024-12-13 DIAGNOSIS — M549 Dorsalgia, unspecified: Secondary | ICD-10-CM | POA: Diagnosis not present

## 2024-12-13 DIAGNOSIS — O4700 False labor before 37 completed weeks of gestation, unspecified trimester: Secondary | ICD-10-CM | POA: Diagnosis not present

## 2024-12-13 DIAGNOSIS — F329 Major depressive disorder, single episode, unspecified: Secondary | ICD-10-CM | POA: Diagnosis not present

## 2024-12-13 LAB — COMPREHENSIVE METABOLIC PANEL WITH GFR
ALT: 14 U/L (ref 0–44)
AST: 20 U/L (ref 15–41)
Albumin: 3.5 g/dL (ref 3.5–5.0)
Alkaline Phosphatase: 57 U/L (ref 38–126)
Anion gap: 10 (ref 5–15)
BUN: 7 mg/dL (ref 6–20)
CO2: 20 mmol/L — ABNORMAL LOW (ref 22–32)
Calcium: 8.8 mg/dL — ABNORMAL LOW (ref 8.9–10.3)
Chloride: 104 mmol/L (ref 98–111)
Creatinine, Ser: 0.58 mg/dL (ref 0.44–1.00)
GFR, Estimated: >60 mL/min
Glucose, Bld: 84 mg/dL (ref 70–99)
Potassium: 3.9 mmol/L (ref 3.5–5.1)
Sodium: 133 mmol/L — ABNORMAL LOW (ref 135–145)
Total Bilirubin: <0.2 mg/dL (ref 0.0–1.2)
Total Protein: 6.5 g/dL (ref 6.5–8.1)

## 2024-12-13 LAB — CBC
HCT: 34.7 % — ABNORMAL LOW (ref 36.0–46.0)
Hemoglobin: 11.9 g/dL — ABNORMAL LOW (ref 12.0–15.0)
MCH: 29.8 pg (ref 26.0–34.0)
MCHC: 34.3 g/dL (ref 30.0–36.0)
MCV: 87 fL (ref 80.0–100.0)
Platelets: 229 10*3/uL (ref 150–400)
RBC: 3.99 MIL/uL (ref 3.87–5.11)
RDW: 14.9 % (ref 11.5–15.5)
WBC: 8 10*3/uL (ref 4.0–10.5)
nRBC: 0 % (ref 0.0–0.2)

## 2024-12-13 LAB — URINALYSIS, ROUTINE W REFLEX MICROSCOPIC
Bilirubin Urine: NEGATIVE
Glucose, UA: NEGATIVE mg/dL
Hgb urine dipstick: NEGATIVE
Ketones, ur: NEGATIVE mg/dL
Nitrite: NEGATIVE
Protein, ur: NEGATIVE mg/dL
Specific Gravity, Urine: 1.011 (ref 1.005–1.030)
pH: 6 (ref 5.0–8.0)

## 2024-12-13 LAB — AMYLASE: Amylase: 94 U/L (ref 28–100)

## 2024-12-13 LAB — LIPASE, BLOOD: Lipase: 28 U/L (ref 11–51)

## 2024-12-13 MED ORDER — SIMETHICONE 80 MG PO CHEW: 80.0000 mg | CHEWABLE_TABLET | Freq: Four times a day (QID) | ORAL | 1 refills | Status: AC | PRN

## 2024-12-13 MED ORDER — SIMETHICONE 80 MG PO CHEW
80.0000 mg | CHEWABLE_TABLET | Freq: Once | ORAL | Status: AC
  Administered 2024-12-13: 80 mg via ORAL
  Filled 2024-12-13: qty 1

## 2024-12-13 MED ORDER — NIFEDIPINE 10 MG PO CAPS: 10.0000 mg | ORAL_CAPSULE | ORAL | Status: DC | PRN

## 2024-12-13 MED ORDER — LACTATED RINGERS IV BOLUS
1000.0000 mL | Freq: Once | INTRAVENOUS | Status: AC
  Administered 2024-12-13: 1000 mL via INTRAVENOUS

## 2024-12-13 MED ORDER — FAMOTIDINE 20 MG PO TABS: 20.0000 mg | ORAL_TABLET | Freq: Two times a day (BID) | ORAL | 2 refills | Status: AC | PRN

## 2024-12-13 MED ORDER — FAMOTIDINE 20 MG PO TABS
20.0000 mg | ORAL_TABLET | Freq: Once | ORAL | Status: AC
  Administered 2024-12-13: 20 mg via ORAL
  Filled 2024-12-13: qty 1

## 2024-12-13 MED ORDER — ACETAMINOPHEN 500 MG PO TABS: 1000.0000 mg | ORAL_TABLET | Freq: Once | ORAL | Status: DC

## 2024-12-13 MED ORDER — TERBUTALINE SULFATE 1 MG/ML IJ SOLN
0.2500 mg | Freq: Once | INTRAMUSCULAR | Status: AC
  Administered 2024-12-13: 0.25 mg via SUBCUTANEOUS
  Filled 2024-12-13: qty 1

## 2024-12-13 NOTE — MAU Note (Signed)
..  Stephanie [name] is a 29 y.o. at [redacted]w[redacted]d here in MAU reporting: contractions since 9 pm, was able to go to sleep but woke up at 0230 with contractions that were more intense.  Is also having upper abdominal pain across her upper abdomen that is sharp and comes and goes. That pain makes it difficult to take a deep breath. Denies vaginal bleeding or leaking of fluid. +FM Was 1cm on 1/15 and given betamethasone .   Pain score: ctx 7/10; sharp upper 8/10 Vitals:   12/13/24 0454  BP: 116/69  Pulse: (!) 107  Resp: 16  Temp: 98.7 F (37.1 C)  SpO2: 100%     FHT:145 Lab orders placed from triage:  UA

## 2024-12-13 NOTE — MAU Provider Note (Addendum)
 Chief Complaint:  Contractions   HPI   Stephanie [name] is a 29 y.o. G1P0000 at [redacted]w[redacted]d who presents to maternity admissions reporting contractions and back pain. She was also seen in the MAU on 1/15 for contractions and received betamethasone  after positive fetal fibronectin. LR bolus given, no cervical change on exam. At that time, chart review revealed patient positive for BV and using Metrogel  for FFN likely false positive. She was treated with azithromycin , treatment of choice for resistant gardnerella and ureaplasma. Declined second dose of betamethasone  after this discussion. She presents today reporting contractions since 2100. They were happening every 10-15 minutes until she was able to fall asleep. They woke her up at 0230 because they were more intense. She notes that now when they happen, she is not able to talk through them. She also notes sharp intermittent 8/10 epigastric pain. The pain make it difficult to breath deeply. She denies any previous epigastric pain like this. Denies vaginal bleeding, leaking of fluid. Endorses good fetal movement.  Pregnancy Course: Receives care at [facility] for [facility] . Prenatal records reviewed. Pregnancy complicated by migraines, MDD, GAD.  Past Medical History:  Diagnosis Date   Acute pancreatitis without infection or necrosis 04/21/2019   Anemia    Anxiety 01/28/2013   no meds   Axillary hidradenitis suppurativa 03/05/2019   Celiac disease    Depression    no meds   Endometriosis    Hidradenitis    IBS (irritable bowel syndrome)    Migraines    HA every other day - otc med prn   Recurrent streptococcal tonsillitis    Seasonal allergies    Seasonal environmental allergies   Sebaceous cyst 08/04/2018   L mid back    Vision abnormalities    Myopia; patient has history of devloping ulcers when she wore contacts.   OB History  Gravida Para Term Preterm AB Living  1 0 0 0 0 0  SAB IAB Ectopic Multiple Live  Births  0 0 0 0 0    # Outcome Date GA Lbr Len/2nd Weight Sex Type Anes PTL Lv  1 Current             Obstetric Comments  Menstrual age: 52    Age 1st Pregnancy: N/A   Past Surgical History:  Procedure Laterality Date   COLONOSCOPY     IBS   COLONOSCOPY     IBS   IRRIGATION AND DEBRIDEMENT ABSCESS Right 12/07/2021   Procedure: IRRIGATION AND DEBRIDEMENT AXILLARY ABSCESS;  Surgeon: Rubin Calamity, MD;  Location: [facility] OR;  Service: General;  Laterality: Right;   LAPAROSCOPY N/A 10/20/2018   Procedure: LAPAROSCOPY DIAGNOSTIC possible removal of endometriosis;  Surgeon: Dannielle Bouchard, DO;  Location: [facility]S;  Service: Gynecology;  Laterality: N/A;   UPPER GASTROINTESTINAL ENDOSCOPY  03/04/2020   Family History  Problem Relation Age of Onset   Depression Mother    Migraines Mother    Irritable bowel syndrome Mother    Hypertension Mother    Migraines Father    Diabetes Father    Diabetes Maternal Grandmother    Hypertension Maternal Grandmother    Diabetes Sister    Stomach cancer Sister 18   Colon cancer Neg Hx    Esophageal cancer Neg Hx    Rectal cancer Neg Hx    Social History[1] Allergies[2] Medications Prior to Admission  Medication Sig Dispense Refill Last Dose/Taking   aspirin  EC 81 MG tablet Take 1 tablet (81 mg total) by  mouth at bedtime. Start taking when you are [redacted] weeks pregnant for rest of pregnancy for prevention of preeclampsia 300 tablet 2 Past Week   fluconazole  (DIFLUCAN ) 150 MG tablet Take 1 tablet (150 mg total) by mouth daily. 1 tablet 1 12/12/2024   Prenatal Vit-Fe Fumarate-FA (PRENATAL MULTIVITAMIN) TABS tablet Take 1 tablet by mouth daily at 12 noon.   Past Week   I have reviewed patient's Past Medical Hx, Surgical Hx, Family Hx, Social Hx, medications and allergies.   ROS  Pertinent items noted in HPI and remainder of comprehensive ROS otherwise negative.   PHYSICAL EXAM  Patient Vitals for the past 24 hrs:  BP Temp Pulse Resp SpO2 Height Weight   12/13/24 0700 111/64 -- 97 -- 100 % -- --  12/13/24 0454 116/69 98.7 F (37.1 C) (!) 107 16 100 % 5' 3 (1.6 m) 72.6 kg    Constitutional: Well-developed, well-nourished female in no acute distress.  HEENT: atraumatic, normocephalic. Neck has normal ROM. EOM intact. Cardiovascular: normal rate & rhythm, warm and well-perfused Respiratory: normal effort, no problems with respiration noted GI: non-tender, non-distended. No Murphy's sign. No rebound or guarding. MSK: Extremities nontender, no edema, normal ROM Skin: warm and dry. Acyanotic, no jaundice or pallor. Neurologic: Alert and oriented x 4. No abnormal coordination. Psychiatric: Normal mood. Speech not slurred, not rapid/pressured. Patient is cooperative. GU: no CVA tenderness Pelvic exam: VULVA: normal appearing vulva with no masses, exam chaperoned by Donnal Stai RN.  Dilation: 1 Effacement (%): Thick Cervical Position: Posterior Station: -2 Presentation: Vertex Exam by:: Joesph Sear PA-C  Fetal Tracing: Baseline FHR: 145 per minute Fetal heart variability: moderate Fetal Heart Rate accelerations: yes Fetal Heart Rate decelerations: none Fetal Non-stress Test: Category I (reactive) Toco: uterine irritability and irregular uterine contractions initially, further spaced out and became even less frequent after IV fluids at 0540  Labs: Results for orders placed or performed during the hospital encounter of 12/13/24 (from the past 24 hours)  Comprehensive metabolic panel with GFR     Status: Abnormal   Collection Time: 12/13/24  5:47 AM  Result Value Ref Range   Sodium 133 (L) 135 - 145 mmol/L   Potassium 3.9 3.5 - 5.1 mmol/L   Chloride 104 98 - 111 mmol/L   CO2 20 (L) 22 - 32 mmol/L   Glucose, Bld 84 70 - 99 mg/dL   BUN 7 6 - 20 mg/dL   Creatinine, Ser 9.41 0.44 - 1.00 mg/dL   Calcium 8.8 (L) 8.9 - 10.3 mg/dL   Total Protein 6.5 6.5 - 8.1 g/dL   Albumin 3.5 3.5 - 5.0 g/dL   AST 20 15 - 41 U/L   ALT 14 0 - 44  U/L   Alkaline Phosphatase 57 38 - 126 U/L   Total Bilirubin <0.2 0.0 - 1.2 mg/dL   GFR, Estimated >39 >39 mL/min   Anion gap 10 5 - 15  Lipase, blood     Status: None   Collection Time: 12/13/24  5:47 AM  Result Value Ref Range   Lipase 28 11 - 51 U/L  Amylase     Status: None   Collection Time: 12/13/24  5:47 AM  Result Value Ref Range   Amylase 94 28 - 100 U/L  Urinalysis, Routine w reflex microscopic -Urine, Clean Catch     Status: Abnormal   Collection Time: 12/13/24  5:49 AM  Result Value Ref Range   Color, Urine YELLOW YELLOW   APPearance CLEAR CLEAR  Specific Gravity, Urine 1.011 1.005 - 1.030   pH 6.0 5.0 - 8.0   Glucose, UA NEGATIVE NEGATIVE mg/dL   Hgb urine dipstick NEGATIVE NEGATIVE   Bilirubin Urine NEGATIVE NEGATIVE   Ketones, ur NEGATIVE NEGATIVE mg/dL   Protein, ur NEGATIVE NEGATIVE mg/dL   Nitrite NEGATIVE NEGATIVE   Leukocytes,Ua SMALL (A) NEGATIVE   RBC / HPF 0-5 0 - 5 RBC/hpf   WBC, UA 0-5 0 - 5 WBC/hpf   Bacteria, UA RARE (A) NONE SEEN   Squamous Epithelial / HPF 0-5 0 - 5 /HPF   Mucus PRESENT   CBC     Status: Abnormal   Collection Time: 12/13/24  6:37 AM  Result Value Ref Range   WBC 8.0 4.0 - 10.5 K/uL   RBC 3.99 3.87 - 5.11 MIL/uL   Hemoglobin 11.9 (L) 12.0 - 15.0 g/dL   HCT 65.2 (L) 63.9 - 53.9 %   MCV 87.0 80.0 - 100.0 fL   MCH 29.8 26.0 - 34.0 pg   MCHC 34.3 30.0 - 36.0 g/dL   RDW 85.0 88.4 - 84.4 %   Platelets 229 150 - 400 K/uL   nRBC 0.0 0.0 - 0.2 %   Imaging:  US  ABDOMEN LIMITED RUQ (LIVER/GB) Result Date: 12/13/2024 CLINICAL DATA:  Epigastric pain during pregnancy. Right upper quadrant pain. Nausea vomiting. EXAM: ULTRASOUND ABDOMEN LIMITED RIGHT UPPER QUADRANT COMPARISON:  None Available. FINDINGS: Gallbladder: No gallstones or wall thickening visualized. No sonographic Murphy sign noted by sonographer. Common bile duct: Diameter: 3 mm Liver: No focal lesion identified. Within normal limits in parenchymal echogenicity. Portal vein is  patent on color Doppler imaging with normal direction of blood flow towards the liver. Other: None. IMPRESSION: Unremarkable right upper quadrant ultrasound. Electronically Signed   By: Camellia Candle M.D.   On: 12/13/2024 06:58    MDM & MAU COURSE  MDM: High  MAU Course: -Vital signs within normal limits. Normotensive so no preeclampsia. Afebrile. -CMP, lipase/amylase to rule out GI causes of epigastric pain. CBC to rule out infection. -UA for hydration status. -IV fluids for irregular contractions. -IV Pepcid  trial for possible gastritis. -No cervical dilation on exam. -After 500 mL IV fluid, patient reports contractions have become less frequent and less strong. No change in upper abdominal pain with fluids and Pepcid . Will order RUQ US  to rule out hepatobiliary disease. -UA negative for dehydration. Small leukocytes and rare bacteria, will send for culture. -CMP without significant abnormalities. Lipase/amylase within normal limits. -US  negative for pathology. -Patient reports upper abdominal pain has decreased and become less frequent. She reports it feels like gas pains and would like to try medicine to relieve gas. Will order simethicone . -After returning from US , uterine irritability returned as it was when patient first arrived. Discussed tocolysis. Patient reports that nifedipine  previously did not offer any relief from contractions and caused headache, dizziness. Also discussed with Dr. Jayne who recommended terbutaline .  -Reviewed options of nifedipine  and terbutaline  for tocolysis with the patient. She does not want to repeat nifedipine  due to it being ineffective and causing side effects last time. She expressed feeling anxious that terbutaline  would also cause side effects and make her feel bad. Discussed that terbutaline  has a short half life, so side effects (most frequently feeling jittery) resolves after about 10 minutes. Patient agreeable to terbutaline  for contractions. -Care  taken over by Ademide Schaberg  Claudene CNM   Joesph Sear PA-C  0800: Stephanie [name]  Claudene, CNM assumed care of patient.  Contractions improving after terbutaline .  Will observe for 2 hours and recheck cervix.  1000: Patient reports that contractions are much less frequent and stronger than when she arrived.  Cervix rechecked, unchanged 1/0/ballotable.  Differential diagnosis considered for upper abdominal pain includes but is not limited to: cholecystitis, biliary colic, preeclampsia, appendicitis, abdominal hernia, constipation, pancreatitis Differential diagnosis considered for lower abdominal pain includes but is not limited to: labor, round ligament pain, UTI, pyelonephritis, PID, cervicitis, chorioamnionitis, appendicitis, diverticulitis, constipation, nephrolithiasis, inguinal hernia, small bowel obstruction   Orders Placed This Encounter  Procedures   Culture, OB Urine   US  ABDOMEN LIMITED RUQ (LIVER/GB)   Urinalysis, Routine w reflex microscopic -Urine, Clean Catch   Comprehensive metabolic panel with GFR   Lipase, blood   Amylase   CBC   Meds ordered this encounter  Medications   lactated ringers  bolus 1,000 mL   famotidine  (PEPCID ) tablet 20 mg   DISCONTD: acetaminophen  (TYLENOL ) tablet 1,000 mg   simethicone  (MYLICON) chewable tablet 80 mg   DISCONTD: NIFEdipine  (PROCARDIA ) capsule 10 mg   terbutaline  (BRETHINE ) injection 0.25 mg    MDM -32 weeks 5 days with preterm contractions but no cervical change since MAU visit 12/03/2024.  No cervical change after 4-1/2-hour observation in MAU today.  Status-post betamethasone  x 1.  Second betamethasone  dose declined due to suspected false positive fetal fibronectin.  Being treated for recurrent BV.  UTI unlikely per UA but will send culture due to ongoing contractions.  Discussed further observation versus discharge with patient and her partner.  They feel comfortable with discharge at this time.  Encouraged patient to return if contractions are  worse than they were on presentation to day or if there is any other indication of progressing labor, broken water, vaginal bleeding or decreased fetal movement.  Will move prenatal visit to sooner date for close follow-up.  Encouraged patient to increase fluids and rest.  Will follow urine culture and treat as needed.  Discussed with Dr. Izell who agrees with plan of care.  -Upper abdominal pain that resolved with Pepcid  and simethicone .  Suspect gas-related.  Liver enzymes WBC, amylase, lipase, right upper quadrant ultrasound normal.  No evidence of gallstones cholecystitis, pancreatitis, hepatitis pregnancy emergencies.  Rx Pepcid  and simethicone  as needed.    ASSESSMENT   1. Preterm contractions   2. Epigastric abdominal pain affecting pregnancy in third trimester   3. [redacted] weeks gestation of pregnancy     PLAN  Discharged home in stable condition. Preterm labor contractions and right upper quadrant pain contractions. Message sent to [facility] to move follow-up appointment sooner.  Increase fluids and rest.  Follow-up Information     [facility] at [facility] Follow up.   Specialty: Obstetrics and Gynecology Why: Will call you to reschedule earlier prenatal visit Contact information: [address] [place]  [phone]        [facility] Follow up.   Specialty: Obstetrics and Gynecology Why: As needed if symptoms worsen, As needed in emergencies Contact information: [address] [place]  [phone]                 Medication List     TAKE these medications    aspirin  EC 81 MG tablet Take 1 tablet (81 mg total) by mouth at bedtime. Start taking when you are [redacted] weeks pregnant for rest of pregnancy for prevention of preeclampsia   famotidine  20 MG tablet Commonly known as:  PEPCID  Take 1 tablet (20 mg total) by mouth 2 (two) times daily as  needed for heartburn or indigestion.   fluconazole  150 MG tablet Commonly known as: Diflucan  Take 1 tablet (150 mg total) by mouth daily.   prenatal multivitamin Tabs tablet Take 1 tablet by mouth daily at 12 noon.   simethicone  80 MG chewable tablet Commonly known as: MYLICON Chew 1 tablet (80 mg total) by mouth every 6 (six) hours as needed (Gas pain).        Arnelle Nale  Claudene HOWARD 12/13/2024 10:25 AM       [1]  Social History Tobacco Use   Smoking status: Never   Smokeless tobacco: Never  Vaping Use   Vaping status: Never Used  Substance Use Topics   Alcohol use: Never    Comment: socially   Drug use: Not Currently  [2]  Allergies Allergen Reactions   Almond (Diagnostic) Other (See Comments)    Unknown all tree nuts    Coconut Flavoring Agent (Non-Screening) Other (See Comments)    Mouth itches, but no impaired breathing (per patient   Gluten Meal    Septra  [Sulfamethoxazole -Trimethoprim ] Nausea Only and Other (See Comments)    Patient took a few courses of this and it ended up being stopped by a MD because it had started causing bad stomach pain (per the patient)

## 2024-12-13 NOTE — Discharge Instructions (Signed)

## 2024-12-14 LAB — CULTURE, OB URINE: Culture: NO GROWTH

## 2024-12-23 ENCOUNTER — Ambulatory Visit: Admitting: Certified Nurse Midwife

## 2024-12-23 VITALS — BP 117/74 | HR 109 | Wt 159.0 lb

## 2024-12-23 DIAGNOSIS — O4700 False labor before 37 completed weeks of gestation, unspecified trimester: Secondary | ICD-10-CM

## 2024-12-23 DIAGNOSIS — O4703 False labor before 37 completed weeks of gestation, third trimester: Secondary | ICD-10-CM | POA: Diagnosis not present

## 2024-12-23 DIAGNOSIS — Z3493 Encounter for supervision of normal pregnancy, unspecified, third trimester: Secondary | ICD-10-CM

## 2024-12-23 DIAGNOSIS — Z3A34 34 weeks gestation of pregnancy: Secondary | ICD-10-CM | POA: Diagnosis not present

## 2024-12-23 NOTE — Progress Notes (Unsigned)
 "  PRENATAL VISIT NOTE  Subjective:  Stephanie [name] is a 29 y.o. G1P0000 at [redacted]w[redacted]d being seen today for ongoing prenatal care.  She is currently monitored for the following issues for this {Blank single:19197::high-risk,low-risk} pregnancy and has MDD (major depressive disorder), single episode, severe (HCC); Generalized anxiety disorder; Allergic rhinitis; Migraines; Vaginal bleeding in pregnancy, second trimester (resolved at 11 weeks); Supervision of normal first pregnancy; and Subchorionic hematoma in second trimester on their problem list.  Patient reports {sx:14538}.  Contractions: Irregular. Vag. Bleeding: None.  Movement: Present. Denies leaking of fluid.   The following portions of the patient's history were reviewed and updated as appropriate: allergies, current medications, past family history, past medical history, past social history, past surgical history and problem list.   Objective:   Vitals:   12/23/24 1451  BP: 117/74  Pulse: (!) 109  Weight: 159 lb (72.1 kg)    Fetal Status:      Movement: Present    General: Alert, oriented and cooperative. Patient is in no acute distress.  Skin: Skin is warm and dry. No rash noted.   Cardiovascular: Normal heart rate noted  Respiratory: Normal respiratory effort, no problems with respiration noted  Abdomen: Soft, gravid, appropriate for gestational age.  Pain/Pressure: Present     Pelvic: {Blank single:19197::Cervical exam performed in the presence of a chaperone,Cervical exam deferred}        Extremities: Normal range of motion.     Mental Status: Normal mood and affect. Normal behavior. Normal judgment and thought content.      07/08/2024    3:25 PM 05/28/2024   11:11 AM 02/24/2024    2:56 PM  Depression screen PHQ 2/9  Decreased Interest 0 1 0  Down, Depressed, Hopeless 0 1 0  PHQ - 2 Score 0 2 0  Altered sleeping 0 2   Tired, decreased energy 1 1   Change in appetite 0 0   Feeling bad or failure about  yourself  0 0   Trouble concentrating 0 2   Moving slowly or fidgety/restless 0 0   Suicidal thoughts 0 0   PHQ-9 Score 1  7    Difficult doing work/chores Not difficult at all Not difficult at all      Data saved with a previous flowsheet row definition        07/08/2024    3:26 PM 12/25/2023   11:25 AM 05/02/2023    2:49 PM 08/02/2021    8:20 AM  GAD 7 : Generalized Anxiety Score  Nervous, Anxious, on Edge 2  1  2  3    Control/stop worrying 1  1  1  3    Worry too much - different things 1  1  1  3    Trouble relaxing 1  1  1  1    Restless 0  1  1  2    Easily annoyed or irritable 1  2  1  3    Afraid - awful might happen 0  1  0  2   Total GAD 7 Score 6 8 7 17   Anxiety Difficulty Not difficult at all Not difficult at all Not difficult at all Very difficult     Data saved with a previous flowsheet row definition    Assessment and Plan:  Pregnancy: G1P0000 at [redacted]w[redacted]d 1. Encounter for supervision of low-risk pregnancy in third trimester (Primary) ***  2. [redacted] weeks gestation of pregnancy ***  3. Preterm contractions ***  {Blank single:19197::Term,Preterm} labor symptoms and  general obstetric precautions including but not limited to vaginal bleeding, contractions, leaking of fluid and fetal movement were reviewed in detail with the patient. Please refer to After Visit Summary for other counseling recommendations.   Return in about 2 weeks (around 01/06/2025) for LOB.  Future Appointments  Date Time Provider Department Center  01/06/2025  9:35 AM Letha Renshaw, CNM [facility]  01/12/2025  9:35 AM Fredirick Glenys RAMAN, MD [facility]    Zaccheaus Storlie Erven) Emilio, MSN, CNM  Center for [facility]  12/23/2024 3:40 PM     "

## 2025-01-06 ENCOUNTER — Encounter: Admitting: Obstetrics and Gynecology

## 2025-01-12 ENCOUNTER — Encounter: Admitting: Family Medicine

## 2025-01-20 ENCOUNTER — Encounter: Admitting: Certified Nurse Midwife

## 2025-01-28 ENCOUNTER — Encounter: Admitting: Obstetrics & Gynecology

## 2025-02-02 ENCOUNTER — Encounter: Admitting: Obstetrics and Gynecology
